# Patient Record
Sex: Female | Born: 1970
Health system: Southern US, Community
[De-identification: ages and names within clinical notes are randomized; demographics above are authoritative.]

## PROBLEM LIST (undated history)

## (undated) DIAGNOSIS — I10 Essential (primary) hypertension: Secondary | ICD-10-CM

## (undated) DIAGNOSIS — D649 Anemia, unspecified: Secondary | ICD-10-CM

## (undated) DIAGNOSIS — K5190 Ulcerative colitis, unspecified, without complications: Secondary | ICD-10-CM

## (undated) DIAGNOSIS — I214 Non-ST elevation (NSTEMI) myocardial infarction: Secondary | ICD-10-CM

## (undated) DIAGNOSIS — E119 Type 2 diabetes mellitus without complications: Secondary | ICD-10-CM

## (undated) HISTORY — DX: Ulcerative colitis, unspecified, without complications: K51.90

## (undated) HISTORY — DX: Anemia, unspecified: D64.9

## (undated) HISTORY — DX: Type 2 diabetes mellitus without complications: E11.9

## (undated) HISTORY — DX: Essential (primary) hypertension: I10

## (undated) HISTORY — DX: Non-ST elevation (NSTEMI) myocardial infarction: I21.4

---

## 2015-08-11 ENCOUNTER — Institutional Professional Consult (permissible substitution): Payer: BLUE CROSS/BLUE SHIELD | Admitting: Family Medicine

## 2015-08-12 ENCOUNTER — Encounter: Payer: Self-pay | Admitting: Family Medicine

## 2015-08-12 ENCOUNTER — Other Ambulatory Visit: Payer: Self-pay | Admitting: Family Medicine

## 2015-08-12 ENCOUNTER — Ambulatory Visit (INDEPENDENT_AMBULATORY_CARE_PROVIDER_SITE_OTHER): Payer: BLUE CROSS/BLUE SHIELD | Admitting: Family Medicine

## 2015-08-12 VITALS — BP 124/84 | HR 68 | Ht 62.0 in | Wt 238.4 lb

## 2015-08-12 DIAGNOSIS — R350 Frequency of micturition: Secondary | ICD-10-CM | POA: Diagnosis not present

## 2015-08-12 DIAGNOSIS — E119 Type 2 diabetes mellitus without complications: Secondary | ICD-10-CM | POA: Diagnosis not present

## 2015-08-12 DIAGNOSIS — Z7689 Persons encountering health services in other specified circumstances: Secondary | ICD-10-CM

## 2015-08-12 DIAGNOSIS — R5383 Other fatigue: Secondary | ICD-10-CM

## 2015-08-12 DIAGNOSIS — Z7189 Other specified counseling: Secondary | ICD-10-CM | POA: Diagnosis not present

## 2015-08-12 DIAGNOSIS — I10 Essential (primary) hypertension: Secondary | ICD-10-CM | POA: Diagnosis not present

## 2015-08-12 DIAGNOSIS — Z862 Personal history of diseases of the blood and blood-forming organs and certain disorders involving the immune mechanism: Secondary | ICD-10-CM | POA: Diagnosis not present

## 2015-08-12 DIAGNOSIS — R7303 Prediabetes: Secondary | ICD-10-CM | POA: Insufficient documentation

## 2015-08-12 LAB — CBC WITH DIFFERENTIAL/PLATELET
BASOS ABS: 0.1 10*3/uL (ref 0.0–0.1)
Basophils Relative: 1 % (ref 0–1)
EOS PCT: 3 % (ref 0–5)
Eosinophils Absolute: 0.3 10*3/uL (ref 0.0–0.7)
HEMATOCRIT: 37.8 % (ref 36.0–46.0)
Hemoglobin: 12 g/dL (ref 12.0–15.0)
LYMPHS ABS: 2.7 10*3/uL (ref 0.7–4.0)
LYMPHS PCT: 32 % (ref 12–46)
MCH: 27 pg (ref 26.0–34.0)
MCHC: 31.7 g/dL (ref 30.0–36.0)
MCV: 85.1 fL (ref 78.0–100.0)
MPV: 11.6 fL (ref 8.6–12.4)
Monocytes Absolute: 0.8 10*3/uL (ref 0.1–1.0)
Monocytes Relative: 9 % (ref 3–12)
NEUTROS ABS: 4.6 10*3/uL (ref 1.7–7.7)
NEUTROS PCT: 55 % (ref 43–77)
Platelets: 427 10*3/uL — ABNORMAL HIGH (ref 150–400)
RBC: 4.44 MIL/uL (ref 3.87–5.11)
RDW: 15.8 % — AB (ref 11.5–15.5)
WBC: 8.4 10*3/uL (ref 4.0–10.5)

## 2015-08-12 LAB — POCT URINALYSIS DIPSTICK
Bilirubin, UA: NEGATIVE
Glucose, UA: NEGATIVE
KETONES UA: NEGATIVE
Leukocytes, UA: NEGATIVE
Nitrite, UA: NEGATIVE
PH UA: 6
PROTEIN UA: NEGATIVE
RBC UA: NEGATIVE
UROBILINOGEN UA: NEGATIVE

## 2015-08-12 LAB — COMPREHENSIVE METABOLIC PANEL
ALK PHOS: 57 U/L (ref 33–115)
ALT: 22 U/L (ref 6–29)
AST: 20 U/L (ref 10–30)
Albumin: 3.9 g/dL (ref 3.6–5.1)
BILIRUBIN TOTAL: 0.3 mg/dL (ref 0.2–1.2)
BUN: 17 mg/dL (ref 7–25)
CALCIUM: 9.4 mg/dL (ref 8.6–10.2)
CO2: 24 mmol/L (ref 20–31)
CREATININE: 0.76 mg/dL (ref 0.50–1.10)
Chloride: 104 mmol/L (ref 98–110)
Glucose, Bld: 163 mg/dL — ABNORMAL HIGH (ref 65–99)
Potassium: 3.9 mmol/L (ref 3.5–5.3)
Sodium: 137 mmol/L (ref 135–146)
TOTAL PROTEIN: 7.4 g/dL (ref 6.1–8.1)

## 2015-08-12 LAB — POCT UA - MICROALBUMIN
Creatinine, POC: 87.5 mg/dL
MICROALBUMIN (UR) POC: 7 mg/L

## 2015-08-12 LAB — TSH: TSH: 1.9 m[IU]/L

## 2015-08-12 LAB — POCT GLYCOSYLATED HEMOGLOBIN (HGB A1C): Hemoglobin A1C: 7

## 2015-08-12 MED ORDER — METFORMIN HCL 500 MG PO TABS: 500.0000 mg | ORAL_TABLET | Freq: Two times a day (BID) | ORAL | Status: DC

## 2015-08-12 NOTE — Patient Instructions (Signed)
Basic Carbohydrate Counting for Diabetes Mellitus Carbohydrate counting is a method for keeping track of the amount of carbohydrates you eat. Eating carbohydrates naturally increases the level of sugar (glucose) in your blood, so it is important for you to know the amount that is okay for you to have in every meal. Carbohydrate counting helps keep the level of glucose in your blood within normal limits. The amount of carbohydrates allowed is different for every person. A dietitian can help you calculate the amount that is right for you. Once you know the amount of carbohydrates you can have, you can count the carbohydrates in the foods you want to eat. Carbohydrates are found in the following foods:  Grains, such as breads and cereals.  Dried beans and soy products.  Starchy vegetables, such as potatoes, peas, and corn.  Fruit and fruit juices.  Milk and yogurt.  Sweets and snack foods, such as cake, cookies, candy, chips, soft drinks, and fruit drinks. CARBOHYDRATE COUNTING There are two ways to count the carbohydrates in your food. You can use either of the methods or a combination of both. Reading the "Nutrition Facts" on Packaged Food The "Nutrition Facts" is an area that is included on the labels of almost all packaged food and beverages in the [place]. It includes the serving size of that food or beverage and information about the nutrients in each serving of the food, including the grams (g) of carbohydrate per serving.  Decide the number of servings of this food or beverage that you will be able to eat or drink. Multiply that number of servings by the number of grams of carbohydrate that is listed on the label for that serving. The total will be the amount of carbohydrates you will be having when you eat or drink this food or beverage. Learning Standard Serving Sizes of Food When you eat food that is not packaged or does not include "Nutrition Facts" on the label, you need to  measure the servings in order to count the amount of carbohydrates.A serving of most carbohydrate-rich foods contains about 15 g of carbohydrates. The following list includes serving sizes of carbohydrate-rich foods that provide 15 g ofcarbohydrate per serving:   1 slice of bread (1 oz) or 1 six-inch tortilla.    of a hamburger bun or English muffin.  4-6 crackers.   cup unsweetened dry cereal.    cup hot cereal.   cup rice or pasta.    cup mashed potatoes or  of a large baked potato.  1 cup fresh fruit or one small piece of fruit.    cup canned or frozen fruit or fruit juice.  1 cup milk.   cup plain fat-free yogurt or yogurt sweetened with artificial sweeteners.   cup cooked dried beans or starchy vegetable, such as peas, corn, or potatoes.  Decide the number of standard-size servings that you will eat. Multiply that number of servings by 15 (the grams of carbohydrates in that serving). For example, if you eat 2 cups of strawberries, you will have eaten 2 servings and 30 g of carbohydrates (2 servings x 15 g = 30 g). For foods such as soups and casseroles, in which more than one food is mixed in, you will need to count the carbohydrates in each food that is included. EXAMPLE OF CARBOHYDRATE COUNTING Sample Dinner  3 oz chicken breast.   cup of brown rice.   cup of corn.  1 cup milk.   1 cup strawberries with   sugar-free whipped topping.  Carbohydrate Calculation Step 1: Identify the foods that contain carbohydrates:   Rice.   Corn.   Milk.   Strawberries. Step 2:Calculate the number of servings eaten of each:   2 servings of rice.   1 serving of corn.   1 serving of milk.   1 serving of strawberries. Step 3: Multiply each of those number of servings by 15 g:   2 servings of rice x 15 g = 30 g.   1 serving of corn x 15 g = 15 g.   1 serving of milk x 15 g = 15 g.   1 serving of strawberries x 15 g = 15 g. Step 4: Add  together all of the amounts to find the total grams of carbohydrates eaten: 30 g + 15 g + 15 g + 15 g = 75 g.   This information is not intended to replace advice given to you by your health care provider. Make sure you discuss any questions you have with your health care provider.   Document Released: 05/29/2005 Document Revised: 06/19/2014 Document Reviewed: 04/25/2013 Elsevier Interactive Patient Education 2016 Elsevier Inc. Diabetes and Exercise Exercising regularly is important. It is not just about losing weight. It has many health benefits, such as:  Improving your overall fitness, flexibility, and endurance.  Increasing your bone density.  Helping with weight control.  Decreasing your body fat.  Increasing your muscle strength.  Reducing stress and tension.  Improving your overall health. People with diabetes who exercise gain additional benefits because exercise:  Reduces appetite.  Improves the body's use of blood sugar (glucose).  Helps lower or control blood glucose.  Decreases blood pressure.  Helps control blood lipids (such as cholesterol and triglycerides).  Improves the body's use of the hormone insulin by:  Increasing the body's insulin sensitivity.  Reducing the body's insulin needs.  Decreases the risk for heart disease because exercising:  Lowers cholesterol and triglycerides levels.  Increases the levels of good cholesterol (such as high-density lipoproteins [HDL]) in the body.  Lowers blood glucose levels. YOUR ACTIVITY PLAN  Choose an activity that you enjoy, and set realistic goals. To exercise safely, you should begin practicing any new physical activity slowly, and gradually increase the intensity of the exercise over time. Your health care provider or diabetes educator can help create an activity plan that works for you. General recommendations include:  Encouraging children to engage in at least 60 minutes of physical activity each  day.  Stretching and performing strength training exercises, such as yoga or weight lifting, at least 2 times per week.  Performing a total of at least 150 minutes of moderate-intensity exercise each week, such as brisk walking or water aerobics.  Exercising at least 3 days per week, making sure you allow no more than 2 consecutive days to pass without exercising.  Avoiding long periods of inactivity (90 minutes or more). When you have to spend an extended period of time sitting down, take frequent breaks to walk or stretch. RECOMMENDATIONS FOR EXERCISING WITH TYPE 1 OR TYPE 2 DIABETES   Check your blood glucose before exercising. If blood glucose levels are greater than 240 mg/dL, check for urine ketones. Do not exercise if ketones are present.  Avoid injecting insulin into areas of the body that are going to be exercised. For example, avoid injecting insulin into:  The arms when playing tennis.  The legs when jogging.  Keep a record of:  Food intake before   and after you exercise.  Expected peak times of insulin action.  Blood glucose levels before and after you exercise.  The type and amount of exercise you have done.  Review your records with your health care provider. Your health care provider will help you to develop guidelines for adjusting food intake and insulin amounts before and after exercising.  If you take insulin or oral hypoglycemic agents, watch for signs and symptoms of hypoglycemia. They include:  Dizziness.  Shaking.  Sweating.  Chills.  Confusion.  Drink plenty of water while you exercise to prevent dehydration or heat stroke. Body water is lost during exercise and must be replaced.  Talk to your health care provider before starting an exercise program to make sure it is safe for you. Remember, almost any type of activity is better than none.   This information is not intended to replace advice given to you by your health care provider. Make sure you  discuss any questions you have with your health care provider.   Document Released: 08/19/2003 Document Revised: 10/13/2014 Document Reviewed: 11/05/2012 Elsevier Interactive Patient Education 2016 Elsevier Inc.  

## 2015-08-12 NOTE — Progress Notes (Signed)
   Subjective:    Patient ID: Courtney Stewart, female    DOB: 01-19-1971, 45 y.o.   MRN: [id number]  HPI Chief Complaint  Patient presents with  . new pt    new pt. was told she had pre-diabetes. urinary frquency, jitterly if don't eat.    She is new to the practice and here to establish care. Last saw her PCP in November 2016 in [place]. She moved here from [facility].  Pre- diabetes for past 24 years. Was told in November that she had an elevated A1C.  Complains of urinary frequency and increased thirst and feeling tired for past 2 months.  Diet: eats a lot of sweets and carbohydrates. No exercise.   Diagnosed with HTN in 2014 and is taking Lisinopril daily.   She states she is a vegetarian. Has history of anemia. She eats out often 2-3 times per week.  Ulcerative colitis- diagnosed in 2015 by GI in [place].   Surgeries: C-secton.   Last pap smear: 2009 overdue.  Reports regular periods- comes on every 28 days.  Bleeds for 2 days then spots for 3-4 days.   Mammogram: never.  Colonoscopy: 2015   Family history: mother deceased and had HTN, DM, hyperlipidemia; father deceased from colon cancer at age 35. 3 brothers all healthy.    Works as a Conservation officer, nature at [employer].  Lives with daughter. She is 45 years old.   Reviewed allergies, medications, past medical, surgical, family, and social history.    Review of Systems Pertinent positives and negatives in the history of present illness.     Objective:   Physical Exam BP 124/84 mmHg  Pulse 68  Ht  (1.575 m)  Wt 238 lb 6.4 oz (108.138 kg)  BMI 43.59 kg/m2  LMP 07/28/2015  Alert and in no distress.  Cardiac exam shows a regular sinus rhythm without murmurs or gallops. Lungs are clear to auscultation.  A1c 7.0     Assessment & Plan:  Diabetes mellitus without complication (HCC) - Plan: CBC with Differential/Platelet, Comprehensive metabolic panel, Amb ref to Medical Nutrition Therapy-MNT  Morbid obesity,  unspecified obesity type (HCC) - Plan: Comprehensive metabolic panel, TSH, Amb ref to Medical Nutrition Therapy-MNT  Encounter to establish care  Essential hypertension - Plan: CBC with Differential/Platelet, Comprehensive metabolic panel, Amb ref to Medical Nutrition Therapy-MNT  History of anemia - Plan: CBC with Differential/Platelet  Other fatigue - Plan: TSH  Urinary frequency - Plan: POCT urinalysis dipstick  Discussed diabetes spectrum and that she now needs to make lifestyle changes in order to prevent the progression of the disease and to prevent heart disease, blindness, kidney disease, stroke, etc. She appear to be motivated to make changes and agrees to go for nutritional counseling. Recommend physical activity such as walking and to start slow and gradually increase in pace and duration. Nutrition referral will be beneficial for obesity, HTN and diabetes.  Discussed multiple differentials for fatigue and will rule out underlying etiologies such as anemia or thyroid dysfunction.  Discussed that her urine is negative for infection and her urinary frequency may be related to elevated glucose. Prescription sent for Metformin bid and discussed possible side effects such as diarrhea or nausea that should subside in a couple of weeks.  She will follow up in 2 weeks or sooner if needed pending labs.

## 2015-08-18 ENCOUNTER — Encounter: Payer: Self-pay | Admitting: Internal Medicine

## 2015-08-24 ENCOUNTER — Encounter: Payer: Self-pay | Admitting: Family Medicine

## 2015-08-24 ENCOUNTER — Ambulatory Visit (INDEPENDENT_AMBULATORY_CARE_PROVIDER_SITE_OTHER): Payer: BLUE CROSS/BLUE SHIELD | Admitting: Family Medicine

## 2015-08-24 ENCOUNTER — Other Ambulatory Visit: Payer: Self-pay | Admitting: Family Medicine

## 2015-08-24 ENCOUNTER — Encounter: Payer: Self-pay | Admitting: Internal Medicine

## 2015-08-24 VITALS — BP 138/90 | HR 64 | Wt 237.4 lb

## 2015-08-24 DIAGNOSIS — I10 Essential (primary) hypertension: Secondary | ICD-10-CM | POA: Diagnosis not present

## 2015-08-24 DIAGNOSIS — E119 Type 2 diabetes mellitus without complications: Secondary | ICD-10-CM | POA: Diagnosis not present

## 2015-08-24 MED ORDER — ONETOUCH DELICA LANCETS FINE MISC: Status: DC

## 2015-08-24 MED ORDER — GLUCOSE BLOOD VI STRP: ORAL_STRIP | Status: AC

## 2015-08-24 NOTE — Progress Notes (Signed)
   Subjective:    Patient ID: Courtney Stewart, female    DOB: [birth year], 45 y.o.   MRN: [id number]  HPI Chief Complaint  Patient presents with  . follow-up    2 follow-up. still tired all the time   She is here for a 2 week follow up to see how she is doing on Metformin and to review labs. She has not scheduled nutritionist appointment yet. States she does not have a telephone. She has signed up for MyChart and would like correspondence through this. Otherwise, in case of emergency her daughter's phone number is listed.  Complains of feeling tired all the time, this is an ongoing complaint for several months and not new since starting the medication. Denies side effects to Metformin such as diarrhea or nausea.   Reports sleeping 7 hours but wakes up to use bathroom 2-3 times per night.  Diet - she states she was eating too much junk food. She has cut back on this. She is a vegetarian. Reports history of ulcerative colitis. Exercise- trying to "dance around the house about 20 minutes at a time". Is not walking or do any other exercise.  Daily schedule- Conservation officer, naturecashier at [employer].   Denies headache, fever, chills, bodyaches, chest pain, palpitations, DOE, GI or GU problems.   Review of Systems Pertinent positives and negatives in the history of present illness.     Objective:   Physical Exam BP 138/90 mmHg  Pulse 64  Wt 237 lb 6.4 oz (107.684 kg)  LMP 07/28/2015  Alert and in no distress. Neck is supple without adenopathy or thyromegaly. Cardiac exam shows a regular sinus rhythm without murmurs or gallops. Lungs are clear to auscultation. Extremities without edema, pulses intact.       Assessment & Plan:  Diabetes mellitus without complication (HCC)  Essential hypertension  Morbid obesity, unspecified obesity type (HCC)  Meter provided with instructions from [name], CMA,  on how to check her blood sugar. She will start checking her fasting blood sugar 2-3 days per week and 2 hours  after a meal couple of times per week and let me know what her readings are.  Discussed that she should schedule her nutritionist appointment to help her with diabetes education. Discussed lifestyle modification as the foundation for diabetes, obesity, hypertension management. Recommend that she schedule an eye exam with an ophthalmologist. Recommend walking and getting a minimum of 150 minutes of physical activity per week. Congratulated her on reducing her sugar intake. Discussed that if her blood pressure is stain elevated greater than 140/90 that she should let me know. No changes made to her medication today. She is overdue for physical and Pap smear. Recommend that she schedule a complete physical exam with Pap in the next couple months. Will need to follow up on diabetes in 3-4 months. She will call me in 2 weeks with her blood sugar readings. Spent a minimum of 25 minutes with patient and 50% of that was in counseling and coordination of care.

## 2015-08-24 NOTE — Addendum Note (Signed)
Addended by: Herminio CommonsJOHNSON, Rayvin Abid A on: 08/24/2015 11:22 AM   Modules accepted: Orders, Medications

## 2015-08-24 NOTE — Patient Instructions (Addendum)
Check your fasting blood sugar, before breakfast, 2-3 times per week and then check your blood sugar 2 hours after lunch or supper 2-3 times per week. Write these numbers down and call and let me know what your readings are in a couple of weeks. If your blood pressure is staying greater than 140/90, let me know  Schedule an eye exam with an ophthalmologist for a complete eye exam.  Start taking a multivitamin once daily and 1000 IU of vitamin D-- [employer] is an approved brand.   Schedule your appointment with the nutritionist. Make sure you are getting 150 minutes of physical activity per week and keep watching her carbs and sugar intake.

## 2015-09-01 ENCOUNTER — Encounter (HOSPITAL_COMMUNITY): Payer: Self-pay | Admitting: *Deleted

## 2015-09-01 ENCOUNTER — Emergency Department (HOSPITAL_COMMUNITY)
Admission: EM | Admit: 2015-09-01 | Discharge: 2015-09-01 | Disposition: A | Discharge status: Home / Self Care | Payer: BLUE CROSS/BLUE SHIELD | Attending: Emergency Medicine | Admitting: Emergency Medicine

## 2015-09-01 DIAGNOSIS — Z7984 Long term (current) use of oral hypoglycemic drugs: Secondary | ICD-10-CM | POA: Diagnosis not present

## 2015-09-01 DIAGNOSIS — Z79899 Other long term (current) drug therapy: Secondary | ICD-10-CM | POA: Insufficient documentation

## 2015-09-01 DIAGNOSIS — I10 Essential (primary) hypertension: Secondary | ICD-10-CM | POA: Diagnosis present

## 2015-09-01 DIAGNOSIS — R251 Tremor, unspecified: Secondary | ICD-10-CM | POA: Insufficient documentation

## 2015-09-01 DIAGNOSIS — E119 Type 2 diabetes mellitus without complications: Secondary | ICD-10-CM

## 2015-09-01 DIAGNOSIS — Z862 Personal history of diseases of the blood and blood-forming organs and certain disorders involving the immune mechanism: Secondary | ICD-10-CM | POA: Insufficient documentation

## 2015-09-01 DIAGNOSIS — R110 Nausea: Secondary | ICD-10-CM | POA: Diagnosis not present

## 2015-09-01 DIAGNOSIS — L988 Other specified disorders of the skin and subcutaneous tissue: Secondary | ICD-10-CM | POA: Insufficient documentation

## 2015-09-01 DIAGNOSIS — Z8719 Personal history of other diseases of the digestive system: Secondary | ICD-10-CM | POA: Diagnosis not present

## 2015-09-01 LAB — BASIC METABOLIC PANEL
Anion gap: 11 (ref 5–15)
BUN: 9 mg/dL (ref 6–20)
CHLORIDE: 101 mmol/L (ref 101–111)
CO2: 28 mmol/L (ref 22–32)
CREATININE: 0.84 mg/dL (ref 0.44–1.00)
Calcium: 10.5 mg/dL — ABNORMAL HIGH (ref 8.9–10.3)
GFR calc non Af Amer: >60 mL/min (ref >60)
Glucose, Bld: 84 mg/dL (ref 65–99)
POTASSIUM: 4.2 mmol/L (ref 3.5–5.1)
SODIUM: 140 mmol/L (ref 135–145)

## 2015-09-01 LAB — CBC WITH DIFFERENTIAL/PLATELET
Basophils Absolute: 0 10*3/uL (ref 0.0–0.1)
Basophils Relative: 0 %
EOS ABS: 0.2 10*3/uL (ref 0.0–0.7)
Eosinophils Relative: 2 %
HEMATOCRIT: 39.3 % (ref 36.0–46.0)
HEMOGLOBIN: 12.6 g/dL (ref 12.0–15.0)
LYMPHS ABS: 3.1 10*3/uL (ref 0.7–4.0)
Lymphocytes Relative: 37 %
MCH: 27.6 pg (ref 26.0–34.0)
MCHC: 32.1 g/dL (ref 30.0–36.0)
MCV: 86.2 fL (ref 78.0–100.0)
MONOS PCT: 8 %
Monocytes Absolute: 0.7 10*3/uL (ref 0.1–1.0)
NEUTROS ABS: 4.5 10*3/uL (ref 1.7–7.7)
Neutrophils Relative %: 53 %
Platelets: 359 10*3/uL (ref 150–400)
RBC: 4.56 MIL/uL (ref 3.87–5.11)
RDW: 14.5 % (ref 11.5–15.5)
WBC: 8.5 10*3/uL (ref 4.0–10.5)

## 2015-09-01 LAB — URINALYSIS, ROUTINE W REFLEX MICROSCOPIC
Bilirubin Urine: NEGATIVE
GLUCOSE, UA: NEGATIVE mg/dL
HGB URINE DIPSTICK: NEGATIVE
Ketones, ur: NEGATIVE mg/dL
LEUKOCYTES UA: NEGATIVE
Nitrite: NEGATIVE
PH: 6.5 (ref 5.0–8.0)
PROTEIN: NEGATIVE mg/dL
SPECIFIC GRAVITY, URINE: 1.01 (ref 1.005–1.030)

## 2015-09-01 NOTE — ED Notes (Signed)
Pt still not in room. Josh Pa notified. Will take pt out of computer.

## 2015-09-01 NOTE — ED Provider Notes (Signed)
CSN: [id number]     Arrival date & time 09/01/15  1557 History   First MD Initiated Contact with Patient 09/01/15 1558     Chief Complaint  Patient presents with  . Hypertension     (Consider location/radiation/quality/duration/timing/severity/associated sxs/prior Treatment) HPI Comments: Patient with history of diabetes on metformin, hypertension, ulcerative colitis -- presents with complaint of multiple episodes of feeling jittery today with increased appetite. Patient felt these throughout the day but was very jittery while she was at work this afternoon which caused her to come to the emergency department. Blood pressure was found to be elevated. Patient denies any headache, vision change, chest pain, shortness of breath. She stated her heart felt tight for a few seconds when her BP was high. No radiation of pain, exertional pain, vomiting or diaphoresis. No increased or decreased urination. She denies any recent fevers, chills, cold symptoms, vomiting, or diarrhea. No urinary symptoms or skin rashes. Patient did have some muscle cramps in her bilateral legs yesterday without swelling of her legs. Onset of symptoms acute. Course is constant. Patient was started on metformin several weeks ago. Thyroid studies ordered by PCP were normal. A1c was 7.0.   The history is provided by the patient.    Past Medical History  Diagnosis Date  . Diabetes mellitus without complication (HCC)   . Anemia   . Hypertension   . UC (ulcerative colitis) [facility])    Past Surgical History  Procedure Laterality Date  . Cesarean section     Family History  Problem Relation Age of Onset  . Diabetes Mother   . Hyperlipidemia Mother   . Hypertension Mother   . Cancer Father    Social History  Substance Use Topics  . Smoking status: Never Smoker   . Smokeless tobacco: None  . Alcohol Use: No   OB History    No data available     Review of Systems  Constitutional: Negative for fever.  HENT: Negative  for congestion, dental problem, rhinorrhea, sinus pressure and sore throat.   Eyes: Negative for photophobia, discharge, redness and visual disturbance.  Respiratory: Negative for cough and shortness of breath.   Cardiovascular: Positive for chest pain (tightness in chest when BP was high, resolved).  Gastrointestinal: Positive for nausea. Negative for vomiting, abdominal pain and diarrhea.  Genitourinary: Negative for dysuria.  Musculoskeletal: Negative for myalgias, gait problem, neck pain and neck stiffness.  Skin: Negative for rash.  Neurological: Positive for tremors. Negative for syncope, speech difficulty, weakness, light-headedness, numbness and headaches.  Psychiatric/Behavioral: Negative for confusion.    Allergies  Review of patient's allergies indicates no known allergies.  Home Medications   Prior to Admission medications   Medication Sig Start Date End Date Taking? Authorizing Provider  glucose blood test strip Test 4-5 times a week before eating and 3-4 times a week 2 hours after eating. Pt uses- one touch verio IQ meter 08/24/15   Avanell Shackleton, NP  lisinopril (PRINIVIL,ZESTRIL) 20 MG tablet Take 20 mg by mouth daily.    Historical Provider, MD  metFORMIN (GLUCOPHAGE) 500 MG tablet TAKE 1 TABLET BY MOUTH TWICE DAILY WITH A MEAL 08/12/15   Vickie L Henson, NP  ONETOUCH DELICA LANCETS FINE MISC TEST 4 TO 5 TMES A WEEK BEFORE EATING AND 3 TO 4 TIMES A WEEK, 2 HOURS AFTER EATING 08/24/15   Avanell Shackleton, NP   BP 156/97 mmHg  Temp(Src) 98.4 F (36.9 C) (Oral)  Resp 18  Ht  (1.575  m)  Wt 108.863 kg  BMI 43.89 kg/m2  SpO2 100%  LMP 07/28/2015   Physical Exam  Constitutional: She appears well-developed and well-nourished.  HENT:  Head: Normocephalic and atraumatic.  Mouth/Throat: Mucous membranes are normal. Mucous membranes are not dry.  Eyes: Conjunctivae are normal. Right eye exhibits no discharge. Left eye exhibits no discharge.  Neck: Trachea normal and  normal range of motion. Neck supple. Normal carotid pulses and no JVD present. No muscular tenderness present. Carotid bruit is not present. No tracheal deviation present.  Cardiovascular: Normal rate, regular rhythm, S1 normal, S2 normal, normal heart sounds and intact distal pulses.  Exam reveals no decreased pulses.   No murmur heard. Pulmonary/Chest: Effort normal and breath sounds normal. No respiratory distress. She has no wheezes. She exhibits no tenderness.  Abdominal: Soft. Normal aorta and bowel sounds are normal. There is no tenderness. There is no rebound and no guarding.  Musculoskeletal: Normal range of motion.  Neurological: She is alert.  Skin: Skin is warm and dry. She is not diaphoretic. No cyanosis. No pallor.  Dry scaly skin on bilateral ankles and feet.   Psychiatric: She has a normal mood and affect.  Nursing note and vitals reviewed.   ED Course  Procedures (including critical care time) Labs Review Labs Reviewed  BASIC METABOLIC PANEL - Abnormal; Notable for the following:    Calcium 10.5 (*)    All other components within normal limits  CBC WITH DIFFERENTIAL/PLATELET  URINALYSIS, ROUTINE W REFLEX MICROSCOPIC (NOT [facility])    Imaging Review No results found. I have personally reviewed and evaluated these images and lab results as part of my medical decision-making.   EKG Interpretation   Date/Time:  Wednesday September 01 2015 17:41:45 EDT Ventricular Rate:  66 PR Interval:  165 QRS Duration: 99 QT Interval:  403 QTC Calculation: 422 R Axis:   60 Text Interpretation:  Sinus rhythm No old tracing to compare Confirmed by  KNAPP  MD-J, JON ([id number]) on 09/01/2015 5:48:41 PM       5:09 PM Patient seen and examined. Work-up initiated. Medications ordered.   Vital signs reviewed and are as follows: BP 156/97 mmHg  Temp(Src) 98.4 F (36.9 C) (Oral)  Resp 18  Ht 5\' 2"  (1.575 m)  Wt 108.863 kg  BMI 43.89 kg/m2  SpO2 100%  LMP 07/28/2015  8:30 PM  Reviewed lab results with patient and daughter at bedside.   Encouraged PCP follow-up with continued use of her anti-hypertensives and antihyperglycemic medications.  Patient urged to return with worsening symptoms or other concerns. Patient verbalized understanding and agrees with plan.   Informed by nurse that patient has left before getting discharge paperwork.    MDM   Final diagnoses:  Essential hypertension  Type 2 diabetes mellitus without complication, without long-term current use of insulin (HCC)   Patient with vague episodes of jitteriness, appetite change, HTN. Work-up here unremarkable. EKG   Normal. Metformin is a new medication for patient, may be related to swelling and blood sugar. Certainly no neuro deficits noted on exam today. Patient appears well, nontoxic. She can follow-up with her PCP for further management. Encouraged to return with worsening or changing symptoms or other concerns.   Renne CriglerJoshua Drinda Belgard, PA-C 09/01/15 2033  Linwood DibblesJon Knapp, MD 09/02/15 [phone]

## 2015-09-01 NOTE — ED Notes (Signed)
Pts monitoring equipment on bed, pt and family member not in room.

## 2015-09-01 NOTE — ED Notes (Signed)
Pt given crackers.  

## 2015-09-01 NOTE — ED Notes (Signed)
Per [facility] Pt woke up at 7:00 am feeling jittery and shaky. She also experienced tightness in the back of both legs last night.  Pt went to work and continued to feel jittery and shaky.  Pt has HTN and takes lisinopril, and may have recently missed a dose.  VS are as follows: in L arm BP: 204/104 R arm BP: 180/110 CBG: 125 SPO2: 99% on RA HR: 76 Resp: 18

## 2015-09-10 ENCOUNTER — Other Ambulatory Visit: Payer: Self-pay | Admitting: Family Medicine

## 2015-09-14 ENCOUNTER — Encounter: Payer: BLUE CROSS/BLUE SHIELD | Attending: Family Medicine

## 2015-09-14 VITALS — Ht 64.0 in | Wt 236.8 lb

## 2015-09-14 DIAGNOSIS — E119 Type 2 diabetes mellitus without complications: Secondary | ICD-10-CM | POA: Insufficient documentation

## 2015-09-14 DIAGNOSIS — I10 Essential (primary) hypertension: Secondary | ICD-10-CM | POA: Diagnosis present

## 2015-09-21 ENCOUNTER — Ambulatory Visit: Payer: Self-pay

## 2015-09-26 NOTE — Progress Notes (Signed)

## 2015-09-27 ENCOUNTER — Telehealth: Payer: Self-pay | Admitting: Family Medicine

## 2015-09-27 NOTE — Telephone Encounter (Signed)
[facility] received [facility]. Sending back for review. Please note what needs to be abstracted or scanned.

## 2015-09-28 ENCOUNTER — Ambulatory Visit: Payer: Self-pay

## 2015-10-05 ENCOUNTER — Other Ambulatory Visit: Payer: Self-pay | Admitting: Family Medicine

## 2015-10-05 MED ORDER — METFORMIN HCL 500 MG PO TABS: ORAL_TABLET | ORAL | Status: DC

## 2015-10-05 NOTE — Addendum Note (Signed)
Addended by: Herminio CommonsJOHNSON, SABRINA A on: 10/05/2015 02:22 PM   Modules accepted: Orders

## 2015-12-17 ENCOUNTER — Telehealth: Payer: Self-pay

## 2015-12-17 ENCOUNTER — Encounter: Payer: Self-pay | Admitting: Family Medicine

## 2015-12-17 ENCOUNTER — Ambulatory Visit (INDEPENDENT_AMBULATORY_CARE_PROVIDER_SITE_OTHER): Payer: BLUE CROSS/BLUE SHIELD | Admitting: Family Medicine

## 2015-12-17 VITALS — BP 170/100 | HR 77 | Resp 18 | Ht 62.0 in | Wt 222.0 lb

## 2015-12-17 DIAGNOSIS — K5900 Constipation, unspecified: Secondary | ICD-10-CM

## 2015-12-17 DIAGNOSIS — E119 Type 2 diabetes mellitus without complications: Secondary | ICD-10-CM

## 2015-12-17 DIAGNOSIS — Z86010 Personal history of colonic polyps: Secondary | ICD-10-CM

## 2015-12-17 DIAGNOSIS — R1084 Generalized abdominal pain: Secondary | ICD-10-CM | POA: Diagnosis not present

## 2015-12-17 DIAGNOSIS — Z9114 Patient's other noncompliance with medication regimen: Secondary | ICD-10-CM

## 2015-12-17 DIAGNOSIS — I10 Essential (primary) hypertension: Secondary | ICD-10-CM

## 2015-12-17 DIAGNOSIS — K921 Melena: Secondary | ICD-10-CM

## 2015-12-17 LAB — CBC WITH DIFFERENTIAL/PLATELET
BASOS ABS: 71 {cells}/uL (ref 0–200)
Basophils Relative: 1 %
EOS ABS: 142 {cells}/uL (ref 15–500)
Eosinophils Relative: 2 %
HEMATOCRIT: 37.3 % (ref 35.0–45.0)
Hemoglobin: 11.9 g/dL (ref 11.7–15.5)
LYMPHS PCT: 32 %
Lymphs Abs: 2272 cells/uL (ref 850–3900)
MCH: 26.7 pg — AB (ref 27.0–33.0)
MCHC: 31.9 g/dL — ABNORMAL LOW (ref 32.0–36.0)
MCV: 83.8 fL (ref 80.0–100.0)
MONO ABS: 639 {cells}/uL (ref 200–950)
MPV: 11.4 fL (ref 7.5–12.5)
Monocytes Relative: 9 %
Neutro Abs: 3976 cells/uL (ref 1500–7800)
Neutrophils Relative %: 56 %
PLATELETS: 471 10*3/uL — AB (ref 140–400)
RBC: 4.45 MIL/uL (ref 3.80–5.10)
RDW: 15.3 % — AB (ref 11.0–15.0)
WBC: 7.1 10*3/uL (ref 4.0–10.5)

## 2015-12-17 LAB — TSH: TSH: 2.69 m[IU]/L

## 2015-12-17 LAB — POCT GLYCOSYLATED HEMOGLOBIN (HGB A1C): Hemoglobin A1C: 6.3

## 2015-12-17 NOTE — Progress Notes (Signed)
Subjective:    Patient ID: Courtney Stewart, female    DOB: 27-May-1971, 45 y.o.   MRN: [id number]  HPI Chief Complaint  Patient presents with  . Hypertension    160-184 systolic readings at home.   . Abdominal Pain    Bloating, tenderness- hx of colitis. reports constipation.   . Diabetes    blood sugar 103 this am   She is here with complaints of having elevated blood pressures at home for past week. Reports readings at home between 150/70s to 160/100.  States she has been taking Lisinopril 40 mg for the past week. States she took it upon herself to increase her Lisinopril dose from 20 mg once daily to twice daily.  States she was diagnosed with HTN in 2010 and has tried "all the medications" for HTN and has not tolerated many medications.     Denies fever, chills, headache, dizziness, chest pain, palpitations, DOE, orthopnea, LE edema.   Diabetes: has not been back for Diabetes visit as recommended. Reports she is only taking Metformin 500 mg once daily instead of twice as prescribed. Reports checking blood sugar twice daily but does not recall her readings.  Not exercising. Diet is nondiscretionary.   Eye exam overdue   Complains of diffuse stomach pain for past week- states today she passed some stool and it looked like little pebbles. Reports using enema 3 days ago with some success. Has taken mineral oil, Dulcolax and eating prunes. Denies nausea, vomiting.  States she had "a streak "of bright red blood in her stool today.  Reports history of chronic blood in stool, UC and polyps. Is refusing to have rectal exam today. States she [facility] in [facility] and does not have a GI here.    Reviewed allergies, medications, past medical, surgical and social history.   Review of Systems Pertinent positives and negatives in the history of present illness.     Objective:   Physical Exam  Constitutional: She is oriented to person, place, and time. She appears well-developed and  well-nourished. No distress.  Eyes: EOM are normal. Pupils are equal, round, and reactive to light. No scleral icterus.  Neck: Normal range of motion. Neck supple.  Cardiovascular: Normal rate, regular rhythm and normal heart sounds.  Exam reveals no gallop and no friction rub.   No murmur heard. Pulmonary/Chest: Effort normal and breath sounds normal.  Abdominal: Soft. Normal appearance. Bowel sounds are decreased. There is no hepatosplenomegaly. There is generalized tenderness. There is no rigidity, no rebound, no guarding, no CVA tenderness, no tenderness at McBurney's point and negative Murphy's sign.  Diffusely tender with light palpation  Lymphadenopathy:    She has no cervical adenopathy.  Neurological: She is alert and oriented to person, place, and time. She has normal strength and normal reflexes. Gait normal.  Skin: Skin is warm and dry. No pallor.  Psychiatric: She has a normal mood and affect. Her speech is normal and behavior is normal.   BP 170/100 mmHg  Pulse 77  Resp 18  Ht 5\' 2"  (1.575 m)  Wt 222 lb (100.699 kg)  BMI 40.59 kg/m2  SpO2 99%  LMP 12/06/2015  A1c 6.3    Assessment & Plan:  Essential hypertension - Plan: CBC with Differential/Platelet, Comprehensive metabolic panel, TSH  Diabetes mellitus without complication (HCC) - Plan: HgB A1c, Comprehensive metabolic panel, TSH  Morbid obesity, unspecified obesity type (HCC) - Plan: TSH  Generalized abdominal pain - Plan: CBC with Differential/Platelet, Comprehensive metabolic  panel, Ambulatory referral to Gastroenterology, Lipase  Constipation, unspecified constipation type - Plan: Ambulatory referral to Gastroenterology  Blood in stool - Plan: CBC with Differential/Platelet, Ambulatory referral to Gastroenterology  History of colonic polyps - Plan: Ambulatory referral to Gastroenterology  Non compliance w medication regimen  Discussed that she is taking it upon herself to decide how to take her medication  and this is not necessarily a safe way to go about it. Encouraged communication with this provider and other providers to ensure safe and effective management of her conditions.   Review of pharmacy records show that she has not picked up Lisinopril since October 09 2015 and it was for #30. She has 3 refills remaining. Will need to address this with patient at next appointment.   Of[facility] and see if this better manages her blood pressures. She refuses to switch to a different medication. Discussed strict adherence to lifestyle modifications diet and exercise. Follow up in 1 month for HTN.  Diabetes: A1c today is 6.3 and at goal. Continue taking Metformin. Follow up in 4 months for DM. Discussed that morbid obesity places her at greater risk of poorly managed HTN, DM, and overall health and places her at an increased risk of heart disease, stroke, and even death.   Blood in stool: review of medical records from 2015 show that patient had rectal bleeding at that time. She refuses rectal exam today.  Will check CBC and refer to GI.   generalized abdominal pain, constipation: plan to refer to GI.  Advised to try full liquid diet and try Miralax (titrate until she gets results and then back off) and magnesium citrate. Advised that if she cannot pass stool, gas or has vomiting or worsening abdominal pain then she should seek emergency medical care. Educated on symptoms of bowel obstruction.  Follow up pending labs.

## 2015-12-17 NOTE — Patient Instructions (Signed)
Continue checking daily blood pressures at home and report back readings in 1 week. Continue taking Lisinopril 20 mg. I recommend that we add a second blood pressure medication if you are not getting readings <140/90 consistently.  Eat low salt diet. Exercise as tolerated.   For constipation: try taking Miralax and drinking plenty of fluids as we discussed. You can titrate this. You can also try magnesium citrate. I recommend eating a bland diet until you are comfortably passing stool. If you are unable to pass stool or gas or have worsening abdominal pain or vomiting then you should seek immediate medical care. I am referring you to gastroenterology and they will call you to schedule an appointment.  Your A1C today is 6.3.   DASH Eating Plan DASH stands for "Dietary Approaches to Stop Hypertension." The DASH eating plan is a healthy eating plan that has been shown to reduce high blood pressure (hypertension). Additional health benefits may include reducing the risk of type 2 diabetes mellitus, heart disease, and stroke. The DASH eating plan may also help with weight loss. WHAT DO I NEED TO KNOW ABOUT THE DASH EATING PLAN? For the DASH eating plan, you will follow these general guidelines:  Choose foods with a percent daily value for sodium of less than 5% (as listed on the food label).  Use salt-free seasonings or herbs instead of table salt or sea salt.  Check with your health care provider or pharmacist before using salt substitutes.  Eat lower-sodium products, often labeled as "lower sodium" or "no salt added."  Eat fresh foods.  Eat more vegetables, fruits, and low-fat dairy products.  Choose whole grains. Look for the word "whole" as the first word in the ingredient list.  Choose fish and skinless chicken or [place] more often than red meat. Limit fish, poultry, and meat to 6 oz (170 g) each day.  Limit sweets, desserts, sugars, and sugary drinks.  Choose heart-healthy  fats.  Limit cheese to 1 oz (28 g) per day.  Eat more home-cooked food and less restaurant, buffet, and fast food.  Limit fried foods.  Cook foods using methods other than frying.  Limit canned vegetables. If you do use them, rinse them well to decrease the sodium.  When eating at a restaurant, ask that your food be prepared with less salt, or no salt if possible. WHAT FOODS CAN I EAT? Seek help from a dietitian for individual calorie needs. Grains Whole grain or whole wheat bread. Brown rice. Whole grain or whole wheat pasta. Quinoa, bulgur, and whole grain cereals. Low-sodium cereals. Corn or whole wheat flour tortillas. Whole grain cornbread. Whole grain crackers. Low-sodium crackers. Vegetables Fresh or frozen vegetables (raw, steamed, roasted, or grilled). Low-sodium or reduced-sodium tomato and vegetable juices. Low-sodium or reduced-sodium tomato sauce and paste. Low-sodium or reduced-sodium canned vegetables.  Fruits All fresh, canned (in natural juice), or frozen fruits. Meat and Other Protein Products Ground beef (85% or leaner), grass-fed beef, or beef trimmed of fat. Skinless chicken or [place]. Ground chicken or [place]key. Pork trimmed of fat. All fish and seafood. Eggs. Dried beans, peas, or lentils. Unsalted nuts and seeds. Unsalted canned beans. Dairy Low-fat dairy products, such as skim or 1% milk, 2% or reduced-fat cheeses, low-fat ricotta or cottage cheese, or plain low-fat yogurt. Low-sodium or reduced-sodium cheeses. Fats and Oils Tub margarines without trans fats. Light or reduced-fat mayonnaise and salad dressings (reduced sodium). Avocado. Safflower, olive, or canola oils. Natural peanut or almond butter. Other Unsalted popcorn and  pretzels. The items listed above may not be a complete list of recommended foods or beverages. Contact your dietitian for more options. WHAT FOODS ARE NOT RECOMMENDED? Grains White bread. White pasta. White rice. Refined cornbread.  Bagels and croissants. Crackers that contain trans fat. Vegetables Creamed or fried vegetables. Vegetables in a cheese sauce. Regular canned vegetables. Regular canned tomato sauce and paste. Regular tomato and vegetable juices. Fruits Dried fruits. Canned fruit in light or heavy syrup. Fruit juice. Meat and Other Protein Products Fatty cuts of meat. Ribs, chicken wings, bacon, sausage, bologna, salami, chitterlings, fatback, hot dogs, bratwurst, and packaged luncheon meats. Salted nuts and seeds. Canned beans with salt. Dairy Whole or 2% milk, cream, half-and-half, and cream cheese. Whole-fat or sweetened yogurt. Full-fat cheeses or blue cheese. Nondairy creamers and whipped toppings. Processed cheese, cheese spreads, or cheese curds. Condiments Onion and garlic salt, seasoned salt, table salt, and sea salt. Canned and packaged gravies. Worcestershire sauce. Tartar sauce. Barbecue sauce. Teriyaki sauce. Soy sauce, including reduced sodium. Steak sauce. Fish sauce. Oyster sauce. Cocktail sauce. Horseradish. Ketchup and mustard. Meat flavorings and tenderizers. Bouillon cubes. Hot sauce. Tabasco sauce. Marinades. Taco seasonings. Relishes. Fats and Oils Butter, stick margarine, lard, shortening, ghee, and bacon fat. Coconut, palm kernel, or palm oils. Regular salad dressings. Other Pickles and olives. Salted popcorn and pretzels. The items listed above may not be a complete list of foods and beverages to avoid. Contact your dietitian for more information. WHERE CAN I FIND MORE INFORMATION? National Heart, Lung, and Blood Institute: [employer]   This information is not intended to replace advice given to you by your health care provider. Make sure you discuss any questions you have with your health care provider.   Document Released: 05/18/2011 Document Revised: 06/19/2014 Document Reviewed: 04/02/2013 Elsevier Interactive Patient Education 2016 [employer].   Constipation, Adult Constipation is when a person has fewer than three bowel movements a week, has difficulty having a bowel movement, or has stools that are dry, hard, or larger than normal. As people grow older, constipation is more common. A low-fiber diet, not taking in enough fluids, and taking certain medicines may make constipation worse.  CAUSES   Certain medicines, such as antidepressants, pain medicine, iron supplements, antacids, and water pills.   Certain diseases, such as diabetes, irritable bowel syndrome (IBS), thyroid disease, or depression.   Not drinking enough water.   Not eating enough fiber-rich foods.   Stress or travel.   Lack of physical activity or exercise.   Ignoring the urge to have a bowel movement.   Using laxatives too much.  SIGNS AND SYMPTOMS   Having fewer than three bowel movements a week.   Straining to have a bowel movement.   Having stools that are hard, dry, or larger than normal.   Feeling full or bloated.   Pain in the lower abdomen.   Not feeling relief after having a bowel movement.  DIAGNOSIS  Your health care provider will take a medical history and perform a physical exam. Further testing may be done for severe constipation. Some tests may include:  A barium enema X-ray to examine your rectum, colon, and, sometimes, your small intestine.   A sigmoidoscopy to examine your lower colon.   A colonoscopy to examine your entire colon. TREATMENT  Treatment will depend on the severity of your constipation and what is causing it. Some dietary treatments include drinking more fluids and eating more fiber-rich foods. Lifestyle treatments may include regular exercise. If  these diet and lifestyle recommendations do not help, your health care provider may recommend taking over-the-counter laxative medicines to help you have bowel movements. Prescription medicines may be prescribed if over-the-counter medicines do not  work.  HOME CARE INSTRUCTIONS   Eat foods that have a lot of fiber, such as fruits, vegetables, whole grains, and beans.  Limit foods high in fat and processed sugars, such as french fries, hamburgers, cookies, candies, and soda.   A fiber supplement may be added to your diet if you cannot get enough fiber from foods.   Drink enough fluids to keep your urine clear or pale yellow.   Exercise regularly or as directed by your health care provider.   Go to the restroom when you have the urge to go. Do not hold it.   Only take over-the-counter or prescription medicines as directed by your health care provider. Do not take other medicines for constipation without talking to your health care provider first.  SEEK IMMEDIATE MEDICAL CARE IF:   You have bright red blood in your stool.   Your constipation lasts for more than 4 days or gets worse.   You have abdominal or rectal pain.   You have thin, pencil-like stools.   You have unexplained weight loss. MAKE SURE YOU:   Understand these instructions.  Will watch your condition.  Will get help right away if you are not doing well or get worse.   This information is not intended to replace advice given to you by your health care provider. Make sure you discuss any questions you have with your health care provider.   Document Released: 02/25/2004 Document Revised: 06/19/2014 Document Reviewed: 03/10/2013 Elsevier Interactive Patient Education [employer]2016 Elsevier Inc.

## 2015-12-17 NOTE — Progress Notes (Deleted)
  Subjective:    Patient ID: Courtney Stewart, female    DOB: 1971-01-10, 45 y.o.   MRN: [id number]  Courtney Stewart is a 45 y.o. female who presents for follow-up of Type 2 diabetes mellitus.  Patient is checking home blood sugars.   Home blood sugar records: BGs range between 103 and 112 How often is blood sugars being checked: every morning and after work Current symptoms include: none. Patient denies diabetic sx. .  Patient is checking their feet daily. Any Foot concerns (callous, ulcer, wound, thickened nails, toenail fungus, skin fungus, hammer toe): nothing new- just callous ( Right foot)  Last dilated eye exam years ago.- waiting until Oct when Ins changes   Current treatments: {dm interventions:14074}. Medication compliance: {good/fair/poor:33178}  Current diet: {diet habits:16563} Current exercise: {exercise types:16438} Known diabetic complications: {diabetes complications:1215}  The following portions of the patient's history were reviewed and updated as appropriate: allergies, current medications, past medical history, past social history and problem list.  ROS as in subjective above.     Objective:    Physical Exam Alert and in no distress otherwise not examined.  There were no vitals taken for this visit.  Lab Review Diabetic Labs Latest Ref Rng 09/01/2015 08/12/2015  HbA1c - - 7.0  Creatinine 0.44 - 1.00 mg/dL 7.820.84 9.560.76   BP/Weight 2/1/30864/09/2015 09/01/2015 08/24/2015 08/12/2015  Systolic BP - 165 138 124  Diastolic BP - 80 90 84  Wt. (Lbs) 236.8 240 237.4 238.4  BMI 40.63 43.89 43.41 43.59   No flowsheet data found.  Courtney Stewart  reports that she has never smoked. She does not have any smokeless tobacco history on file. She reports that she does not drink alcohol or use illicit drugs.     Assessment & Plan:    No diagnosis found.  1. Rx changes: {none:33079} 2. Education: Reviewed 'ABCs' of diabetes management (respective goals in parentheses):  A1C (<7), blood pressure  (<130/80), and cholesterol (LDL <100). 3. Compliance at present is estimated to be {good/fair/poor:33178}. Efforts to improve compliance (if necessary) will be directed at {compliance:16716}. 4. Follow up: {NUMBERS; 0-10:33138} {time:11}

## 2015-12-17 NOTE — Telephone Encounter (Signed)
Due to multiple reports of how pt is taking her Lisinopril and elevated BP in office today- contacted [facility] to get drug profile faxed to office to verfiy what medications pt is taking. [facility] a [facility] in [facility] from June 2016- March 2017 where she had no medications filled there. Trixie Rude/RLB

## 2015-12-18 LAB — COMPREHENSIVE METABOLIC PANEL
ALT: 17 U/L (ref 6–29)
AST: 14 U/L (ref 10–30)
Albumin: 4.2 g/dL (ref 3.6–5.1)
Alkaline Phosphatase: 58 U/L (ref 33–115)
BUN: 14 mg/dL (ref 7–25)
CALCIUM: 9.7 mg/dL (ref 8.6–10.2)
CHLORIDE: 104 mmol/L (ref 98–110)
CO2: 22 mmol/L (ref 20–31)
CREATININE: 1 mg/dL (ref 0.50–1.10)
Glucose, Bld: 91 mg/dL (ref 65–99)
Potassium: 4.3 mmol/L (ref 3.5–5.3)
Sodium: 138 mmol/L (ref 135–146)
TOTAL PROTEIN: 7.6 g/dL (ref 6.1–8.1)
Total Bilirubin: 0.3 mg/dL (ref 0.2–1.2)

## 2015-12-18 LAB — LIPASE: LIPASE: 66 U/L — AB (ref 7–60)

## 2015-12-21 ENCOUNTER — Emergency Department (HOSPITAL_COMMUNITY): Payer: BLUE CROSS/BLUE SHIELD

## 2015-12-21 ENCOUNTER — Inpatient Hospital Stay (HOSPITAL_COMMUNITY)
Admission: EM | Admit: 2015-12-21 | Discharge: 2015-12-22 | DRG: 287 | Disposition: A | Discharge status: Home / Self Care | Payer: BLUE CROSS/BLUE SHIELD | Attending: Internal Medicine | Admitting: Internal Medicine

## 2015-12-21 ENCOUNTER — Encounter (HOSPITAL_COMMUNITY): Payer: Self-pay | Admitting: Emergency Medicine

## 2015-12-21 DIAGNOSIS — Z91018 Allergy to other foods: Secondary | ICD-10-CM

## 2015-12-21 DIAGNOSIS — E669 Obesity, unspecified: Secondary | ICD-10-CM | POA: Diagnosis present

## 2015-12-21 DIAGNOSIS — Z8249 Family history of ischemic heart disease and other diseases of the circulatory system: Secondary | ICD-10-CM

## 2015-12-21 DIAGNOSIS — Z79899 Other long term (current) drug therapy: Secondary | ICD-10-CM | POA: Diagnosis not present

## 2015-12-21 DIAGNOSIS — Z833 Family history of diabetes mellitus: Secondary | ICD-10-CM | POA: Diagnosis not present

## 2015-12-21 DIAGNOSIS — Z91010 Allergy to peanuts: Secondary | ICD-10-CM | POA: Diagnosis not present

## 2015-12-21 DIAGNOSIS — R079 Chest pain, unspecified: Secondary | ICD-10-CM

## 2015-12-21 DIAGNOSIS — Z6839 Body mass index (BMI) 39.0-39.9, adult: Secondary | ICD-10-CM | POA: Diagnosis not present

## 2015-12-21 DIAGNOSIS — I200 Unstable angina: Secondary | ICD-10-CM | POA: Diagnosis present

## 2015-12-21 DIAGNOSIS — Z87891 Personal history of nicotine dependence: Secondary | ICD-10-CM | POA: Diagnosis not present

## 2015-12-21 DIAGNOSIS — K5190 Ulcerative colitis, unspecified, without complications: Secondary | ICD-10-CM | POA: Diagnosis present

## 2015-12-21 DIAGNOSIS — Z7984 Long term (current) use of oral hypoglycemic drugs: Secondary | ICD-10-CM

## 2015-12-21 DIAGNOSIS — R8271 Bacteriuria: Secondary | ICD-10-CM | POA: Diagnosis present

## 2015-12-21 DIAGNOSIS — E119 Type 2 diabetes mellitus without complications: Secondary | ICD-10-CM

## 2015-12-21 DIAGNOSIS — I214 Non-ST elevation (NSTEMI) myocardial infarction: Secondary | ICD-10-CM | POA: Diagnosis present

## 2015-12-21 DIAGNOSIS — D649 Anemia, unspecified: Secondary | ICD-10-CM | POA: Diagnosis present

## 2015-12-21 DIAGNOSIS — Z91013 Allergy to seafood: Secondary | ICD-10-CM | POA: Diagnosis not present

## 2015-12-21 DIAGNOSIS — Z91040 Latex allergy status: Secondary | ICD-10-CM

## 2015-12-21 DIAGNOSIS — I10 Essential (primary) hypertension: Secondary | ICD-10-CM | POA: Diagnosis present

## 2015-12-21 DIAGNOSIS — N390 Urinary tract infection, site not specified: Secondary | ICD-10-CM | POA: Diagnosis not present

## 2015-12-21 LAB — I-STAT TROPONIN, ED: Troponin i, poc: 0 ng/mL (ref 0.00–0.08)

## 2015-12-21 LAB — BASIC METABOLIC PANEL
ANION GAP: 7 (ref 5–15)
BUN: 11 mg/dL (ref 6–20)
CHLORIDE: 106 mmol/L (ref 101–111)
CO2: 25 mmol/L (ref 22–32)
Calcium: 9.5 mg/dL (ref 8.9–10.3)
Creatinine, Ser: 0.88 mg/dL (ref 0.44–1.00)
Glucose, Bld: 89 mg/dL (ref 65–99)
POTASSIUM: 4.1 mmol/L (ref 3.5–5.1)
SODIUM: 138 mmol/L (ref 135–145)

## 2015-12-21 LAB — URINALYSIS, ROUTINE W REFLEX MICROSCOPIC
Bilirubin Urine: NEGATIVE
Glucose, UA: NEGATIVE mg/dL
Hgb urine dipstick: NEGATIVE
Ketones, ur: NEGATIVE mg/dL
NITRITE: NEGATIVE
PROTEIN: NEGATIVE mg/dL
SPECIFIC GRAVITY, URINE: 1.005 (ref 1.005–1.030)
pH: 5.5 (ref 5.0–8.0)

## 2015-12-21 LAB — CBC
HEMATOCRIT: 40.2 % (ref 36.0–46.0)
HEMOGLOBIN: 12.9 g/dL (ref 12.0–15.0)
MCH: 27.4 pg (ref 26.0–34.0)
MCHC: 32.1 g/dL (ref 30.0–36.0)
MCV: 85.4 fL (ref 78.0–100.0)
Platelets: 418 10*3/uL — ABNORMAL HIGH (ref 150–400)
RBC: 4.71 MIL/uL (ref 3.87–5.11)
RDW: 14.5 % (ref 11.5–15.5)
WBC: 7 10*3/uL (ref 4.0–10.5)

## 2015-12-21 LAB — URINE MICROSCOPIC-ADD ON

## 2015-12-21 LAB — PREGNANCY, URINE: PREG TEST UR: NEGATIVE

## 2015-12-21 LAB — CBG MONITORING, ED: GLUCOSE-CAPILLARY: 94 mg/dL (ref 65–99)

## 2015-12-21 LAB — TROPONIN I: TROPONIN I: 0.03 ng/mL — AB (ref <0.03)

## 2015-12-21 LAB — D-DIMER, QUANTITATIVE (NOT AT ARMC): D DIMER QUANT: 0.39 ug{FEU}/mL (ref 0.00–0.50)

## 2015-12-21 MED ORDER — NITROGLYCERIN 0.4 MG SL SUBL: 0.4000 mg | SUBLINGUAL_TABLET | SUBLINGUAL | Status: DC | PRN

## 2015-12-21 MED ORDER — ASPIRIN EC 81 MG PO TBEC
81.0000 mg | DELAYED_RELEASE_TABLET | Freq: Every day | ORAL | Status: DC
  Administered 2015-12-22: 81 mg via ORAL
  Filled 2015-12-21: qty 1

## 2015-12-21 MED ORDER — ATORVASTATIN CALCIUM 10 MG PO TABS: 10.0000 mg | ORAL_TABLET | Freq: Every day | ORAL | Status: DC

## 2015-12-21 MED ORDER — HEPARIN SODIUM (PORCINE) 5000 UNIT/ML IJ SOLN
4000.0000 [IU] | Freq: Once | INTRAMUSCULAR | Status: AC
  Administered 2015-12-21: 4000 [IU] via INTRAVENOUS
  Filled 2015-12-21: qty 0.8

## 2015-12-21 MED ORDER — ONDANSETRON HCL 4 MG/2ML IJ SOLN: 4.0000 mg | Freq: Four times a day (QID) | INTRAMUSCULAR | Status: DC | PRN

## 2015-12-21 MED ORDER — ASPIRIN 81 MG PO CHEW
324.0000 mg | CHEWABLE_TABLET | Freq: Once | ORAL | Status: AC
Start: 2015-12-21 — End: 2015-12-21
  Administered 2015-12-21: 324 mg via ORAL
  Filled 2015-12-21: qty 4

## 2015-12-21 MED ORDER — LISINOPRIL 40 MG PO TABS
40.0000 mg | ORAL_TABLET | Freq: Every day | ORAL | Status: DC
  Administered 2015-12-22: 40 mg via ORAL
  Filled 2015-12-21: qty 2
  Filled 2015-12-21: qty 1

## 2015-12-21 MED ORDER — DEXTROSE 5 % IV SOLN
1.0000 g | INTRAVENOUS | Status: DC
  Administered 2015-12-22: 1 g via INTRAVENOUS
  Filled 2015-12-21: qty 10

## 2015-12-21 MED ORDER — ACETAMINOPHEN 325 MG PO TABS: 650.0000 mg | ORAL_TABLET | ORAL | Status: DC | PRN

## 2015-12-21 MED ORDER — INSULIN ASPART 100 UNIT/ML ~~LOC~~ SOLN: 0.0000 [IU] | SUBCUTANEOUS | Status: DC

## 2015-12-21 MED ORDER — HEPARIN (PORCINE) IN NACL 100-0.45 UNIT/ML-% IJ SOLN
1050.0000 [IU]/h | INTRAMUSCULAR | Status: DC
  Administered 2015-12-21: 900 [IU]/h via INTRAVENOUS
  Filled 2015-12-21: qty 250

## 2015-12-21 NOTE — ED Provider Notes (Signed)
Emergency Department Provider Note  Time seen: Approximately 7:41 PM  I have reviewed the triage vital signs and the nursing notes.   HISTORY  Chief Complaint Hypoglycemia and Chest Pain   HPI Courtney Stewart is a 45 y.o. female with PMH of HTN, DM, and obesity who presents to the emergency department for evaluation of 2 episodes of sudden, sharp chest pain while at work. The patient states at approximately 3 PM she had sudden onset sharp piercing chest pain that radiated from her left anterior chest or back. She denies associated difficulty breathing or pleuritic pain. No prior history of heart attack or stroke. No history of blood clots and is not anticoagulated. She denies associated fever or shaking chills. She took a rest and pain subsided and approximately 1 minute. No diaphoresis or vomiting. She had one additional brief sharp pain episode while in the emergency department. No associated palpitations.  Past Medical History  Diagnosis Date  . Diabetes mellitus without complication (HCC)   . Anemia   . Hypertension   . UC (ulcerative colitis) [facility](HCC)     Patient Active Problem List   Diagnosis Date Noted  . NSTEMI (non-ST elevated myocardial infarction) (HCC) 12/21/2015  . UTI (lower urinary tract infection) 12/21/2015  . Morbid obesity (HCC) 08/12/2015  . Diabetes mellitus without complication (HCC) 08/12/2015  . Essential hypertension 08/12/2015    Past Surgical History  Procedure Laterality Date  . Cesarean section      Current Outpatient Rx  Name  Route  Sig  Dispense  Refill  . Charcoal Activated (CHARCOAL PO)   Oral   Take 1 tablet by mouth 2 (two) times daily as needed (stomach pain.).         Marland Kitchen. lisinopril (PRINIVIL,ZESTRIL) 20 MG tablet   Oral   Take 40 mg by mouth daily.          . metFORMIN (GLUCOPHAGE) 500 MG tablet      TAKE 1 TABLET BY MOUTH TWICE DAILY WITH A MEAL Patient taking differently: TAKE 1 TABLET BY MOUTH ONCE DAILY.   180 tablet  1   . glucose blood test strip      Test 4-5 times a week before eating and 3-4 times a week 2 hours after eating. Pt uses- one touch verio IQ meter   100 each   2   . ONETOUCH DELICA LANCETS FINE MISC      TEST 4 TO 5 TMES A WEEK BEFORE EATING AND 3 TO 4 TIMES A WEEK, 2 HOURS AFTER EATING   600 each   1     **Patient requests 90 days supply**     Allergies Latex; Peanut oil; Tomato; Amlodipine; Hydrochlorothiazide w-triamterene; Metoprolol; and Shellfish-derived products  Family History  Problem Relation Age of Onset  . Diabetes Mother   . Hyperlipidemia Mother   . Hypertension Mother   . Cancer Father     Social History Social History  Substance Use Topics  . Smoking status: Never Smoker   . Smokeless tobacco: None  . Alcohol Use: No    Review of Systems  Constitutional: No fever/chills Eyes: No visual changes. ENT: No sore throat. Cardiovascular: Positive chest pain. Respiratory: Positive shortness of breath. Gastrointestinal: No abdominal pain.  No nausea, no vomiting.  No diarrhea.  No constipation. Genitourinary: Negative for dysuria. Musculoskeletal: Negative for back pain. Skin: Negative for rash. Neurological: Negative for headaches, focal weakness or numbness.  10-point ROS otherwise negative.  ____________________________________________   PHYSICAL EXAM:  VITAL SIGNS: Temp: Pulse: 69 Resp: 17 BP: 161/82 SpO2: 97%  Constitutional: Alert and oriented. Well appearing and in no acute distress. Eyes: Conjunctivae are normal. PERRL.  Head: Atraumatic. Nose: No congestion/rhinnorhea. Mouth/Throat: Mucous membranes are moist.  Oropharynx non-erythematous. Neck: No stridor.   Cardiovascular: Normal rate, regular rhythm. Good peripheral circulation. Grossly normal heart sounds.   Respiratory: Normal respiratory effort.  No retractions. Lungs CTAB. Gastrointestinal: Soft and nontender. No distention.  Musculoskeletal: No lower extremity  tenderness nor edema. No gross deformities of extremities. Neurologic:  Normal speech and language. No gross focal neurologic deficits are appreciated.  Skin:  Skin is warm, dry and intact. No rash noted. Psychiatric: Mood and affect are normal. Speech and behavior are normal.  ____________________________________________   LABS (all labs ordered are listed, but only abnormal results are displayed)  Labs Reviewed  CBC - Abnormal; Notable for the following:    Platelets 418 (*)    All other components within normal limits  URINALYSIS, ROUTINE W REFLEX MICROSCOPIC (NOT AT [facility]) - Abnormal; Notable for the following:    APPearance CLOUDY (*)    Leukocytes, UA MODERATE (*)    All other components within normal limits  URINE MICROSCOPIC-ADD ON - Abnormal; Notable for the following:    Squamous Epithelial / LPF 6-30 (*)    Bacteria, UA FEW (*)    All other components within normal limits  TROPONIN I - Abnormal; Notable for the following:    Troponin I 0.03 (*)    All other components within normal limits  BASIC METABOLIC PANEL  PREGNANCY, URINE  D-DIMER, QUANTITATIVE (NOT AT [facility])  CBC  HEPARIN LEVEL (UNFRACTIONATED)  TROPONIN I  TROPONIN I  LIPID PANEL  BASIC METABOLIC PANEL  I-STAT TROPOININ, ED  CBG MONITORING, ED   ____________________________________________  EKG  Reviewed. Dynamic T wave inversions.  ____________________________________________  RADIOLOGY  Dg Chest 2 View  12/21/2015  CLINICAL DATA:  Left side chest pain starting 3 hours ago EXAM: CHEST  2 VIEW COMPARISON:  None. FINDINGS: Borderline cardiomegaly. No acute infiltrate or pleural effusion. No pulmonary edema. Mild degenerative changes mid and lower thoracic spine. IMPRESSION: No active cardiopulmonary disease. Electronically Signed   By: Natasha Mead M.D.   On: 12/21/2015 18:11   ____________________________________________   PROCEDURES  Procedure(s) performed:    Procedures  None ____________________________________________   INITIAL IMPRESSION / ASSESSMENT AND PLAN / ED COURSE  Pertinent labs & imaging results that were available during my care of the patient were reviewed by me and considered in my medical decision making (see chart for details).  Patient resents emergency department for evaluation of atypical chest pain. Patient with HEART score 3. Low risk Wells but pain is atypical. Plan for d-dimer with low prob PE and reassess. Low suspicion for aortic disease.   08:17 PM Patient with active chest pain. EKG done with new T wave inversions. Plan to discuss with cardiology.   08:33 PM I spoke with cardiology on call. He reviewed the EKG and recommended heparin overnight and possible cath in the AM. Placed heparin infusion.   Patient admitted to hospitalist regarding admission. They will place orders.  ____________________________________________  FINAL CLINICAL IMPRESSION(S) / ED DIAGNOSES  Final diagnoses:  Chest pain, unspecified chest pain type  Diabetes mellitus without complication (HCC)  Essential hypertension     MEDICATIONS GIVEN DURING THIS VISIT:  Medications  heparin ADULT infusion 100 units/mL (25000 units/2101mL sodium chloride 0.45%) (900 Units/hr Intravenous New Bag/Given 12/21/15 2101)  aspirin EC  tablet 81 mg (not administered)  nitroGLYCERIN (NITROSTAT) SL tablet 0.4 mg (not administered)  acetaminophen (TYLENOL) tablet 650 mg (not administered)  ondansetron (ZOFRAN) injection 4 mg (not administered)  atorvastatin (LIPITOR) tablet 10 mg (not administered)  lisinopril (PRINIVIL,ZESTRIL) tablet 40 mg (not administered)  cefTRIAXone (ROCEPHIN) 1 g in dextrose 5 % 50 mL IVPB (not administered)  insulin aspart (novoLOG) injection 0-9 Units (not administered)  aspirin chewable tablet 324 mg (324 mg Oral Given 12/21/15 2023)  heparin injection 4,000 Units (4,000 Units Intravenous Given 12/21/15 2058)     NEW  OUTPATIENT MEDICATIONS STARTED DURING THIS VISIT:  None   Note:  This document was prepared using Dragon voice recognition software and may include unintentional dictation errors.  Alona Bene, MD Emergency Medicine  Maia Plan, MD 12/22/15 [phone]

## 2015-12-21 NOTE — H&P (Signed)
History and Physical    Courtney HalsSeacy Branca NWG:[id number] DOB: 01-Sep-1970 DOA: 12/21/2015   PCP: Hetty BlendVickie Henson, NP Chief Complaint:  Chief Complaint  Patient presents with  . Hypoglycemia  . Chest Pain    HPI: Courtney Stewart is a 45 y.o. female with medical history significant of HTN, DM2.  Patient presents to the ED with 2 episodes of sudden, sharp, chest pain that occurred earlier today while at work.  Episodes lasted about 1 min before resolving.  No SOB, no pleuritic pain, no PMH of MI or blood clots.  Not associated with activity.  Located in left anterior chest.  Quality was sharp and piercing.  ED Course: Had 3rd episode that lasted about 1 min.  Despite atypical HPI, patient has dynamic EKG changes with T wave inversions and ST depressions on repeat EKG in ED that were not present on the arrival EKG just a couple hours earlier this afternoon.  Review of Systems: As per HPI otherwise 10 point review of systems negative.    Past Medical History  Diagnosis Date  . Diabetes mellitus without complication (HCC)   . Anemia   . Hypertension   . UC (ulcerative colitis) [facility](HCC)     Past Surgical History  Procedure Laterality Date  . Cesarean section       reports that she has never smoked. She does not have any smokeless tobacco history on file. She reports that she does not drink alcohol or use illicit drugs.  Allergies  Allergen Reactions  . Latex Shortness Of Breath and Swelling  . Peanut Oil Anaphylaxis  . Tomato Anaphylaxis  . Amlodipine     Possibly diarrhea  . Hydrochlorothiazide W-Triamterene     Dehydration  . Metoprolol     Side effect decreased energy  . Shellfish-Derived Products     Family History  Problem Relation Age of Onset  . Diabetes Mother   . Hyperlipidemia Mother   . Hypertension Mother   . Cancer Father       Prior to Admission medications   Medication Sig Start Date End Date Taking? Authorizing Provider  Charcoal Activated (CHARCOAL PO) Take  1 tablet by mouth 2 (two) times daily as needed (stomach pain.).   Yes Historical Provider, MD  lisinopril (PRINIVIL,ZESTRIL) 20 MG tablet Take 40 mg by mouth daily.    Yes Historical Provider, MD  metFORMIN (GLUCOPHAGE) 500 MG tablet TAKE 1 TABLET BY MOUTH TWICE DAILY WITH A MEAL Patient taking differently: TAKE 1 TABLET BY MOUTH ONCE DAILY. 09/13/15  Yes Vickie Jeneen RinksL Henson, NP  glucose blood test strip Test 4-5 times a week before eating and 3-4 times a week 2 hours after eating. Pt uses- one touch verio IQ meter 08/24/15   Avanell ShackletonVickie L Henson, NP  ONETOUCH DELICA LANCETS FINE MISC TEST 4 TO 5 TMES A WEEK BEFORE EATING AND 3 TO 4 TIMES A WEEK, 2 HOURS AFTER EATING 08/24/15   Avanell ShackletonVickie L Henson, NP    Physical Exam: Filed Vitals:   12/21/15 2000 12/21/15 2016 12/21/15 2100 12/21/15 2130  BP: 156/87 156/87 146/84 156/80  Pulse: 73 70 65 70  Resp: 21 19 15 15   SpO2: 99% 98% 97% 97%      Constitutional: NAD, calm, comfortable Eyes: PERRL, lids and conjunctivae normal ENMT: Mucous membranes are moist. Posterior pharynx clear of any exudate or lesions.Normal dentition.  Neck: normal, supple, no masses, no thyromegaly Respiratory: clear to auscultation bilaterally, no wheezing, no crackles. Normal respiratory effort. No accessory muscle use.  Cardiovascular: Regular rate and rhythm, no murmurs / rubs / gallops. No extremity edema. 2+ pedal pulses. No carotid bruits.  Abdomen: no tenderness, no masses palpated. No hepatosplenomegaly. Bowel sounds positive.  Musculoskeletal: no clubbing / cyanosis. No joint deformity upper and lower extremities. Good ROM, no contractures. Normal muscle tone.  Skin: no rashes, lesions, ulcers. No induration Neurologic: CN 2-12 grossly intact. Sensation intact, DTR normal. Strength 5/5 in all 4.  Psychiatric: Normal judgment and insight. Alert and oriented x 3. Normal mood.    Labs on Admission: I have personally reviewed following labs and imaging studies  CBC:  Recent  Labs Lab 12/17/15 1035 12/21/15 1827  WBC 7.1 7.0  NEUTROABS 3976  --   HGB 11.9 12.9  HCT 37.3 40.2  MCV 83.8 85.4  PLT 471* 418*   Basic Metabolic Panel:  Recent Labs Lab 12/17/15 1035 12/21/15 1919  NA 138 138  K 4.3 4.1  CL 104 106  CO2 22 25  GLUCOSE 91 89  BUN 14 11  CREATININE 1.00 0.88  CALCIUM 9.7 9.5   GFR: Estimated Creatinine Clearance: 91.5 mL/min (by C-G formula based on Cr of 0.88). Liver Function Tests:  Recent Labs Lab 12/17/15 1035  AST 14  ALT 17  ALKPHOS 58  BILITOT 0.3  PROT 7.6  ALBUMIN 4.2    Recent Labs Lab 12/17/15 1035  LIPASE 66*   No results for input(s): AMMONIA in the last 168 hours. Coagulation Profile: No results for input(s): INR, PROTIME in the last 168 hours. Cardiac Enzymes: No results for input(s): CKTOTAL, CKMB, CKMBINDEX, TROPONINI in the last 168 hours. BNP (last 3 results) No results for input(s): PROBNP in the last 8760 hours. HbA1C: No results for input(s): HGBA1C in the last 72 hours. CBG:  Recent Labs Lab 12/21/15 1928  GLUCAP 94   Lipid Profile: No results for input(s): CHOL, HDL, LDLCALC, TRIG, CHOLHDL, LDLDIRECT in the last 72 hours. Thyroid Function Tests: No results for input(s): TSH, T4TOTAL, FREET4, T3FREE, THYROIDAB in the last 72 hours. Anemia Panel: No results for input(s): VITAMINB12, FOLATE, FERRITIN, TIBC, IRON, RETICCTPCT in the last 72 hours. Urine analysis:    Component Value Date/Time   COLORURINE YELLOW 12/21/2015 1919   APPEARANCEUR CLOUDY* 12/21/2015 1919   LABSPEC 1.005 12/21/2015 1919   PHURINE 5.5 12/21/2015 1919   GLUCOSEU NEGATIVE 12/21/2015 1919   HGBUR NEGATIVE 12/21/2015 1919   BILIRUBINUR NEGATIVE 12/21/2015 1919   BILIRUBINUR n 08/12/2015 1118   KETONESUR NEGATIVE 12/21/2015 1919   PROTEINUR NEGATIVE 12/21/2015 1919   PROTEINUR n 08/12/2015 1118   UROBILINOGEN negative 08/12/2015 1118   NITRITE NEGATIVE 12/21/2015 1919   NITRITE n 08/12/2015 1118    LEUKOCYTESUR MODERATE* 12/21/2015 1919   Sepsis Labs: (procalcitonin:4,lacticidven:4) )No results found for this or any previous visit (from the past 240 hour(s)).   Radiological Exams on Admission: Dg Chest 2 View  12/21/2015  CLINICAL DATA:  Left side chest pain starting 3 hours ago EXAM: CHEST  2 VIEW COMPARISON:  None. FINDINGS: Borderline cardiomegaly. No acute infiltrate or pleural effusion. No pulmonary edema. Mild degenerative changes mid and lower thoracic spine. IMPRESSION: No active cardiopulmonary disease. Electronically Signed   By: Natasha Mead M.D.   On: 12/21/2015 18:11    EKG: Independently reviewed.  Assessment/Plan Principal Problem:   NSTEMI (non-ST elevated myocardial infarction) (HCC) Active Problems:   Diabetes mellitus without complication (HCC)   Essential hypertension   UTI (lower urinary tract infection)    Chest pain with dynamic  EKG changes -  Despite atypical HPI features, her dynamic EKG changes are concerning.  EDP spoke with Dr. Shirlee Latch who said that cards would consult in AM and patient likely needed heart cath.  Will get patient admitted on NSTEMI pathway to [facility]  Serial trops  Tele monitor  Starting low dose statin  Got ASA  On lisinopril already  NPO after midnight  Heparin gtt  Daily EKG ordered   DM2 -  Sensitive scale SSI q4h while NPO  UTI - rocephin ordered   DVT prophylaxis: Heparin gtt Code Status: Full Family Communication: Family at bedside Consults called: EDP spoke with cards who will see patient in AM Admission status: Admit to inpatient at [facility], [place]. DO [facility] Pager [phone] from 7PM-7AM  If 7AM-7PM, please contact the day physician for the patient [email or web address] Password [email or web address]  12/21/2015, 10:40 PM

## 2015-12-21 NOTE — ED Notes (Signed)
Patient transported to X-ray 

## 2015-12-21 NOTE — ED Notes (Signed)
Bed: WA04 Expected date:  Expected time:  Means of arrival:  Comments: EMS- hypoglycemic

## 2015-12-21 NOTE — ED Notes (Addendum)
Per EMS, pt from work c/o chest tightness with blood sugar 63. After 12.5g oral glucose, sugar was 123. Hx diabetes. Chest tightness increases with movement.

## 2015-12-21 NOTE — ED Notes (Signed)
RN will start a 2nd IV line and draw blood work 

## 2015-12-21 NOTE — Progress Notes (Signed)
ANTICOAGULATION CONSULT NOTE - Initial Consult  Pharmacy Consult for heparin Indication: chest pain/ACS  Allergies  Allergen Reactions  . Latex Shortness Of Breath and Swelling  . Peanut Oil Anaphylaxis  . Tomato Anaphylaxis  . Amlodipine     Possibly diarrhea  . Hydrochlorothiazide W-Triamterene     Dehydration  . Metoprolol     Side effect decreased energy  . Shellfish-Derived Products     Patient Measurements:   Heparin Dosing Weight: 74kg  Vital Signs: BP: 156/87 mmHg (07/11 2016) Pulse Rate: 70 (07/11 2016)  Labs:  Recent Labs  12/21/15 1827 12/21/15 1919  HGB 12.9  --   HCT 40.2  --   PLT 418*  --   CREATININE  --  0.88    Estimated Creatinine Clearance: 91.5 mL/min (by C-G formula based on Cr of 0.88).   Medical History: Past Medical History  Diagnosis Date  . Diabetes mellitus without complication (HCC)   . Anemia   . Hypertension   . UC (ulcerative colitis) (HCC)     Assessment: Courtney Stewart presents with hypoglycemia and chest tightness. Pharmacy asked to dose heparin gtt for ACS.  Possible cath 7/12am. Heparin bolus 4000 units x 1 already ordered with ASA 324mg   Baseline CBC with Hgb WNL and elevated pltc   Goal of Therapy:  Heparin level 0.3-0.7 units/ml Monitor platelets by anticoagulation protocol: Yes   Plan:   Heparin 4000 units x 1 as ordered then heparin 900 units/hr  Check 6h heparin level   Daily heparin level and CBC  Await plans for possible cath  Monitor for bleeding  Juliette Alcideustin Ibrohim Simmers, PharmD, BCPS.   Pager: [phone] 12/21/2015 8:39 PM

## 2015-12-21 NOTE — ED Notes (Signed)
Repeated EKG per EDP Long

## 2015-12-22 ENCOUNTER — Encounter (HOSPITAL_COMMUNITY): Payer: Self-pay | Admitting: Cardiology

## 2015-12-22 ENCOUNTER — Encounter (HOSPITAL_COMMUNITY)
Admission: EM | Disposition: A | Discharge status: Home / Self Care | Payer: Self-pay | Source: Home / Self Care | Attending: Internal Medicine

## 2015-12-22 DIAGNOSIS — E119 Type 2 diabetes mellitus without complications: Secondary | ICD-10-CM

## 2015-12-22 DIAGNOSIS — I214 Non-ST elevation (NSTEMI) myocardial infarction: Secondary | ICD-10-CM

## 2015-12-22 DIAGNOSIS — I10 Essential (primary) hypertension: Secondary | ICD-10-CM

## 2015-12-22 DIAGNOSIS — N390 Urinary tract infection, site not specified: Secondary | ICD-10-CM

## 2015-12-22 HISTORY — PX: CARDIAC CATHETERIZATION: SHX172

## 2015-12-22 LAB — LIPID PANEL
CHOL/HDL RATIO: 6.4 ratio
CHOLESTEROL: 218 mg/dL — AB (ref 0–200)
HDL: 34 mg/dL — ABNORMAL LOW (ref >40)
LDL CALC: 149 mg/dL — AB (ref 0–99)
Triglycerides: 173 mg/dL — ABNORMAL HIGH (ref <150)
VLDL: 35 mg/dL (ref 0–40)

## 2015-12-22 LAB — BASIC METABOLIC PANEL
Anion gap: 10 (ref 5–15)
BUN: 9 mg/dL (ref 6–20)
CALCIUM: 9.6 mg/dL (ref 8.9–10.3)
CO2: 23 mmol/L (ref 22–32)
CREATININE: 0.92 mg/dL (ref 0.44–1.00)
Chloride: 105 mmol/L (ref 101–111)
GFR calc Af Amer: >60 mL/min (ref >60)
GLUCOSE: 96 mg/dL (ref 65–99)
Potassium: 4 mmol/L (ref 3.5–5.1)
Sodium: 138 mmol/L (ref 135–145)

## 2015-12-22 LAB — HEMOGLOBIN A1C
HEMOGLOBIN A1C: 6.3 % — AB (ref 4.8–5.6)
MEAN PLASMA GLUCOSE: 134 mg/dL

## 2015-12-22 LAB — GLUCOSE, CAPILLARY
GLUCOSE-CAPILLARY: 105 mg/dL — AB (ref 65–99)
Glucose-Capillary: 92 mg/dL (ref 65–99)
Glucose-Capillary: 101 mg/dL — ABNORMAL HIGH (ref 65–99)
Glucose-Capillary: 135 mg/dL — ABNORMAL HIGH (ref 65–99)

## 2015-12-22 LAB — CBG MONITORING, ED
GLUCOSE-CAPILLARY: 97 mg/dL (ref 65–99)
GLUCOSE-CAPILLARY: 99 mg/dL (ref 65–99)

## 2015-12-22 LAB — HCG, QUANTITATIVE, PREGNANCY

## 2015-12-22 LAB — TSH: TSH: 3.21 u[IU]/mL (ref 0.350–4.500)

## 2015-12-22 LAB — MRSA PCR SCREENING: MRSA by PCR: NEGATIVE

## 2015-12-22 LAB — HEPARIN LEVEL (UNFRACTIONATED)
HEPARIN UNFRACTIONATED: 0.26 [IU]/mL — AB (ref 0.30–0.70)
HEPARIN UNFRACTIONATED: 0.33 [IU]/mL (ref 0.30–0.70)

## 2015-12-22 LAB — PROTIME-INR
INR: 1.07 (ref 0.00–1.49)
Prothrombin Time: 14.1 seconds (ref 11.6–15.2)

## 2015-12-22 LAB — TROPONIN I: TROPONIN I: 0.03 ng/mL — AB (ref <0.03)

## 2015-12-22 SURGERY — LEFT HEART CATH AND CORONARY ANGIOGRAPHY

## 2015-12-22 MED ORDER — IOPAMIDOL (ISOVUE-370) INJECTION 76%
INTRAVENOUS | Status: AC
  Filled 2015-12-22: qty 100

## 2015-12-22 MED ORDER — ONDANSETRON HCL 4 MG PO TABS
4.0000 mg | ORAL_TABLET | Freq: Once | ORAL | Status: AC
  Administered 2015-12-22: 4 mg via ORAL
  Filled 2015-12-22: qty 1

## 2015-12-22 MED ORDER — LIDOCAINE HCL (PF) 1 % IJ SOLN
INTRAMUSCULAR | Status: DC | PRN
  Administered 2015-12-22: 3 mL

## 2015-12-22 MED ORDER — HEPARIN SODIUM (PORCINE) 1000 UNIT/ML IJ SOLN
INTRAMUSCULAR | Status: DC | PRN
  Administered 2015-12-22: 7000 [IU] via INTRAVENOUS

## 2015-12-22 MED ORDER — LIDOCAINE HCL (PF) 1 % IJ SOLN
INTRAMUSCULAR | Status: AC
  Filled 2015-12-22: qty 30

## 2015-12-22 MED ORDER — HEPARIN SODIUM (PORCINE) 1000 UNIT/ML IJ SOLN
INTRAMUSCULAR | Status: AC
  Filled 2015-12-22: qty 1

## 2015-12-22 MED ORDER — HYDROMORPHONE HCL 1 MG/ML IJ SOLN
INTRAMUSCULAR | Status: DC | PRN
  Administered 2015-12-22: 0.5 mg via INTRAVENOUS

## 2015-12-22 MED ORDER — VERAPAMIL HCL 2.5 MG/ML IV SOLN
INTRA_ARTERIAL | Status: DC | PRN
  Administered 2015-12-22: 13:00:00 via INTRA_ARTERIAL

## 2015-12-22 MED ORDER — SODIUM CHLORIDE 0.9 % WEIGHT BASED INFUSION: 3.0000 mL/kg/h | INTRAVENOUS | Status: AC

## 2015-12-22 MED ORDER — HEPARIN (PORCINE) IN NACL 2-0.9 UNIT/ML-% IJ SOLN
INTRAMUSCULAR | Status: DC | PRN
  Administered 2015-12-22: 1500 mL

## 2015-12-22 MED ORDER — ATORVASTATIN CALCIUM 80 MG PO TABS
80.0000 mg | ORAL_TABLET | Freq: Once | ORAL | Status: AC
  Administered 2015-12-22: 80 mg via ORAL
  Filled 2015-12-22: qty 2

## 2015-12-22 MED ORDER — SODIUM CHLORIDE 0.9 % IV SOLN: 250.0000 mL | INTRAVENOUS | Status: DC | PRN

## 2015-12-22 MED ORDER — HEPARIN (PORCINE) IN NACL 2-0.9 UNIT/ML-% IJ SOLN
INTRAMUSCULAR | Status: AC
  Filled 2015-12-22: qty 1500

## 2015-12-22 MED ORDER — ASPIRIN EC 81 MG PO TBEC: 81.0000 mg | DELAYED_RELEASE_TABLET | Freq: Every day | ORAL | Status: DC

## 2015-12-22 MED ORDER — SODIUM CHLORIDE 0.9% FLUSH: 3.0000 mL | Freq: Two times a day (BID) | INTRAVENOUS | Status: DC

## 2015-12-22 MED ORDER — MIDAZOLAM HCL 2 MG/2ML IJ SOLN
INTRAMUSCULAR | Status: AC
  Filled 2015-12-22: qty 2

## 2015-12-22 MED ORDER — SODIUM CHLORIDE 0.9% FLUSH
3.0000 mL | Freq: Two times a day (BID) | INTRAVENOUS | Status: DC
  Administered 2015-12-22: 3 mL via INTRAVENOUS

## 2015-12-22 MED ORDER — ONDANSETRON HCL 4 MG/2ML IJ SOLN: 4.0000 mg | Freq: Four times a day (QID) | INTRAMUSCULAR | Status: DC | PRN

## 2015-12-22 MED ORDER — HYDROMORPHONE HCL 1 MG/ML IJ SOLN
INTRAMUSCULAR | Status: AC
  Filled 2015-12-22: qty 1

## 2015-12-22 MED ORDER — METFORMIN HCL 500 MG PO TABS: ORAL_TABLET | ORAL | Status: DC

## 2015-12-22 MED ORDER — MIDAZOLAM HCL 2 MG/2ML IJ SOLN
INTRAMUSCULAR | Status: DC | PRN
  Administered 2015-12-22: 2 mg via INTRAVENOUS
  Administered 2015-12-22: 1 mg via INTRAVENOUS

## 2015-12-22 MED ORDER — IOPAMIDOL (ISOVUE-370) INJECTION 76%
INTRAVENOUS | Status: DC | PRN
  Administered 2015-12-22: 80 mL via INTRAVENOUS

## 2015-12-22 MED ORDER — VERAPAMIL HCL 2.5 MG/ML IV SOLN
INTRAVENOUS | Status: AC
  Filled 2015-12-22: qty 2

## 2015-12-22 MED ORDER — SODIUM CHLORIDE 0.9% FLUSH: 3.0000 mL | INTRAVENOUS | Status: DC | PRN

## 2015-12-22 MED ORDER — NITROGLYCERIN 1 MG/10 ML FOR IR/CATH LAB
INTRA_ARTERIAL | Status: AC
  Filled 2015-12-22: qty 10

## 2015-12-22 MED ORDER — SODIUM CHLORIDE 0.9 % IV SOLN: INTRAVENOUS | Status: DC

## 2015-12-22 MED ORDER — ACETAMINOPHEN 325 MG PO TABS
650.0000 mg | ORAL_TABLET | ORAL | Status: DC | PRN
  Administered 2015-12-22: 650 mg via ORAL
  Filled 2015-12-22: qty 2

## 2015-12-22 MED ORDER — ATORVASTATIN CALCIUM 40 MG PO TABS: 40.0000 mg | ORAL_TABLET | Freq: Every day | ORAL | Status: DC

## 2015-12-22 SURGICAL SUPPLY — 10 items
CATH INFINITI 5FR ANG PIGTAIL (CATHETERS) ×2 IMPLANT
CATH OPTITORQUE TIG 4.0 5F (CATHETERS) ×2 IMPLANT
DEVICE RAD COMP TR BAND LRG (VASCULAR PRODUCTS) ×2 IMPLANT
GLIDESHEATH SLEND A-KIT 6F 20G (SHEATH) ×2 IMPLANT
KIT HEART LEFT (KITS) ×2 IMPLANT
PACK CARDIAC CATHETERIZATION (CUSTOM PROCEDURE TRAY) ×2 IMPLANT
SYR MEDRAD MARK V 150ML (SYRINGE) ×2 IMPLANT
TRANSDUCER W/STOPCOCK (MISCELLANEOUS) ×2 IMPLANT
TUBING CIL FLEX 10 FLL-RA (TUBING) ×2 IMPLANT
WIRE SAFE-T 1.5MM-J .035X260CM (WIRE) ×2 IMPLANT

## 2015-12-22 NOTE — Interval H&P Note (Signed)
History and Physical Interval Note:  12/22/2015 12:20 PM  Courtney Stewart  has presented today for surgery, with the diagnosis of unstable angina  The various methods of treatment have been discussed with the patient and family. After consideration of risks, benefits and other options for treatment, the patient has consented to  Procedure(s): Left Heart Cath and Coronary Angiography (N/A) and possible angioplasty as a surgical intervention .  The patient's history has been reviewed, patient examined, no change in status, stable for surgery.  I have reviewed the patient's chart and labs.  Questions were answered to the patient's satisfaction.    Cath Lab Visit (complete for each Cath Lab visit)  Clinical Evaluation Leading to the Procedure:   ACS: Yes.    Non-ACS:    Anginal Classification: CCS IV  Anti-ischemic medical therapy: No Therapy  Non-Invasive Test Results: No non-invasive testing performed  Prior CABG: No previous CABG       Yates DecampGANJI, Dennise Raabe

## 2015-12-22 NOTE — Progress Notes (Signed)
ANTICOAGULATION CONSULT NOTE - Follow-up Consult  Pharmacy Consult for heparin Indication: chest pain/ACS  Allergies  Allergen Reactions  . Latex Shortness Of Breath and Swelling  . Peanut Oil Anaphylaxis  . Tomato Anaphylaxis  . Amlodipine     Possibly diarrhea  . Hydrochlorothiazide W-Triamterene     Dehydration  . Metoprolol     Side effect decreased energy  . Shellfish-Derived Products     Patient Measurements: Height: 5\' 2"  (157.5 cm) Weight: 218 lb (98.884 kg) IBW/kg (Calculated) : 50.1 Heparin Dosing Weight: 74kg  Vital Signs: Temp: 97.6 F (36.4 C) (07/12 0436) Temp Source: Oral (07/12 0436) BP: 154/86 mmHg (07/12 0436) Pulse Rate: 68 (07/12 0200)  Labs:  Recent Labs  12/21/15 1827 12/21/15 1919 12/21/15 2310 12/22/15 0337  HGB 12.9  --   --   --   HCT 40.2  --   --   --   PLT 418*  --   --   --   HEPARINUNFRC  --   --   --  0.26*  CREATININE  --  0.88  --   --   TROPONINI  --   --  0.03*  --     Estimated Creatinine Clearance: 90.6 mL/min (by C-G formula based on Cr of 0.88).   Assessment: 44 YOF on heparin gtt for r/o ACS. Possible cath 7/12. Heparin level subtherapeutic on 900 units/hr. No issues with line or bleeding reported per RN.  Goal of Therapy:  Heparin level 0.3-0.7 units/ml Monitor platelets by anticoagulation protocol: Yes   Plan:   Increase heparin to 1050 units/hr  Check 6h heparin level   Christoper Fabianaron Keesha Pellum, PharmD, BCPS Clinical pharmacist, pager [phone] 12/22/2015 4:46 AM

## 2015-12-22 NOTE — Discharge Summary (Signed)
PATIENT DETAILS Name: Courtney Stewart Age: 45 y.o. Sex: female Date of Birth: 11/25/1970 MRN: [id number]. Admitting Physician: Hillary Bow, DO WUJ:[id number] Suezanne Jacquet, NP  Admit Date: 12/21/2015 Discharge date: 12/22/2015  Recommendations for Outpatient Follow-up:  1. New medication-aspirin, Lipitor 2. Will require continued outpatient immunization of diabetes and antihypertensives control of her risk factors  3. Please repeat CBC/BMET at next visit   PRIMARY DISCHARGE DIAGNOSIS:  Principal Problem:   NSTEMI (non-ST elevated myocardial infarction) (HCC) Active Problems:   Diabetes mellitus without complication (HCC)   Essential hypertension   UTI (lower urinary tract infection)      PAST MEDICAL HISTORY: Past Medical History  Diagnosis Date  . Diabetes mellitus without complication (HCC)   . Anemia   . Hypertension   . UC (ulcerative colitis) (HCC)     DISCHARGE MEDICATIONS: Current Discharge Medication List    START taking these medications   Details  aspirin EC 81 MG tablet Take 1 tablet (81 mg total) by mouth daily.    atorvastatin (LIPITOR) 40 MG tablet Take 1 tablet (40 mg total) by mouth daily. Qty: 30 tablet, Refills: 0      CONTINUE these medications which have CHANGED   Details  metFORMIN (GLUCOPHAGE) 500 MG tablet TAKE 1 TABLET BY MOUTH TWICE DAILY WITH A MEAL      CONTINUE these medications which have NOT CHANGED   Details  Charcoal Activated (CHARCOAL PO) Take 1 tablet by mouth 2 (two) times daily as needed (stomach pain.).    lisinopril (PRINIVIL,ZESTRIL) 20 MG tablet Take 40 mg by mouth daily.     glucose blood test strip Test 4-5 times a week before eating and 3-4 times a week 2 hours after eating. Pt uses- one touch verio IQ meter Qty: 100 each, Refills: 2    ONETOUCH DELICA LANCETS FINE MISC TEST 4 TO 5 TMES A WEEK BEFORE EATING AND 3 TO 4 TIMES A WEEK, 2 HOURS AFTER EATING Qty: 600 each, Refills: 1        ALLERGIES:   Allergies    Allergen Reactions  . Latex Shortness Of Breath and Swelling  . Peanut Oil Anaphylaxis  . Tomato Anaphylaxis  . Amlodipine     Possibly diarrhea  . Hydrochlorothiazide W-Triamterene     Dehydration  . Metoprolol     Side effect decreased energy  . Shellfish-Derived Products     BRIEF HPI:  See H&P, Labs, Consult and Test reports for all details in brief, patient was admitted for Evaluation of chest pain and new EKG changes.  CONSULTATIONS:   cardiology  PERTINENT RADIOLOGIC STUDIES: Dg Chest 2 View  12/21/2015  CLINICAL DATA:  Left side chest pain starting 3 hours ago EXAM: CHEST  2 VIEW COMPARISON:  None. FINDINGS: Borderline cardiomegaly. No acute infiltrate or pleural effusion. No pulmonary edema. Mild degenerative changes mid and lower thoracic spine. IMPRESSION: No active cardiopulmonary disease. Electronically Signed   By: Natasha Mead M.D.   On: 12/21/2015 18:11     PERTINENT LAB RESULTS: CBC:  Recent Labs  12/21/15 1827  WBC 7.0  HGB 12.9  HCT 40.2  PLT 418*   CMET CMP     Component Value Date/Time   NA 138 12/22/2015 0744   K 4.0 12/22/2015 0744   CL 105 12/22/2015 0744   CO2 23 12/22/2015 0744   GLUCOSE 96 12/22/2015 0744   BUN 9 12/22/2015 0744   CREATININE 0.92 12/22/2015 0744   CREATININE 1.00 12/17/2015 1035   CALCIUM  9.6 12/22/2015 0744   PROT 7.6 12/17/2015 1035   ALBUMIN 4.2 12/17/2015 1035   AST 14 12/17/2015 1035   ALT 17 12/17/2015 1035   ALKPHOS 58 12/17/2015 1035   BILITOT 0.3 12/17/2015 1035   GFRNONAA >60 12/22/2015 0744   GFRAA >60 12/22/2015 0744    GFR Estimated Creatinine Clearance: 86.6 mL/min (by C-G formula based on Cr of 0.92). No results for input(s): LIPASE, AMYLASE in the last 72 hours.  Recent Labs  12/21/15 2310 12/22/15 0744  TROPONINI 0.03* 0.03*   Invalid input(s): POCBNP  Recent Labs  12/21/15 1919  DDIMER 0.39   No results for input(s): HGBA1C in the last 72 hours.  Recent Labs  12/22/15 0913   CHOL 218*  HDL 34*  LDLCALC 149*  TRIG 173*  CHOLHDL 6.4    Recent Labs  12/22/15 0922  TSH 3.210   No results for input(s): VITAMINB12, FOLATE, FERRITIN, TIBC, IRON, RETICCTPCT in the last 72 hours. Coags:  Recent Labs  12/22/15 0806  INR 1.07   Microbiology: Recent Results (from the past 240 hour(s))  MRSA PCR Screening     Status: None   Collection Time: 12/22/15  2:24 AM  Result Value Ref Range Status   MRSA by PCR NEGATIVE NEGATIVE Final    Comment:        The GeneXpert MRSA Assay (FDA approved for NASAL specimens only), is one component of a comprehensive MRSA colonization surveillance program. It is not intended to diagnose MRSA infection nor to guide or monitor treatment for MRSA infections.      BRIEF HOSPITAL COURSE:   Principal Problem: Chest pain: Presented with mostly atypical chest pain, EKG with new T-wave inversions in most the lateral leads. Seen by cardiology, underwent cardiac catheterization that did not show any CAD. Her d-dimer was also negative. No further recommendations from cardiology at this point, we will continue aspirin and statin on discharge. Follow-up with PCP for further continued care. She is chest on discharge.  Active Problems: Diabetes mellitus without complication: Resume metformin on 7/14   Essential hypertension: Resume lisinopril  Asymptomatic bacteriuria: Presented with chest pain-no symptoms of UTI-does not need any antibiotics on discharge  TODAY-DAY OF DISCHARGE:  Subjective:   Almas Eskelson today has no headache,no chest abdominal pain,no new weakness tingling or numbness, feels much better wants to go home today.   Objective:   Blood pressure 130/68, pulse 65, temperature 97.1 F (36.2 C), temperature source Oral, resp. rate 11, height  (1.575 m), weight 98.884 kg (218 lb), last menstrual period 12/06/2015, SpO2 100 %.  Intake/Output Summary (Last 24 hours) at 12/22/15 1639 Last data filed at  12/22/15 1421  Gross per 24 hour  Intake 134.93 ml  Output    300 ml  Net -165.07 ml   Filed Weights   12/22/15 0200  Weight: 98.884 kg (218 lb)    Exam Awake Alert, Oriented *3, No new F.N deficits, Normal affect Sharon.AT,PERRAL Supple Neck,No JVD, No cervical lymphadenopathy appriciated.  Symmetrical Chest wall movement, Good air movement bilaterally, CTAB RRR,No Gallops,Rubs or new Murmurs, No Parasternal Heave +ve B.Sounds, Abd Soft, Non tender, No organomegaly appriciated, No rebound -guarding or rigidity. No Cyanosis, Clubbing or edema, No new Rash or bruise  DISCHARGE CONDITION: Stable  DISPOSITION: Home  DISCHARGE INSTRUCTIONS:    Activity:  As tolerated  Get Medicines reviewed and adjusted: Please take all your medications with you for your next visit with your Primary MD  Please request your Primary  MD to go over all hospital tests and procedure/radiological results at the follow up, please ask your Primary MD to get all Hospital records sent to his/her office.  If you experience worsening of your admission symptoms, develop shortness of breath, life threatening emergency, suicidal or homicidal thoughts you must seek medical attention immediately by calling 911 or calling your MD immediately  if symptoms less severe.  You must read complete instructions/literature along with all the possible adverse reactions/side effects for all the Medicines you take and that have been prescribed to you. Take any new Medicines after you have completely understood and accpet all the possible adverse reactions/side effects.   Do not drive when taking Pain medications.   Do not take more than prescribed Pain, Sleep and Anxiety Medications  Special Instructions: If you have smoked or chewed Tobacco  in the last 2 yrs please stop smoking, stop any regular Alcohol  and or any Recreational drug use.  Wear Seat belts while driving.  Please note  You were cared for by a hospitalist  during your hospital stay. Once you are discharged, your primary care physician will handle any further medical issues. Please note that NO REFILLS for any discharge medications will be authorized once you are discharged, as it is imperative that you return to your primary care physician (or establish a relationship with a primary care physician if you do not have one) for your aftercare needs so that they can reassess your need for medications and monitor your lab values.   Diet recommendation: Diabetic Diet Heart Healthy diet  Discharge Instructions    Call MD for:  difficulty breathing, headache or visual disturbances    Complete by:  As directed      Diet - low sodium heart healthy    Complete by:  As directed      Increase activity slowly    Complete by:  As directed            Follow-up Information    Follow up with Hetty BlendVickie Henson, NP. Schedule an appointment as soon as possible for a visit in 1 week.   Specialty:  Family Medicine   Why:  Hospital follow up   Contact information:   [address] [place] [address] [phone]       Total Time spent on discharge equals 25 minutes.  SignedJeoffrey Massed: Elizabet Schweppe 12/22/2015 4:39 PM

## 2015-12-22 NOTE — Progress Notes (Signed)
Patient resting comfortably, procedure at approximately 1200 today. She has been NPO since last night. Per physician request the IV in the right hand is being discontinued and IV team is here to place IV on left with ultrasound. Patient has requested it be ultrasound placed due to being a very difficult stick. Consent is signed and prep has been completed. All requested labs are complete as well as EKG. Patient is ready when Called for. Will stop heparin infusion per instructions from Dr Voncille LoGonji.

## 2015-12-22 NOTE — H&P (View-Only) (Signed)
CARDIOLOGY CONSULT NOTE  Patient ID: Courtney Stewart MRN: [id number] DOB/AGE: 07/03/1970 45 y.o.  Admit date: 12/21/2015 Referring Physician: Internal Medicine Primary Physician:  Vickie Henson, NP Reason for Consultation: [place]  HPI: Courtney Stewart  is a 45 y.o. female with a history of hypertension and diabetes mellitus. 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 Symptoms continued to worsen as she continued to work so she went to [facility] ED. Describes pain as a squeezing and tightening sensation in his mid to left side of her chest radiated to her back. She denies associated SOB, nausea, dizziness, or syncope. She reports a recent history of abd pain and difficulty sleeping over the past 1-2 weeks. Denies PND, orthopnea, edema, or symptoms suggestive of TIA or claudication. No reported history of hyperlipidemia. Remote history of light tobacco use. No significant family history of CAD.   Past Medical History  Diagnosis Date  . Diabetes mellitus without complication (HCC)   . Anemia   . Hypertension   . UC (ulcerative colitis) (HCC)      Past Surgical History  Procedure Laterality Date  . Cesarean section       Family History  Problem Relation Age of Onset  . Diabetes Mother   . Hyperlipidemia Mother   . Hypertension Mother   . Cancer Father      Social History: Social History   Social History  . Marital Status: Single    Spouse Name: N/A  . Number of Children: N/A  . Years of Education: N/A   Occupational History  . Not on file.   Social History Main Topics  . Smoking status: Never Smoker   . Smokeless tobacco: Not on file  . Alcohol Use: No  . Drug Use: No  . Sexual Activity: Not on file   Other Topics Concern  . Not on file   Social History Narrative     Prescriptions prior to admission  Medication Sig Dispense Refill Last Dose  . Charcoal Activated (CHARCOAL PO) Take 1 tablet by mouth 2 (two) times daily as needed (stomach pain.).    12/20/2015 at Unknown time  . lisinopril (PRINIVIL,ZESTRIL) 20 MG tablet Take 40 mg by mouth daily.    12/21/2015 at Unknown time  . metFORMIN (GLUCOPHAGE) 500 MG tablet TAKE 1 TABLET BY MOUTH TWICE DAILY WITH A MEAL (Patient taking differently: TAKE 1 TABLET BY MOUTH ONCE DAILY.) 180 tablet 1 12/21/2015 at Unknown time  . glucose blood test strip Test 4-5 times a week before eating and 3-4 times a week 2 hours after eating. Pt uses- one touch verio IQ meter 100 each 2 Taking  . ONETOUCH DELICA LANCETS FINE MISC TEST 4 TO 5 TMES A WEEK BEFORE EATING AND 3 TO 4 TIMES A WEEK, 2 HOURS AFTER EATING 600 each 1 Taking     ROS: General: no fevers/chills/night sweats Eyes: no blurry vision, diplopia, or amaurosis ENT: no sore throat or hearing loss Resp: no SOB, cough, wheezing, or hemoptysis CV: Reports chest pain; no edema or palpitations GI: Reports recent episodes of abdominal pain; no nausea, vomiting, diarrhea, or constipation GU: no dysuria, frequency, or hematuria Skin: no rash Neuro: no headache, numbness, tingling, or weakness of extremities Musculoskeletal: no joint pain or swelling Heme: no bleeding, DVT, or easy bruising Endo: no polydipsia or polyuria    Physical Exam: Blood pressure 151/86, pulse 74, temperature 97.6 F (36.4 C), temperature source Oral, resp. rate 20, height 5' 2" (1.575 m), weight   98.884 kg (218 lb), last menstrual period 12/06/2015, SpO2 100 %.   General appearance: alert, cooperative, appears stated age, no distress and moderately obese Neck: no adenopathy, no carotid bruit, no JVD, supple, symmetrical, trachea midline and thyroid not enlarged, symmetric, no tenderness/mass/nodules Lungs: clear to auscultation bilaterally Heart: regular rate and rhythm, S1, S2 normal, no murmur, click, rub or gallop Extremities: extremities normal, atraumatic, no cyanosis or edema Pulses: 2+ and symmetric Skin: Skin color, texture, turgor normal. No rashes or  lesions Neurologic: Grossly normal  Labs:   Lab Results  Component Value Date   WBC 7.0 12/21/2015   HGB 12.9 12/21/2015   HCT 40.2 12/21/2015   MCV 85.4 12/21/2015   PLT 418* 12/21/2015    Recent Labs Lab 12/17/15 1035 12/21/15 1919  NA 138 138  K 4.3 4.1  CL 104 106  CO2 22 25  BUN 14 11  CREATININE 1.00 0.88  CALCIUM 9.7 9.5  PROT 7.6  --   BILITOT 0.3  --   ALKPHOS 58  --   ALT 17  --   AST 14  --   GLUCOSE 91 89    Lipid Panel  No results found for: CHOL, TRIG, HDL, CHOLHDL, VLDL, LDLCALC  BNP (last 3 results) No results for input(s): BNP in the last 8760 hours.  HEMOGLOBIN A1C Lab Results  Component Value Date   HGBA1C 6.3 12/17/2015    Cardiac Panel (last 3 results)  Recent Labs  12/21/15 2310  TROPONINI 0.03*    Lab Results  Component Value Date   TROPONINI 0.03* 12/21/2015     TSH  Recent Labs  08/12/15 0001 12/17/15 1035  TSH 1.90 2.69    EKG 7/11/2017at 2012: sinus rhythm at a rate of 74 bpm, normal axis, anterolateral T wave inversion, cannot exclude ischemia. Compared to EKG on 12/21/2015 at 1758, T wave inversion new.   Radiology: Dg Chest 2 View  12/21/2015  CLINICAL DATA:  Left side chest pain starting 3 hours ago EXAM: CHEST  2 VIEW COMPARISON:  None. FINDINGS: Borderline cardiomegaly. No acute infiltrate or pleural effusion. No pulmonary edema. Mild degenerative changes mid and lower thoracic spine. IMPRESSION: No active cardiopulmonary disease. Electronically Signed   By: Natasha MeadLiviu  Pop M.D.   On: 12/21/2015 18:11    Scheduled Meds: . aspirin EC  81 mg Oral Daily  . atorvastatin  10 mg Oral q1800  . cefTRIAXone (ROCEPHIN)  IV  1 g Intravenous Q24H  . insulin aspart  0-9 Units Subcutaneous Q4H  . lisinopril  40 mg Oral Daily  . sodium chloride flush  3 mL Intravenous Q12H   Continuous Infusions: . [START ON 12/23/2015] sodium chloride    . heparin 1,050 Units/hr (12/22/15 0457)   PRN Meds:.sodium chloride, acetaminophen,  nitroGLYCERIN, ondansetron (ZOFRAN) IV, sodium chloride flush  ASSESSMENT AND PLAN:  1. Unstable Angina 2. Abnormal EKG 3. Essential hypertension 4. DM II  Recommendation: She presents with chest pain suggestive of unstable angina with anterolateral T wave inversion that developed after arrival to ED consistent with ischemia. Recommend coronary angiogram for definitive evaluation of coronary anatomy. Dr. Jacinto HalimGanji presently at bedside to discuss.    Erling Contellison,Kairos Panetta Nicole, NP-C 12/22/2015, 8:37 AM [facility] Pager: [phone] Office: [phone]

## 2015-12-22 NOTE — Consult Note (Signed)
CARDIOLOGY CONSULT NOTE  Patient ID: Courtney Stewart MRN: [id number] DOB/AGE: 44-Oct-1972 44 y.o.  Admit date: 12/21/2015 Referring Physician: Internal Medicine Primary Physician:  Hetty Blend, NP Reason for Consultation: [place]  HPI: Courtney Stewart  is a 45 y.o. female with a history of hypertension and diabetes mellitus. 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 Symptoms continued to worsen as she continued to work so she went to [facility] ED. Describes pain as a squeezing and tightening sensation in his mid to left side of her chest radiated to her back. She denies associated SOB, nausea, dizziness, or syncope. She reports a recent history of abd pain and difficulty sleeping over the past 1-2 weeks. Denies PND, orthopnea, edema, or symptoms suggestive of TIA or claudication. No reported history of hyperlipidemia. Remote history of light tobacco use. No significant family history of CAD.   Past Medical History  Diagnosis Date  . Diabetes mellitus without complication (HCC)   . Anemia   . Hypertension   . UC (ulcerative colitis) [facility])      Past Surgical History  Procedure Laterality Date  . Cesarean section       Family History  Problem Relation Age of Onset  . Diabetes Mother   . Hyperlipidemia Mother   . Hypertension Mother   . Cancer Father      Social History: Social History   Social History  . Marital Status: Single    Spouse Name: N/A  . Number of Children: N/A  . Years of Education: N/A   Occupational History  . Not on file.   Social History Main Topics  . Smoking status: Never Smoker   . Smokeless tobacco: Not on file  . Alcohol Use: No  . Drug Use: No  . Sexual Activity: Not on file   Other Topics Concern  . Not on file   Social History Narrative     Prescriptions prior to admission  Medication Sig Dispense Refill Last Dose  . Charcoal Activated (CHARCOAL PO) Take 1 tablet by mouth 2 (two) times daily as needed (stomach pain.).    12/20/2015 at Unknown time  . lisinopril (PRINIVIL,ZESTRIL) 20 MG tablet Take 40 mg by mouth daily.    12/21/2015 at Unknown time  . metFORMIN (GLUCOPHAGE) 500 MG tablet TAKE 1 TABLET BY MOUTH TWICE DAILY WITH A MEAL (Patient taking differently: TAKE 1 TABLET BY MOUTH ONCE DAILY.) 180 tablet 1 12/21/2015 at Unknown time  . glucose blood test strip Test 4-5 times a week before eating and 3-4 times a week 2 hours after eating. Pt uses- one touch verio IQ meter 100 each 2 Taking  . ONETOUCH DELICA LANCETS FINE MISC TEST 4 TO 5 TMES A WEEK BEFORE EATING AND 3 TO 4 TIMES A WEEK, 2 HOURS AFTER EATING 600 each 1 Taking     ROS: General: no fevers/chills/night sweats Eyes: no blurry vision, diplopia, or amaurosis ENT: no sore throat or hearing loss Resp: no SOB, cough, wheezing, or hemoptysis CV: Reports chest pain; no edema or palpitations GI: Reports recent episodes of abdominal pain; no nausea, vomiting, diarrhea, or constipation GU: no dysuria, frequency, or hematuria Skin: no rash Neuro: no headache, numbness, tingling, or weakness of extremities Musculoskeletal: no joint pain or swelling Heme: no bleeding, DVT, or easy bruising Endo: no polydipsia or polyuria    Physical Exam: Blood pressure 151/86, pulse 74, temperature 97.6 F (36.4 C), temperature source Oral, resp. rate 20, height  (1.575 m), weight  98.884 kg (218 lb), last menstrual period 12/06/2015, SpO2 100 %.   General appearance: alert, cooperative, appears stated age, no distress and moderately obese Neck: no adenopathy, no carotid bruit, no JVD, supple, symmetrical, trachea midline and thyroid not enlarged, symmetric, no tenderness/mass/nodules Lungs: clear to auscultation bilaterally Heart: regular rate and rhythm, S1, S2 normal, no murmur, click, rub or gallop Extremities: extremities normal, atraumatic, no cyanosis or edema Pulses: 2+ and symmetric Skin: Skin color, texture, turgor normal. No rashes or  lesions Neurologic: Grossly normal  Labs:   Lab Results  Component Value Date   WBC 7.0 12/21/2015   HGB 12.9 12/21/2015   HCT 40.2 12/21/2015   MCV 85.4 12/21/2015   PLT 418* 12/21/2015    Recent Labs Lab 12/17/15 1035 12/21/15 1919  NA 138 138  K 4.3 4.1  CL 104 106  CO2 22 25  BUN 14 11  CREATININE 1.00 0.88  CALCIUM 9.7 9.5  PROT 7.6  --   BILITOT 0.3  --   ALKPHOS 58  --   ALT 17  --   AST 14  --   GLUCOSE 91 89    Lipid Panel  No results found for: CHOL, TRIG, HDL, CHOLHDL, VLDL, LDLCALC  BNP (last 3 results) No results for input(s): BNP in the last 8760 hours.  HEMOGLOBIN A1C Lab Results  Component Value Date   HGBA1C 6.3 12/17/2015    Cardiac Panel (last 3 results)  Recent Labs  12/21/15 2310  TROPONINI 0.03*    Lab Results  Component Value Date   TROPONINI 0.03* 12/21/2015     TSH  Recent Labs  08/12/15 0001 12/17/15 1035  TSH 1.90 2.69    EKG 7/11/2017at 2012: sinus rhythm at a rate of 74 bpm, normal axis, anterolateral T wave inversion, cannot exclude ischemia. Compared to EKG on 12/21/2015 at 1758, T wave inversion new.   Radiology: Dg Chest 2 View  12/21/2015  CLINICAL DATA:  Left side chest pain starting 3 hours ago EXAM: CHEST  2 VIEW COMPARISON:  None. FINDINGS: Borderline cardiomegaly. No acute infiltrate or pleural effusion. No pulmonary edema. Mild degenerative changes mid and lower thoracic spine. IMPRESSION: No active cardiopulmonary disease. Electronically Signed   By: Natasha MeadLiviu  Pop M.D.   On: 12/21/2015 18:11    Scheduled Meds: . aspirin EC  81 mg Oral Daily  . atorvastatin  10 mg Oral q1800  . cefTRIAXone (ROCEPHIN)  IV  1 g Intravenous Q24H  . insulin aspart  0-9 Units Subcutaneous Q4H  . lisinopril  40 mg Oral Daily  . sodium chloride flush  3 mL Intravenous Q12H   Continuous Infusions: . [START ON 12/23/2015] sodium chloride    . heparin 1,050 Units/hr (12/22/15 0457)   PRN Meds:.sodium chloride, acetaminophen,  nitroGLYCERIN, ondansetron (ZOFRAN) IV, sodium chloride flush  ASSESSMENT AND PLAN:  1. Unstable Angina 2. Abnormal EKG 3. Essential hypertension 4. DM II  Recommendation: She presents with chest pain suggestive of unstable angina with anterolateral T wave inversion that developed after arrival to ED consistent with ischemia. Recommend coronary angiogram for definitive evaluation of coronary anatomy. Dr. Jacinto HalimGanji presently at bedside to discuss.    Erling Contellison,Annalaura Sauseda Nicole, NP-C 12/22/2015, 8:37 AM [facility] Pager: [phone] Office: [phone]

## 2015-12-22 NOTE — Progress Notes (Signed)
Patient doing well post procedure. Deflation of cuff in progress with no active bleeding. She has been up to the bedside commode and is currently sleeping in the recliner. Asked patient if she would like to go back to bed and she stated she would rather stay in the chair. Tolerating diet with no nausea or vomitting. Will continue to monitor.

## 2015-12-22 NOTE — Progress Notes (Signed)
Patient continues to progress, band has been removed and tegaderm/gauze has been applied. Instructions have been given to not remove for 24 hours as well as elevating the extremity for the next 24 hours. Up to the bathroom without assist. No pain reported. Diet has been advanced to heart healthy as transcribed. No questions or concerns at this time.

## 2015-12-23 ENCOUNTER — Encounter: Payer: Self-pay | Admitting: Family Medicine

## 2015-12-23 ENCOUNTER — Ambulatory Visit (INDEPENDENT_AMBULATORY_CARE_PROVIDER_SITE_OTHER): Payer: BLUE CROSS/BLUE SHIELD | Admitting: Family Medicine

## 2015-12-23 VITALS — BP 144/100 | HR 72 | Wt 222.0 lb

## 2015-12-23 DIAGNOSIS — E119 Type 2 diabetes mellitus without complications: Secondary | ICD-10-CM | POA: Diagnosis not present

## 2015-12-23 DIAGNOSIS — I214 Non-ST elevation (NSTEMI) myocardial infarction: Secondary | ICD-10-CM | POA: Diagnosis not present

## 2015-12-23 DIAGNOSIS — I10 Essential (primary) hypertension: Secondary | ICD-10-CM | POA: Diagnosis not present

## 2015-12-23 LAB — CBC WITH DIFFERENTIAL/PLATELET
BASOS ABS: 64 {cells}/uL (ref 0–200)
Basophils Relative: 1 %
EOS PCT: 4 %
Eosinophils Absolute: 256 cells/uL (ref 15–500)
HCT: 37.2 % (ref 35.0–45.0)
Hemoglobin: 11.9 g/dL (ref 11.7–15.5)
LYMPHS PCT: 30 %
Lymphs Abs: 1920 cells/uL (ref 850–3900)
MCH: 26.9 pg — AB (ref 27.0–33.0)
MCHC: 32 g/dL (ref 32.0–36.0)
MCV: 84 fL (ref 80.0–100.0)
MONOS PCT: 12 %
MPV: 11.1 fL (ref 7.5–12.5)
Monocytes Absolute: 768 cells/uL (ref 200–950)
NEUTROS ABS: 3392 {cells}/uL (ref 1500–7800)
NEUTROS PCT: 53 %
PLATELETS: 463 10*3/uL — AB (ref 140–400)
RBC: 4.43 MIL/uL (ref 3.80–5.10)
RDW: 15.3 % — AB (ref 11.0–15.0)
WBC: 6.4 10*3/uL (ref 4.0–10.5)

## 2015-12-23 MED ORDER — AZILSARTAN-CHLORTHALIDONE 40-25 MG PO TABS: 1.0000 | ORAL_TABLET | Freq: Every day | ORAL | Status: DC

## 2015-12-23 MED ORDER — AZILSARTAN-CHLORTHALIDONE 40-12.5 MG PO TABS: 1.0000 | ORAL_TABLET | Freq: Every day | ORAL | Status: DC

## 2015-12-23 NOTE — Patient Instructions (Addendum)
Bring your blood pressure cuff in for a nurse visit to compare it against ours. Stop the Lisinopril. Start taking Edarbyclor 40/12.5 for the first 2 weeks. Call and let us know what your blood pressure readings are and if you are still seeing numbers >140/80 then you can bump up to the samples of Edarbyclor 40/25mg .   Let me know if you have any issues with this.  Start taking cholesterol medication. Continue on daily Aspirin.  Miralax for constipation.  No more enemas.  Keep a journal of your blood sugars and blood pressures and bring this to your next appointment. I would like to see you in 2 weeks and we will do labs at that time.   I am referring you back to the medical nutritionist and you need to call them to schedule the appointment [phone].  Start getting at least 150 minutes of physical activity per week.     Low-Sodium Eating Plan Sodium raises blood pressure and causes water to be held in the body. Getting less sodium from food will help lower your blood pressure, reduce any swelling, and protect your heart, liver, and kidneys. We get sodium by adding salt (sodium chloride) to food. Most of our sodium comes from canned, boxed, and frozen foods. Restaurant foods, fast foods, and pizza are also very high in sodium. Even if you take medicine to lower your blood pressure or to reduce fluid in your body, getting less sodium from your food is important. WHAT IS MY PLAN? Most people should limit their sodium intake to 2,300 mg a day. Your health care provider recommends that you limit your sodium intake to __2,000________ a day.  WHAT DO I NEED TO KNOW ABOUT THIS EATING PLAN? For the low-sodium eating plan, you will follow these general guidelines:  Choose foods with a % Daily Value for sodium of less than 5% (as listed on the food label).   Use salt-free seasonings or herbs instead of table salt or sea salt.   Check with your health care provider or pharmacist before using salt  substitutes.   Eat fresh foods.  Eat more vegetables and fruits.  Limit canned vegetables. If you do use them, rinse them well to decrease the sodium.   Limit cheese to 1 oz (28 g) per day.   Eat lower-sodium products, often labeled as "lower sodium" or "no salt added."  Avoid foods that contain monosodium glutamate (MSG). MSG is sometimes added to [place] food and some canned foods.  Check food labels (Nutrition Facts labels) on foods to learn how much sodium is in one serving.  Eat more home-cooked food and less restaurant, buffet, and fast food.  When eating at a restaurant, ask that your food be prepared with less salt, or no salt if possible.  HOW DO I READ FOOD LABELS FOR SODIUM INFORMATION? The Nutrition Facts label lists the amount of sodium in one serving of the food. If you eat more than one serving, you must multiply the listed amount of sodium by the number of servings. Food labels may also identify foods as:  Sodium free--Less than 5 mg in a serving.  Very low sodium--35 mg or less in a serving.  Low sodium--140 mg or less in a serving.  Light in sodium--50% less sodium in a serving. For example, if a food that usually has 300 mg of sodium is changed to become light in sodium, it will have 150 mg of sodium.  Reduced sodium--25% less sodium in a serving.  For example, if a food that usually has 400 mg of sodium is changed to reduced sodium, it will have 300 mg of sodium. WHAT FOODS CAN I EAT? Grains Low-sodium cereals, including oats, puffed wheat and rice, and shredded wheat cereals. Low-sodium crackers. Unsalted rice and pasta. Lower-sodium bread.  Vegetables Frozen or fresh vegetables. Low-sodium or reduced-sodium canned vegetables. Low-sodium or reduced-sodium tomato sauce and paste. Low-sodium or reduced-sodium tomato and vegetable juices.  Fruits Fresh, frozen, and canned fruit. Fruit juice.  Meat and Other Protein Products Low-sodium canned tuna  and salmon. Fresh or frozen meat, poultry, seafood, and fish. Lamb. Unsalted nuts. Dried beans, peas, and lentils without added salt. Unsalted canned beans. Homemade soups without salt. Eggs.  Dairy Milk. Soy milk. Ricotta cheese. Low-sodium or reduced-sodium cheeses. Yogurt.  Condiments Fresh and dried herbs and spices. Salt-free seasonings. Onion and garlic powders. Low-sodium varieties of mustard and ketchup. Fresh or refrigerated horseradish. Lemon juice.  Fats and Oils Reduced-sodium salad dressings. Unsalted butter.  Other Unsalted popcorn and pretzels.  The items listed above may not be a complete list of recommended foods or beverages. Contact your dietitian for more options. WHAT FOODS ARE NOT RECOMMENDED? Grains Instant hot cereals. Bread stuffing, pancake, and biscuit mixes. Croutons. Seasoned rice or pasta mixes. Noodle soup cups. Boxed or frozen macaroni and cheese. Self-rising flour. Regular salted crackers. Vegetables Regular canned vegetables. Regular canned tomato sauce and paste. Regular tomato and vegetable juices. Frozen vegetables in sauces. Salted [place] fries. Olives. Rosita Fire. Relishes. Sauerkraut. Salsa. Meat and Other Protein Products Salted, canned, smoked, spiced, or pickled meats, seafood, or fish. Bacon, ham, sausage, hot dogs, corned beef, chipped beef, and packaged luncheon meats. Salt pork. Jerky. Pickled herring. Anchovies, regular canned tuna, and sardines. Salted nuts. Dairy Processed cheese and cheese spreads. Cheese curds. Blue cheese and cottage cheese. Buttermilk.  Condiments Onion and garlic salt, seasoned salt, table salt, and sea salt. Canned and packaged gravies. Worcestershire sauce. Tartar sauce. Barbecue sauce. Teriyaki sauce. Soy sauce, including reduced sodium. Steak sauce. Fish sauce. Oyster sauce. Cocktail sauce. Horseradish that you find on the shelf. Regular ketchup and mustard. Meat flavorings and tenderizers. Bouillon cubes. Hot  sauce. Tabasco sauce. Marinades. Taco seasonings. Relishes. Fats and Oils Regular salad dressings. Salted butter. Margarine. Ghee. Bacon fat.  Other Potato and tortilla chips. Corn chips and puffs. Salted popcorn and pretzels. Canned or dried soups. Pizza. Frozen entrees and pot pies.  The items listed above may not be a complete list of foods and beverages to avoid. Contact your dietitian for more information.   This information is not intended to replace advice given to you by your health care provider. Make sure you discuss any questions you have with your health care provider.   Document Released: 11/18/2001 Document Revised: 06/19/2014 Document Reviewed: 04/02/2013 Elsevier Interactive Patient Education [employer]

## 2015-12-23 NOTE — Progress Notes (Signed)
Subjective:    Patient ID: Courtney Stewart, female    DOB: [birth year], 45 y.o.   MRN: [id number]  HPI Chief Complaint  Patient presents with  . hospital follow-up    hospital follow-up, med for HTN, having trouble having a BM   She is here for follow up after being discharged from the hospital yesterday for NSTEMI. She had a cardiac cath. And was discharged to follow up with primary care for management of HTN, Diabetes, hyperlipidemia and risk factors to reduce future cardiac events.  She states she has not been properly managing her health. States she thinks what happened to her may be a result of her doing a "coffee enema". States she is motivated to start taking better care of herself. Her daughter is with her today.  She denies fever, chills, dizziness, chest pain, shortness of breath, nausea, vomiting, diarrhea. Complains of constipation, has an ongoing issue with this. Has not tried Miralax yet. This was recommended to her at our last visit approximately 1 week ago. She has been trying enemas even though I recommended against this.  Her diet appears to consist of a significant amount of sodium including soy sauce, potato chips, and sea salt which she puts on her food. She states she eats a lot of fried food. States she is a vegetarian and does not eat any meat. She eats eggs daily. She did go to the nutritionist for one appointment but did not follow-up. Does not exercise.    Past Medical History  Diagnosis Date  . Diabetes mellitus without complication (HCC)   . Anemia   . Hypertension   . UC (ulcerative colitis) (HCC)         The left ventricular systolic function is normal. LVEF 55%. LVH. Normal LVEDP during coronary angiogram.  Normal coronary arteries.  Rec: Medical therapy with aggressive risk factor reduction. Suspect hypertensive heart disease.    Reviewed allergies, medications, past medical, and social history. She denies smoking, alcohol or drug  use.    Review of Systems Pertinent positives and negatives in the history of present illness.     Objective:   Physical Exam  Constitutional: She is oriented to person, place, and time. She appears well-developed and well-nourished. No distress.  Cardiovascular: Normal rate, regular rhythm, normal heart sounds and intact distal pulses.  Exam reveals no gallop and no friction rub.   No murmur heard. Pulmonary/Chest: Effort normal and breath sounds normal.  Abdominal: Soft. Bowel sounds are normal. She exhibits no distension. There is no tenderness. There is no rebound.  Neurological: She is alert and oriented to person, place, and time.  Skin: Skin is warm and dry.  Psychiatric: She has a normal mood and affect. Her behavior is normal. Judgment and thought content normal.   BP 144/100 mmHg  Pulse 72  Wt 222 lb (100.699 kg)  LMP 12/06/2015      Assessment & Plan:  NSTEMI (non-ST elevated myocardial infarction) (HCC)  Morbid obesity, unspecified obesity type (HCC) - Plan: CANCELED: Amb ref to Medical Nutrition Therapy-MNT  Essential hypertension - Plan: CBC with Differential/Platelet, Basic metabolic panel, CANCELED: Amb ref to Medical Nutrition Therapy-MNT  Diabetes mellitus without complication (HCC) - Plan: CBC with Differential/Platelet, Basic metabolic panel, CANCELED: Amb ref to Medical Nutrition Therapy-MNT  Discussed that the plan is to reduce cardiac risk factors in order to prevent further cardiac events. Discussed that her blood pressure is not well controlled on her current medication. She will stop lisinopril and start  taking Edarbyclor daily. Discussed that the new medication does have chlorthalidone in it and in the past she did have subjective dehydration when taking HCTZ. She is aware that she will need to stay well-hydrated and will report back any issues with the new medication. Educated her on using caution when first starting the medication. Advised her to bring  in her blood pressure cuff in for a nurse visit to compare it against ours.  Start taking Edarbyclor 40/12.5 for the first 2 weeks. Call and let us know what your blood pressure readings are and if you are still seeing numbers >140/80 then you can bump up to the Edarbyclor 40/25mg . Encourage strict monitoring of blood pressure and blood sugars.  She verbalized that she does not want to take medication for her cholesterol and I reiterated the importance of risk reduction by managing her chronic health conditions.  Discussed lifestyle modifications such as low-sodium low carb diet.  She is established with the medical nutritionist, had one visit, recommend that she call and schedule a follow-up appointment with them due to having a lot of questions regarding diet. Recommend getting at least 150 minutes of physical activity per week. Discussed not self-medicating and calling and letting us know if medications are not helping or if she has side effects She will keep a diary of her blood sugars and blood pressures and bring this to her next appointment in 2 weeks. Recommend that she come for a nurse visit in 2-3 days to have her blood pressure cuff compared to the one in the office.

## 2015-12-24 LAB — BASIC METABOLIC PANEL
BUN: 9 mg/dL (ref 7–25)
CALCIUM: 9.7 mg/dL (ref 8.6–10.2)
CO2: 22 mmol/L (ref 20–31)
CREATININE: 1.12 mg/dL — AB (ref 0.50–1.10)
Chloride: 104 mmol/L (ref 98–110)
Glucose, Bld: 110 mg/dL — ABNORMAL HIGH (ref 65–99)
Potassium: 4.2 mmol/L (ref 3.5–5.3)
SODIUM: 138 mmol/L (ref 135–146)

## 2015-12-27 ENCOUNTER — Other Ambulatory Visit: Payer: BLUE CROSS/BLUE SHIELD

## 2016-01-11 ENCOUNTER — Encounter: Payer: BLUE CROSS/BLUE SHIELD | Admitting: Family Medicine

## 2016-01-13 ENCOUNTER — Ambulatory Visit (INDEPENDENT_AMBULATORY_CARE_PROVIDER_SITE_OTHER): Payer: BLUE CROSS/BLUE SHIELD | Admitting: Family Medicine

## 2016-01-13 VITALS — BP 118/78 | HR 64 | Wt 217.6 lb

## 2016-01-13 DIAGNOSIS — R748 Abnormal levels of other serum enzymes: Secondary | ICD-10-CM

## 2016-01-13 DIAGNOSIS — I10 Essential (primary) hypertension: Secondary | ICD-10-CM

## 2016-01-13 DIAGNOSIS — E119 Type 2 diabetes mellitus without complications: Secondary | ICD-10-CM

## 2016-01-13 DIAGNOSIS — R7989 Other specified abnormal findings of blood chemistry: Secondary | ICD-10-CM

## 2016-01-13 LAB — BASIC METABOLIC PANEL
BUN: 19 mg/dL (ref 7–25)
CO2: 26 mmol/L (ref 20–31)
Calcium: 9.8 mg/dL (ref 8.6–10.2)
Chloride: 101 mmol/L (ref 98–110)
Creat: 1.14 mg/dL — ABNORMAL HIGH (ref 0.50–1.10)
GLUCOSE: 145 mg/dL — AB (ref 65–99)
POTASSIUM: 3.7 mmol/L (ref 3.5–5.3)
SODIUM: 139 mmol/L (ref 135–146)

## 2016-01-13 MED ORDER — AZILSARTAN-CHLORTHALIDONE 40-25 MG PO TABS: 1.0000 | ORAL_TABLET | Freq: Every day | ORAL | 2 refills | Status: DC

## 2016-01-13 MED ORDER — AZILSARTAN-CHLORTHALIDONE 40-25 MG PO TABS: 1.0000 | ORAL_TABLET | Freq: Every day | ORAL | 0 refills | Status: DC

## 2016-01-13 NOTE — Patient Instructions (Addendum)
Call and schedule your GI appointment for chronic constipation. [facility] phone number is [phone]. Please call them to schedule an appointment Address:  [address] [place], [place]  [address]  Bring your blood pressure cuff in and compare with the one in our office. Report back any blood pressure readings that are low or greater than 140/85 consistently. Continue checking daily blood sugars and report back any elevated readings.  Continue with healthy diet, low-sodium, low carbs.  Try getting at least 10-15 minutes of physical activity one or 2 times per day.

## 2016-01-13 NOTE — Progress Notes (Signed)
Subjective:    Patient ID: Courtney Stewart, female    DOB: Jan 09, 1971, 45 y.o.   MRN: [id number]  HPI Chief Complaint  Patient presents with  . Follow-up    follow-up on bp. running fine outside of office   She is here for follow-up on hypertension and diabetes.  Recently switched her blood pressure medication to [employer]. Reports on the low-dose her blood pressure at home was 140/87 and then on the higher dose medication her blood pressures have been 118-124/78-80.  She has a new blood pressure cuff. She did not bring it in to compare it to ours.  She states "this is the best I have felt in years". States she has been eating healthier. Has not started exercises yet.   States blood sugars at home have been around 101-125 fasting. She is only checking them once fasting and then 5 hours after a meal. No issues with Metformin and reports good medication compliance.   Denies fever, chills, headache, dizziness, vision disturbances, chest pain, DOE, orthopnea, abdominal pain, nausea, vomiting.  She does still complain of constipation and states she did not schedule with GI yet. She plans to call them to follow up as discussed. She has history of using enemas and laxatives and chronic constipation.   Reviewed allergies, medications, past medical, and social history.    Review of Systems Pertinent positives and negatives in the history of present illness.     Objective:   Physical Exam BP 118/78   Pulse 64   Wt 217 lb 9.6 oz (98.7 kg)   LMP 12/06/2015   BMI 39.80 kg/m   Alert and in no distress.  Cardiac exam shows a regular rhythm without murmurs or gallops. Lungs are clear to auscultation.      Assessment & Plan:  Essential hypertension - Plan: Basic metabolic panel  Diabetes mellitus without complication (HCC) - Plan: Basic metabolic panel  Morbid obesity, unspecified obesity type (HCC)  Elevated serum creatinine - Plan: Basic metabolic panel  Discussed that I would  like for her to bring her blood pressure cuff in and compare it with ours to make sure it is accurate. Her readings at home appear to be close based on her report.  BP appears to better managed on current medication and dose. Will continue on this for now. She will let me know if she notices any abnormal readings.  Discussed that this medication is not safe for pregnancy and she states she is not currently sexually active and does not plan on becoming pregnant. She is aware that this is contraindicated. Discussed that blood pressure medication may be [facility] however she would like to check with [facility] first. Samples provided today for Edarbyclor #14.   Diabetes- She will continue on current Metformin and keep an eye on her blood sugars. Diet and exercise will also help control blood sugars.  She has lost 5 lbs since changing her diet and hopes to continue with weight loss.   Will repeat BMP today due to elevated SCr in past.   Encourage strict monitoring of blood pressure and blood sugars.  She verbalized that she does not want to take medication for her cholesterol and continues to refuse treatment for this.  I reiterated the importance of risk reduction by managing her chronic health conditions.  We'll need to repeat fasting lipids in October. Discussed that I recommend starting a statin due to her diabetes and hypertension and that this significantly increases her risk  of heart disease and stroke. Follow up in October for Diabetes and HTN.  Spent a minimum of 25 minutes with patient face to face and at least 50% was in counseling and coordination of care.

## 2016-01-16 ENCOUNTER — Ambulatory Visit (HOSPITAL_COMMUNITY)
Admission: EM | Admit: 2016-01-16 | Discharge: 2016-01-16 | Disposition: A | Discharge status: Home / Self Care | Payer: BLUE CROSS/BLUE SHIELD | Attending: Family Medicine | Admitting: Family Medicine

## 2016-01-16 ENCOUNTER — Encounter (HOSPITAL_COMMUNITY): Payer: Self-pay | Admitting: *Deleted

## 2016-01-16 DIAGNOSIS — N62 Hypertrophy of breast: Secondary | ICD-10-CM | POA: Diagnosis not present

## 2016-01-16 DIAGNOSIS — M546 Pain in thoracic spine: Secondary | ICD-10-CM

## 2016-01-16 LAB — POCT URINALYSIS DIP (DEVICE)
BILIRUBIN URINE: NEGATIVE
Glucose, UA: NEGATIVE mg/dL
KETONES UR: NEGATIVE mg/dL
Leukocytes, UA: NEGATIVE
Nitrite: NEGATIVE
PH: 6 (ref 5.0–8.0)
PROTEIN: NEGATIVE mg/dL
Urobilinogen, UA: 0.2 mg/dL (ref 0.0–1.0)

## 2016-01-16 LAB — POCT PREGNANCY, URINE: Preg Test, Ur: NEGATIVE

## 2016-01-16 MED ORDER — IBUPROFEN 600 MG PO TABS: 600.0000 mg | ORAL_TABLET | Freq: Four times a day (QID) | ORAL | 0 refills | Status: DC | PRN

## 2016-01-16 MED ORDER — CYCLOBENZAPRINE HCL 7.5 MG PO TABS: 7.5000 mg | ORAL_TABLET | Freq: Three times a day (TID) | ORAL | 0 refills | Status: DC | PRN

## 2016-01-16 NOTE — ED Triage Notes (Signed)
C/O dysuria, urgency, and polyuria x approx 1 wk, worsening since yesterday.  Now having left flank pain also.  Felt feverish at home today.

## 2016-01-16 NOTE — ED Provider Notes (Signed)
[facility]    CSN: [id number] Arrival date & time: 01/16/16  1534  First Provider Contact:  First MD Initiated Contact with Patient 01/16/16 1623     History   Chief Complaint Chief Complaint  Patient presents with  . Dysuria  . Flank Pain    HPI Courtney Stewart is a 45 y.o. female presenting with mid back pain for the past 3 days.  She reports gradually noticing pain on both sides of her mid-to-upper back worse towards the end of the day. It radiates at times up her neck and is mild-moderate, and aching.She thought this could be due to a kidney infection, so she started drinking 64oz of water per day with no change in her symptoms. She denies fevers, dysuria, urinary urgency, hematuria, vaginal discharge, abd pain, N/V/D.  No medications tried or history of back problems. recently losing a lot of weight due to a healthier lifestyle as a treatment for HTN and DM.  HPI  Past Medical History:  Diagnosis Date  . Anemia   . Diabetes mellitus without complication (HCC)   . Hypertension   . UC (ulcerative colitis) (HCC)   . UTI (urinary tract infection)     Patient Active Problem List   Diagnosis Date Noted  . NSTEMI (non-ST elevated myocardial infarction) (HCC) 12/21/2015  . UTI (lower urinary tract infection) 12/21/2015  . Morbid obesity (HCC) 08/12/2015  . Diabetes mellitus without complication (HCC) 08/12/2015  . Essential hypertension 08/12/2015    Past Surgical History:  Procedure Laterality Date  . CARDIAC CATHETERIZATION N/A 12/22/2015   Procedure: Left Heart Cath and Coronary Angiography;  Surgeon: Yates Decamp, MD;  Location: [facility] INVASIVE CV LAB;  Service: Cardiovascular;  Laterality: N/A;  . CESAREAN SECTION      OB History    No data available       Home Medications    Prior to Admission medications   Medication Sig Start Date End Date Taking? Authorizing Provider  aspirin EC 81 MG tablet Take 1 tablet (81 mg total) by mouth daily. 12/22/15  Yes  Shanker Levora Dredge, MD  Azilsartan-Chlorthalidone (EDARBYCLOR) 40-25 MG TABS Take 1 tablet by mouth daily. 01/13/16  Yes Avanell Shackleton, NP  glucose blood test strip Test 4-5 times a week before eating and 3-4 times a week 2 hours after eating. Pt uses- one touch verio IQ meter 08/24/15  Yes Vickie L Suezanne Jacquet, NP  metFORMIN (GLUCOPHAGE) 500 MG tablet TAKE 1 TABLET BY MOUTH TWICE DAILY WITH A MEAL Patient taking differently: TAKE 1 TABLET BY MOUTH once DAILY WITH A MEAL 12/24/15  Yes Shanker Levora Dredge, MD  ONETOUCH DELICA LANCETS FINE MISC TEST 4 TO 5 TMES A WEEK BEFORE EATING AND 3 TO 4 TIMES A WEEK, 2 HOURS AFTER EATING 08/24/15  Yes Avanell Shackleton, NP  cyclobenzaprine (FEXMID) 7.5 MG tablet Take 1 tablet (7.5 mg total) by mouth 3 (three) times daily as needed for muscle spasms. 01/16/16   Tyrone Nine, MD  ibuprofen (ADVIL,MOTRIN) 600 MG tablet Take 1 tablet (600 mg total) by mouth every 6 (six) hours as needed. 01/16/16   Tyrone Nine, MD    Family History Family History  Problem Relation Age of Onset  . Diabetes Mother   . Hyperlipidemia Mother   . Hypertension Mother   . Cancer Father     Social History Social History  Substance Use Topics  . Smoking status: Never Smoker  . Smokeless tobacco: Not on file  .  Alcohol use No     Allergies   Latex; Peanut oil; Tomato; Amlodipine; Hydrochlorothiazide w-triamterene; Metoprolol; and Shellfish-derived products   Review of Systems Review of Systems As above  Physical Exam Triage Vital Signs ED Triage Vitals  Enc Vitals Group     BP 01/16/16 1620 112/72     Pulse Rate 01/16/16 1620 86     Resp 01/16/16 1620 16     Temp 01/16/16 1620 98.1 F (36.7 C)     Temp Source 01/16/16 1620 Oral     SpO2 01/16/16 1620 97 %     Weight --      Height --      Head Circumference --      Peak Flow --      Pain Score 01/16/16 1622 10     Pain Loc --      Pain Edu? --      Excl. in GC? --    No data found.   Updated Vital Signs BP 112/72    Pulse 86   Temp 98.1 F (36.7 C) (Oral)   Resp 16   LMP 01/09/2016 (Approximate)   SpO2 97%   Physical Exam  Constitutional: She is oriented to person, place, and time. She appears well-developed and well-nourished. No distress.  obese, well-appearing  Eyes: EOM are normal. Pupils are equal, round, and reactive to light. No scleral icterus.  Neck: Normal range of motion. Neck supple.  Cardiovascular: Normal rate, regular rhythm, normal heart sounds and intact distal pulses.   No murmur heard. Pulmonary/Chest: Effort normal and breath sounds normal. No respiratory distress.  Abdominal: Soft. Bowel sounds are normal. She exhibits no distension. There is no tenderness.  Musculoskeletal:  deep indentations of bra straps on bilateral shoulders, upper thoracic spine nontender in midline with spastic tender paraspinal musculature. No CVA tenderness.   Neurological: She is alert and oriented to person, place, and time. She exhibits normal muscle tone.  Skin: Skin is warm and dry.  Vitals reviewed.  UC Treatments / Results  Labs (all labs ordered are listed, but only abnormal results are displayed) Labs Reviewed  POCT URINALYSIS DIP (DEVICE) - Abnormal; Notable for the following:       Result Value   Hgb urine dipstick SMALL (*)    All other components within normal limits  POCT PREGNANCY, URINE    EKG  EKG Interpretation None       Radiology No results found.  Procedures Procedures (including critical care time)  Medications Ordered in UC Medications - No data to display  Initial Impression / Assessment and Plan / UC Course  I have reviewed the triage vital signs and the nursing notes.  Pertinent labs & imaging results that were available during my care of the patient were reviewed by me and considered in my medical decision making (see chart for details).  Final Clinical Impressions(s) / UC Diagnoses   Final diagnoses:  Bilateral thoracic back pain  Macromastia    45 y.o. female with macromastia causing mid thoracic back pain with paraspinal spasm. No evidence of CVA tenderness, or UTI. Will Rx NSAID and short course anti-spasmodic and recommend follow up with PCP if problem persists.   New Prescriptions New Prescriptions   CYCLOBENZAPRINE (FEXMID) 7.5 MG TABLET    Take 1 tablet (7.5 mg total) by mouth 3 (three) times daily as needed for muscle spasms.   IBUPROFEN (ADVIL,MOTRIN) 600 MG TABLET    Take 1 tablet (600 mg total) by mouth every  6 (six) hours as needed.     Tyrone Nineyan B Kasiah Manka, MD 01/16/16 [phone]

## 2016-01-17 ENCOUNTER — Ambulatory Visit: Payer: BLUE CROSS/BLUE SHIELD | Admitting: Family Medicine

## 2016-01-25 ENCOUNTER — Encounter: Payer: Self-pay | Admitting: Gastroenterology

## 2016-02-15 ENCOUNTER — Other Ambulatory Visit (HOSPITAL_COMMUNITY): Payer: Self-pay | Admitting: Internal Medicine

## 2016-03-27 ENCOUNTER — Encounter: Payer: Self-pay | Admitting: Family Medicine

## 2016-04-04 ENCOUNTER — Ambulatory Visit: Payer: BLUE CROSS/BLUE SHIELD | Admitting: Gastroenterology

## 2016-04-20 ENCOUNTER — Encounter: Payer: BLUE CROSS/BLUE SHIELD | Admitting: Family Medicine

## 2016-04-21 ENCOUNTER — Encounter: Payer: Self-pay | Admitting: Family Medicine

## 2016-05-19 ENCOUNTER — Other Ambulatory Visit: Payer: Self-pay | Admitting: Family Medicine

## 2016-07-10 ENCOUNTER — Other Ambulatory Visit: Payer: Self-pay | Admitting: Family Medicine

## 2016-07-18 ENCOUNTER — Telehealth: Payer: Self-pay | Admitting: Family Medicine

## 2016-07-20 NOTE — Telephone Encounter (Signed)
P.A. Approved til 07/18/17, left message for pt, faxed pharmacy

## 2016-08-01 ENCOUNTER — Telehealth: Payer: Self-pay | Admitting: Medical

## 2016-08-01 NOTE — Telephone Encounter (Signed)
Pt states Edarbyclor still $100 even with insurance.  Called pharmacy & she is accurate.  Went online & activated discount card, called pharmacy back & now cost is $25 per month.

## 2016-10-01 ENCOUNTER — Other Ambulatory Visit: Payer: Self-pay | Admitting: Family Medicine

## 2016-10-02 ENCOUNTER — Other Ambulatory Visit: Payer: Self-pay | Admitting: Family Medicine

## 2016-11-08 ENCOUNTER — Other Ambulatory Visit: Payer: Self-pay | Admitting: Family Medicine

## 2016-11-08 NOTE — Telephone Encounter (Signed)
Pt has an appt next week for med check.

## 2016-11-09 ENCOUNTER — Telehealth: Payer: Self-pay | Admitting: Family Medicine

## 2016-11-09 MED ORDER — AZILSARTAN-CHLORTHALIDONE 40-25 MG PO TABS: 1.0000 | ORAL_TABLET | Freq: Every day | ORAL | 0 refills | Status: DC

## 2016-11-09 NOTE — Telephone Encounter (Signed)
Called into pharmacy

## 2016-11-09 NOTE — Telephone Encounter (Signed)
Ok to switch 

## 2016-11-09 NOTE — Telephone Encounter (Signed)
Pharmacy called & states pt's insurance will not pay for 30 day fills on maintenance meds, will only pay for 90 day supply of Edarbyclor.  Is it ok to switch to 90 day supply?

## 2016-11-16 ENCOUNTER — Encounter: Payer: Self-pay | Admitting: Family Medicine

## 2016-11-16 ENCOUNTER — Ambulatory Visit (INDEPENDENT_AMBULATORY_CARE_PROVIDER_SITE_OTHER): Payer: BLUE CROSS/BLUE SHIELD | Admitting: Family Medicine

## 2016-11-16 VITALS — BP 120/84 | HR 70 | Wt 228.0 lb

## 2016-11-16 DIAGNOSIS — I214 Non-ST elevation (NSTEMI) myocardial infarction: Secondary | ICD-10-CM | POA: Diagnosis not present

## 2016-11-16 DIAGNOSIS — I10 Essential (primary) hypertension: Secondary | ICD-10-CM

## 2016-11-16 DIAGNOSIS — E119 Type 2 diabetes mellitus without complications: Secondary | ICD-10-CM

## 2016-11-16 DIAGNOSIS — Z9119 Patient's noncompliance with other medical treatment and regimen: Secondary | ICD-10-CM

## 2016-11-16 DIAGNOSIS — Z91199 Patient's noncompliance with other medical treatment and regimen due to unspecified reason: Secondary | ICD-10-CM | POA: Insufficient documentation

## 2016-11-16 LAB — CBC WITH DIFFERENTIAL/PLATELET
BASOS ABS: 67 {cells}/uL (ref 0–200)
Basophils Relative: 1 %
EOS ABS: 201 {cells}/uL (ref 15–500)
Eosinophils Relative: 3 %
HCT: 37.6 % (ref 35.0–45.0)
HEMOGLOBIN: 12.4 g/dL (ref 11.7–15.5)
LYMPHS ABS: 2412 {cells}/uL (ref 850–3900)
Lymphocytes Relative: 36 %
MCH: 27.9 pg (ref 27.0–33.0)
MCHC: 33 g/dL (ref 32.0–36.0)
MCV: 84.5 fL (ref 80.0–100.0)
MPV: 10.6 fL (ref 7.5–12.5)
Monocytes Absolute: 603 cells/uL (ref 200–950)
Monocytes Relative: 9 %
NEUTROS ABS: 3417 {cells}/uL (ref 1500–7800)
NEUTROS PCT: 51 %
Platelets: 363 10*3/uL (ref 140–400)
RBC: 4.45 MIL/uL (ref 3.80–5.10)
RDW: 14.9 % (ref 11.0–15.0)
WBC: 6.7 10*3/uL (ref 4.0–10.5)

## 2016-11-16 LAB — COMPREHENSIVE METABOLIC PANEL
ALBUMIN: 4 g/dL (ref 3.6–5.1)
ALT: 22 U/L (ref 6–29)
AST: 17 U/L (ref 10–30)
Alkaline Phosphatase: 56 U/L (ref 33–115)
BUN: 12 mg/dL (ref 7–25)
CALCIUM: 9.9 mg/dL (ref 8.6–10.2)
CHLORIDE: 101 mmol/L (ref 98–110)
CO2: 26 mmol/L (ref 20–31)
Creat: 0.92 mg/dL (ref 0.50–1.10)
Glucose, Bld: 95 mg/dL (ref 65–99)
POTASSIUM: 4 mmol/L (ref 3.5–5.3)
Sodium: 137 mmol/L (ref 135–146)
TOTAL PROTEIN: 7.7 g/dL (ref 6.1–8.1)
Total Bilirubin: 0.3 mg/dL (ref 0.2–1.2)

## 2016-11-16 LAB — POCT GLYCOSYLATED HEMOGLOBIN (HGB A1C)

## 2016-11-16 LAB — LIPID PANEL
CHOL/HDL RATIO: 5.8 ratio — AB (ref <5.0)
Cholesterol: 173 mg/dL (ref <200)
HDL: 30 mg/dL — AB (ref >50)
LDL CALC: 106 mg/dL — AB (ref <100)
Triglycerides: 185 mg/dL — ABNORMAL HIGH (ref <150)
VLDL: 37 mg/dL — ABNORMAL HIGH (ref <30)

## 2016-11-16 MED ORDER — AZILSARTAN-CHLORTHALIDONE 40-25 MG PO TABS: 1.0000 | ORAL_TABLET | Freq: Every day | ORAL | 0 refills | Status: DC

## 2016-11-16 MED ORDER — METFORMIN HCL 1000 MG PO TABS: 1000.0000 mg | ORAL_TABLET | Freq: Two times a day (BID) | ORAL | 3 refills | Status: DC

## 2016-11-16 MED ORDER — ATORVASTATIN CALCIUM 20 MG PO TABS: 20.0000 mg | ORAL_TABLET | Freq: Every day | ORAL | 2 refills | Status: DC

## 2016-11-16 NOTE — Progress Notes (Signed)
Subjective:    Patient ID: Courtney Stewart, female    DOB: 1971/03/05, 46 y.o.   MRN: [id number]  HPI Chief Complaint  Patient presents with  . follow-up    follow-up HTN, checking bp outside of here and its been 120/80   She is here for a follow up on HTN and other chronic health conditions. She did not follow up as recommended.  She is checking her BP at home. States readings have been consistently < 130/80. She did not bring in her readings.   States she cannot afford the [employer]. States it costs $30 per month at her pharmacy which is [facility]. I asked her if she has tried [facility] to check on their prices or any other pharmacies as recommended and she has not. She reports having tried multiple other medications for her HTN in the past and has not found any medications that works for her and does not cause side effects. States she cannot take Lisinopril, amlodipine or HCTZ. She is not sure which other medications she has tried but knows there are others that she cannot take.   Her last A1c was 6.3% in 12/2015. She has not followed up for diabetes.  She is not checking her blood sugars at home. States she does have supplies and knows how to do this.  Her diet appears to be poor in nutrition and high in carbs and sugar.  Oatmeal for breakfast with butter, honey and banana. States she eats a lot of rice, bread and burritos.   Taking metformin two pills a day but not consistently.   She has gained 9 lbs since her visit in August 2017.  Does not want to go to a nutritionist.  States she is not exercising.   Denies fever, chills, dizziness, vision changes, chest pain, palpitations, shortness of breath, orthopnea, cough, abdominal pain, N/V/D, urinary symptoms or LE edema.   Reviewed allergies, medications, past medical, surgical, family, and social history.    Review of Systems Pertinent positives and negatives in the history of present illness.     Objective:   Physical Exam BP 120/84   Pulse 70   Wt 228 lb (103.4 kg)   BMI 41.70 kg/m  Alert and in no distress. Pharyngeal area is normal.  Cardiac exam shows a regular sinus rhythm without murmurs or gallops. Lungs are clear to auscultation.      Assessment & Plan:  Essential hypertension - Plan: CBC with Differential/Platelet, Comprehensive metabolic panel, TSH, Lipid panel  Diabetes mellitus without complication (HCC) - Plan: Microalbumin / creatinine urine ratio, CBC with Differential/Platelet, Comprehensive metabolic panel, POCT glycosylated hemoglobin (Hb A1C), TSH, Lipid panel  Morbid obesity (HCC) - Plan: CBC with Differential/Platelet, Comprehensive metabolic panel, TSH, Lipid panel  NSTEMI (non-ST elevated myocardial infarction) [facility] [address](HCC)  Personal history of noncompliance with medical treatment, presenting hazards to health  Her BP is very close to goal range today and per patient, readings at home have been consistently <130/80. She agrees to contact other pharmacies including [facility]. She did this in the office and states the medication is affordable and she agrees to stay on the [place].  Her hemoglobin A1c today is 7.2% and in 12/2015 it was 6.3%. She now has diabetes. Counseled on increased risk of heart disease due to diabetes and the need for a statin especially since she has had a NSTEMI in the past year. She refused to take a statin in the past. She agrees to start taking one now.  Discussed that her noncompliance with medication and lifestyle management is putting her at an increased risk of worsening health. Discussed that she has gained weight, her diet is high in carbohydrates and sugar, she is not exercising and medication compliance appears to be poor in regards to her diabetes.   Offered to send her to a nutritionist to help her with healthy food choices and she refuses.  Encouraged her to reduce amount of rice, breads, pastas and sugary foods and to start exercising slowly  and gradually.  Discussed that she does not seem pleased with the information I am giving her and she states she does not like to hear that she has "all these problems".  Advised her to start checking her blood sugars at least once daily, we will increase her Metformin to 1,000 mg twice daily.  Sent prescription for atorvastatin to her pharmacy.  Recommend she get a diabetic eye exam.  Plan to bring her back in 3 months for follow up on DM and HTN.  Follow up pending labs.

## 2016-11-16 NOTE — Patient Instructions (Addendum)
Your hemoglobin A1c is 7.2% today.  Start checking your blood sugar at least once daily in the morning fasting and keep a record of your readings.  Goal fasting blood sugars are 90-130.   I am increasing your Metformin to 1,000 mg twice daily. Bring in your readings to your next visit.   Continue checking your blood pressures and if your readings are not <130/80 then let me know.   Cut back on carbohydrates (rice, potatoes, bread, pasta) and sugar. Start walking or doing your favorite exercise at least 20 minutes per day. This can be in 10 minutes intervals. Being more active will help your blood sugars and blood pressures.   Start taking the cholesterol medication to protect your heart.   Call and schedule a diabetic eye exam.    Follow up with me in 3 months or sooner if your blood sugars or blood pressures are not in control.

## 2016-11-17 LAB — MICROALBUMIN / CREATININE URINE RATIO: Creatinine, Urine: 159 mg/dL (ref 20–320)

## 2016-11-17 LAB — TSH: TSH: 3.16 mIU/L

## 2016-12-06 ENCOUNTER — Telehealth: Payer: Self-pay | Admitting: Family Medicine

## 2016-12-06 NOTE — Telephone Encounter (Signed)
Called [facility] about fax I recv'd regarding coverage of Edarbyclor, per pharmacist went thru ins for $250 for 90 days but coupon is only good for 30 days at a time.  They ran it for #30 cash pay and cost is $101.  They will inform pt

## 2017-03-02 ENCOUNTER — Other Ambulatory Visit: Payer: Self-pay | Admitting: Family Medicine

## 2017-03-02 MED ORDER — AZILSARTAN-CHLORTHALIDONE 40-25 MG PO TABS: 1.0000 | ORAL_TABLET | Freq: Every day | ORAL | 0 refills | Status: DC

## 2017-03-25 ENCOUNTER — Encounter (HOSPITAL_COMMUNITY): Payer: Self-pay | Admitting: Emergency Medicine

## 2017-03-25 ENCOUNTER — Ambulatory Visit (HOSPITAL_COMMUNITY)
Admission: EM | Admit: 2017-03-25 | Discharge: 2017-03-25 | Disposition: A | Discharge status: Home / Self Care | Payer: BLUE CROSS/BLUE SHIELD | Attending: Emergency Medicine | Admitting: Emergency Medicine

## 2017-03-25 DIAGNOSIS — E119 Type 2 diabetes mellitus without complications: Secondary | ICD-10-CM | POA: Diagnosis not present

## 2017-03-25 DIAGNOSIS — IMO0001 Reserved for inherently not codable concepts without codable children: Secondary | ICD-10-CM

## 2017-03-25 DIAGNOSIS — T6291XA Toxic effect of unspecified noxious substance eaten as food, accidental (unintentional), initial encounter: Secondary | ICD-10-CM

## 2017-03-25 DIAGNOSIS — R197 Diarrhea, unspecified: Secondary | ICD-10-CM | POA: Diagnosis not present

## 2017-03-25 DIAGNOSIS — R112 Nausea with vomiting, unspecified: Secondary | ICD-10-CM

## 2017-03-25 LAB — GLUCOSE, CAPILLARY: GLUCOSE-CAPILLARY: 74 mg/dL (ref 65–99)

## 2017-03-25 MED ORDER — PROMETHAZINE HCL 25 MG PO TABS: 25.0000 mg | ORAL_TABLET | Freq: Four times a day (QID) | ORAL | 0 refills | Status: DC | PRN

## 2017-03-25 NOTE — ED Notes (Signed)
CBG 74  

## 2017-03-25 NOTE — ED Triage Notes (Signed)
Pt sts vomiting last night after drinking spoiled milk; pt sts vomited x 1 episode; pt sts some soreness in throat today

## 2017-03-25 NOTE — Discharge Instructions (Signed)
Make sure you work to not become dehydrated.  Ice chips and fluids as much as you can tolerate.

## 2017-03-25 NOTE — ED Provider Notes (Signed)
[facility]    CSN: [id number] Arrival date & time: 03/25/17  1336    History   Chief Complaint Chief Complaint  Patient presents with  . Emesis    HPI   HPI Courtney Stewart is a 46 y.o. female.  Patient states that she normally drinks evaporated milk is that it sits better with her stomach. Patient states that she grabbed some evaporated milk yesterday and has had persistent vomiting since then. Patient states that she has a history of hypertension and diabetes and has been able to take her medications or eat or drink anything since yesterday.  Past Medical History:  Diagnosis Date  . Anemia   . Diabetes mellitus without complication (HCC)   . Hypertension   . NSTEMI (non-ST elevated myocardial infarction) (HCC)   . UC (ulcerative colitis) (HCC)   . UTI (urinary tract infection)     Patient Active Problem List   Diagnosis Date Noted  . Personal history of noncompliance with medical treatment, presenting hazards to health 11/16/2016  . NSTEMI (non-ST elevated myocardial infarction) (HCC) 12/21/2015  . UTI (lower urinary tract infection) 12/21/2015  . Morbid obesity (HCC) 08/12/2015  . Diabetes mellitus without complication (HCC) 08/12/2015  . Essential hypertension 08/12/2015    Past Surgical History:  Procedure Laterality Date  . CARDIAC CATHETERIZATION N/A 12/22/2015   Procedure: Left Heart Cath and Coronary Angiography;  Surgeon: Yates Decamp, MD;  Location: [facility] INVASIVE CV LAB;  Service: Cardiovascular;  Laterality: N/A;  . CESAREAN SECTION      OB History    No data available       Home Medications    Prior to Admission medications   Medication Sig Start Date End Date Taking? Authorizing Provider  aspirin EC 81 MG tablet Take 1 tablet (81 mg total) by mouth daily. 12/22/15   Ghimire, Werner Lean, MD  atorvastatin (LIPITOR) 20 MG tablet Take 1 tablet (20 mg total) by mouth daily. 11/16/16   Henson, Vickie L, NP-C  Azilsartan-Chlorthalidone (EDARBYCLOR)  40-25 MG TABS Take 1 tablet by mouth daily. 03/02/17   Henson, Vickie L, NP-C  glucose blood test strip Test 4-5 times a week before eating and 3-4 times a week 2 hours after eating. Pt uses- one touch verio IQ meter 08/24/15   Henson, Vickie L, NP-C  metFORMIN (GLUCOPHAGE) 1000 MG tablet Take 1 tablet (1,000 mg total) by mouth 2 (two) times daily with a meal. 11/16/16   Henson, Vickie L, NP-C  ONETOUCH DELICA LANCETS FINE MISC TEST 4 TO 5 TMES A WEEK BEFORE EATING AND 3 TO 4 TIMES A WEEK, 2 HOURS AFTER EATING 08/24/15   Henson, Vickie L, NP-C  promethazine (PHENERGAN) 25 MG tablet Take 1 tablet (25 mg total) by mouth every 6 (six) hours as needed for nausea or vomiting. 03/25/17   Servando Salina, NP    Family History Family History  Problem Relation Age of Onset  . Diabetes Mother   . Hyperlipidemia Mother   . Hypertension Mother   . Cancer Father     Social History Social History  Substance Use Topics  . Smoking status: Never Smoker  . Smokeless tobacco: Never Used  . Alcohol use No     Allergies   Latex; Peanut oil; Tomato; Amlodipine; Hydrochlorothiazide w-triamterene; Metoprolol; and Shellfish-derived products   Review of Systems Review of Systems  Constitutional: Negative.  Negative for chills, fatigue and fever.  HENT: Negative.  Negative for congestion.   Eyes: Negative.  Negative for  visual disturbance.  Respiratory: Negative.  Negative for cough and shortness of breath.   Cardiovascular: Negative.  Negative for chest pain and leg swelling.  Gastrointestinal: Positive for diarrhea, nausea and vomiting. Negative for abdominal distention, abdominal pain, blood in stool and constipation.  Endocrine: Negative.   Genitourinary: Negative.  Negative for difficulty urinating and frequency.  Musculoskeletal: Negative.  Negative for gait problem and neck stiffness.  Skin: Negative.  Negative for rash and wound.  Allergic/Immunologic: Negative.   Neurological: Negative.   Negative for dizziness and headaches.  Hematological: Negative.   Psychiatric/Behavioral: Negative.      Physical Exam Triage Vital Signs ED Triage Vitals [03/25/17 1355]  Enc Vitals Group     BP (!) 160/91     Pulse Rate 60     Resp 18     Temp 98.1 F (36.7 C)     Temp Source Oral     SpO2 98 %     Weight      Height      Head Circumference      Peak Flow      Pain Score 10     Pain Loc      Pain Edu?      Excl. in GC?    No data found.   Updated Vital Signs BP (!) 160/91 (BP Location: Left Arm)   Pulse 60   Temp 98.1 F (36.7 C) (Oral)   Resp 18   SpO2 98%    Physical Exam  Constitutional: She appears well-developed and well-nourished. No distress.  HENT:  Mouth/Throat: Oropharynx is clear and moist.  Eyes: Pupils are equal, round, and reactive to light. Conjunctivae are normal. Right eye exhibits no discharge. Left eye exhibits no discharge. No scleral icterus.  Neck: Normal range of motion. Neck supple. No thyromegaly present.  Cardiovascular: Normal rate, regular rhythm, normal heart sounds and intact distal pulses.  Exam reveals no gallop and no friction rub.   No murmur heard. Pulmonary/Chest: Effort normal and breath sounds normal. No respiratory distress. She has no wheezes. She has no rales. She exhibits no tenderness.  Abdominal: Soft. She exhibits no distension and no mass. There is no tenderness. There is no rebound and no guarding. No hernia.  Bowel sounds are hyperactive in all 4 quadrants.  Skin: Skin is warm and dry. She is not diaphoretic.  Nursing note and vitals reviewed.    UC Treatments / Results  Labs (all labs ordered are listed, but only abnormal results are displayed) Labs Reviewed  GLUCOSE, CAPILLARY   Results for orders placed or performed during the hospital encounter of 03/25/17  Glucose, capillary  Result Value Ref Range   Glucose-Capillary 74 65 - 99 mg/dL    EKG  EKG Interpretation None       Radiology No  results found.  Procedures Procedures (including critical care time)  Medications Ordered in UC Medications - No data to display   Initial Impression / Assessment and Plan / UC Course  I have reviewed the triage vital signs and the nursing notes.  Pertinent labs & imaging results that were available during my care of the patient were reviewed by me and considered in my medical decision making (see chart for details).     My initial impression is food poisoning. Patient does not appear to be dehydrated at this time.  Will check her blood sugar as she is diabetic and has been unable to take her medications or eat or drink.  Final  Clinical Impressions(s) / UC Diagnoses   Final diagnoses:  Food poisoning, accidental or unintentional, initial encounter    New Prescriptions Discharge Medication List as of 03/25/2017  2:32 PM    START taking these medications   Details  promethazine (PHENERGAN) 25 MG tablet Take 1 tablet (25 mg total) by mouth every 6 (six) hours as needed for nausea or vomiting., Starting Sun 03/25/2017, Normal         Controlled Substance Prescriptions [place] Controlled Substance Registry consulted? Not Applicable   The usual and customary discharge instructions and warnings were given.  The patient verbalizes understanding and agrees to plan of care.      Servando Salina, NP 03/25/17 1505

## 2017-04-09 ENCOUNTER — Telehealth: Payer: Self-pay | Admitting: Internal Medicine

## 2017-04-09 NOTE — Telephone Encounter (Signed)
Left message for pt to call me back.  Trying to find out if she is taking her metformin as prescribed

## 2017-06-19 ENCOUNTER — Telehealth: Payer: Self-pay | Admitting: General Practice

## 2017-06-22 ENCOUNTER — Encounter: Payer: Self-pay | Admitting: *Deleted

## 2017-07-08 ENCOUNTER — Telehealth: Payer: Self-pay | Admitting: Family Medicine

## 2017-07-08 NOTE — Telephone Encounter (Signed)
Pt has been dismissed from [facility]

## 2017-07-10 ENCOUNTER — Encounter: Payer: Self-pay | Admitting: *Deleted

## 2017-07-29 ENCOUNTER — Other Ambulatory Visit: Payer: Self-pay

## 2017-07-29 ENCOUNTER — Ambulatory Visit (HOSPITAL_COMMUNITY)
Admission: EM | Admit: 2017-07-29 | Discharge: 2017-07-29 | Disposition: A | Discharge status: Home / Self Care | Payer: BLUE CROSS/BLUE SHIELD | Attending: Internal Medicine | Admitting: Internal Medicine

## 2017-07-29 ENCOUNTER — Encounter (HOSPITAL_COMMUNITY): Payer: Self-pay | Admitting: Emergency Medicine

## 2017-07-29 DIAGNOSIS — I10 Essential (primary) hypertension: Secondary | ICD-10-CM

## 2017-07-29 DIAGNOSIS — J019 Acute sinusitis, unspecified: Secondary | ICD-10-CM

## 2017-07-29 MED ORDER — AMOXICILLIN-POT CLAVULANATE 875-125 MG PO TABS: 1.0000 | ORAL_TABLET | Freq: Two times a day (BID) | ORAL | 0 refills | Status: DC

## 2017-07-29 NOTE — Discharge Instructions (Addendum)
No danger signs on exam tonight--think elevated blood pressure may be related more to ongoing sinus infection and feeling poorly.  Prescription for amoxicillin/clavulanate (antibiotic) was sent to the pharmacy.  Followup with your primary care provider in a couple weeks to recheck blood pressure and discuss any additional treatment needed.

## 2017-07-29 NOTE — ED Triage Notes (Signed)
Pt was at work and she was feeling sleepy at work and decided to take her blood pressure and it was 190/100. Pt has HTN, takes her blood pressure medicine like shes supposed to. Denies headache

## 2017-08-01 NOTE — ED Provider Notes (Signed)
[facility]    CSN: [id number] Arrival date & time: 07/29/17  1943     History   Chief Complaint Chief Complaint  Patient presents with  . Hypertension    HPI Courtney Stewart is a 47 y.o. female. Feeling pretty tired at work today, checked bp and it was high.  Reports taking her bp med, losartan, but not on a diuretic due to reaction/side effect.  No CP, not short of breath, no headache.   Cold symptoms (head congestion, runny nose, some cough) for a couple weeks, not improving with mucinex DM and BC powders.  No fever.  Does have malaise.  HPI  Past Medical History:  Diagnosis Date  . Anemia   . Diabetes mellitus without complication (HCC)   . Hypertension   . NSTEMI (non-ST elevated myocardial infarction) (HCC)   . UC (ulcerative colitis) (HCC)   . UTI (urinary tract infection)     Patient Active Problem List   Diagnosis Date Noted  . Personal history of noncompliance with medical treatment, presenting hazards to health 11/16/2016  . NSTEMI (non-ST elevated myocardial infarction) (HCC) 12/21/2015  . UTI (lower urinary tract infection) 12/21/2015  . Morbid obesity (HCC) 08/12/2015  . Diabetes mellitus without complication (HCC) 08/12/2015  . Essential hypertension 08/12/2015    Past Surgical History:  Procedure Laterality Date  . CARDIAC CATHETERIZATION N/A 12/22/2015   Procedure: Left Heart Cath and Coronary Angiography;  Surgeon: Yates DecampJay Ganji, MD;  [facility];  Service: Cardiovascular;  Laterality: N/A;  . CESAREAN SECTION       Home Medications    Prior to Admission medications   Medication Sig Start Date End Date Taking? Authorizing Provider  glucose blood test strip Test 4-5 times a week before eating and 3-4 times a week 2 hours after eating. Pt uses- one touch verio IQ meter 08/24/15  Yes Henson, Vickie L, NP-C  losartan (COZAAR) 50 MG tablet Take 50 mg by mouth daily.   Yes [provider]  metFORMIN (GLUCOPHAGE) 1000 MG  tablet Take 1 tablet (1,000 mg total) by mouth 2 (two) times daily with a meal. 11/16/16  Yes Henson, Vickie L, NP-C  ONETOUCH DELICA LANCETS FINE MISC TEST 4 TO 5 TMES A WEEK BEFORE EATING AND 3 TO 4 TIMES A WEEK, 2 HOURS AFTER EATING 08/24/15  Yes Henson, Vickie L, NP-C  amoxicillin-clavulanate (AUGMENTIN) 875-125 MG tablet Take 1 tablet by mouth every 12 (twelve) hours. 07/29/17   Isa RankinMurray, Kiandra Sanguinetti Wilson, MD  aspirin EC 81 MG tablet Take 1 tablet (81 mg total) by mouth daily. 12/22/15   Ghimire, Werner LeanShanker M, MD  promethazine (PHENERGAN) 25 MG tablet Take 1 tablet (25 mg total) by mouth every 6 (six) hours as needed for nausea or vomiting. 03/25/17   Servando Salinaossi, Catherine H, NP    Family History Family History  Problem Relation Age of Onset  . Diabetes Mother   . Hyperlipidemia Mother   . Hypertension Mother   . Cancer Father     Social History Social History   Tobacco Use  . Smoking status: Never Smoker  . Smokeless tobacco: Never Used  Substance Use Topics  . Alcohol use: No    Alcohol/week: 0.0 oz  . Drug use: No     Allergies   Latex; Peanut oil; Tomato; Amlodipine; Hydrochlorothiazide w-triamterene; Metoprolol; and Shellfish-derived products   Review of Systems Review of Systems  All other systems reviewed and are negative.    Physical Exam Triage Vital Signs  ED Triage Vitals [07/29/17 1949]  Enc Vitals Group     BP (!) 191/105     Pulse Rate 79     Resp 18     Temp 98 F (36.7 C)     Temp src      SpO2 100 %     Weight      Height      Head Circumference      Peak Flow      Pain Score      Pain Loc      Pain Edu?      Excl. in GC?    No data found.  Updated Vital Signs BP (!) 179/88   Pulse 79   Temp 98 F (36.7 C)   Resp 18   SpO2 100%   Visual Acuity Right Eye Distance:   Left Eye Distance:   Bilateral Distance:    Right Eye Near:   Left Eye Near:    Bilateral Near:     Physical Exam  Constitutional: She is oriented to person, place, and time.  No distress.  Looks tired.  Mouth breathing, voice sounds very congested  HENT:  Head: Atraumatic.  B TMs marked dullness, not red Marked nasal congestion Throat slightly injected with post nasal drainage evident  Eyes:  Conjugate gaze observed, no eye redness/discharge  Neck: Neck supple.  Cardiovascular: Normal rate and regular rhythm.  Pulmonary/Chest: No respiratory distress. She has no wheezes. She has no rales.  Lungs clear, symmetric breath sounds   Abdominal: She exhibits no distension.  Musculoskeletal: Normal range of motion.  Neurological: She is alert and oriented to person, place, and time.  Skin: Skin is warm and dry.  Nursing note and vitals reviewed.    UC Treatments / Results   Procedures Procedures (including critical care time) None today  Final Clinical Impressions(s) / UC Diagnoses   Final diagnoses:  Acute sinusitis with symptoms > 10 days  Elevated blood pressure reading with diagnosis of hypertension   No danger signs on exam tonight--think elevated blood pressure may be related more to ongoing sinus infection and feeling poorly.  Prescription for amoxicillin/clavulanate (antibiotic) was sent to the pharmacy.  Followup with your primary care provider in a couple weeks to recheck blood pressure and discuss any additional treatment needed.    ED Discharge Orders        Ordered    amoxicillin-clavulanate (AUGMENTIN) 875-125 MG tablet  Every 12 hours     07/29/17 2036         Isa Rankin, MD 08/01/17 1235

## 2017-08-07 ENCOUNTER — Encounter: Payer: BLUE CROSS/BLUE SHIELD | Admitting: Family Medicine

## 2017-11-14 ENCOUNTER — Other Ambulatory Visit: Payer: Self-pay

## 2017-11-14 ENCOUNTER — Emergency Department (HOSPITAL_COMMUNITY)
Admission: EM | Admit: 2017-11-14 | Discharge: 2017-11-14 | Discharge status: Against Medical Advice | Payer: BLUE CROSS/BLUE SHIELD | Source: Home / Self Care

## 2017-11-14 ENCOUNTER — Encounter (HOSPITAL_COMMUNITY): Payer: Self-pay | Admitting: *Deleted

## 2017-11-14 DIAGNOSIS — Z7984 Long term (current) use of oral hypoglycemic drugs: Secondary | ICD-10-CM | POA: Insufficient documentation

## 2017-11-14 DIAGNOSIS — Z79899 Other long term (current) drug therapy: Secondary | ICD-10-CM | POA: Diagnosis not present

## 2017-11-14 DIAGNOSIS — E119 Type 2 diabetes mellitus without complications: Secondary | ICD-10-CM | POA: Diagnosis not present

## 2017-11-14 DIAGNOSIS — I10 Essential (primary) hypertension: Secondary | ICD-10-CM | POA: Diagnosis not present

## 2017-11-14 DIAGNOSIS — K5900 Constipation, unspecified: Secondary | ICD-10-CM | POA: Insufficient documentation

## 2017-11-14 DIAGNOSIS — R109 Unspecified abdominal pain: Secondary | ICD-10-CM | POA: Diagnosis present

## 2017-11-14 LAB — I-STAT BETA HCG BLOOD, ED (MC, WL, AP ONLY): I-stat hCG, quantitative: <5 m[IU]/mL (ref <5)

## 2017-11-14 NOTE — ED Notes (Signed)
No answer x3

## 2017-11-14 NOTE — ED Notes (Signed)
Pt out of BR now stating "had a large BM and was able to pee."

## 2017-11-14 NOTE — ED Triage Notes (Signed)
Pt stated "I've been constipated and now I have the urge to pee and can't."  Last BM x 2 days ago.

## 2017-11-15 ENCOUNTER — Emergency Department (HOSPITAL_COMMUNITY): Payer: BLUE CROSS/BLUE SHIELD

## 2017-11-15 ENCOUNTER — Emergency Department (HOSPITAL_COMMUNITY)
Admission: EM | Admit: 2017-11-15 | Discharge: 2017-11-15 | Disposition: A | Discharge status: Home / Self Care | Payer: BLUE CROSS/BLUE SHIELD | Attending: Emergency Medicine | Admitting: Emergency Medicine

## 2017-11-15 DIAGNOSIS — K5900 Constipation, unspecified: Secondary | ICD-10-CM

## 2017-11-15 DIAGNOSIS — R109 Unspecified abdominal pain: Secondary | ICD-10-CM

## 2017-11-15 LAB — CBC
HCT: 38.2 % (ref 36.0–46.0)
Hemoglobin: 12.2 g/dL (ref 12.0–15.0)
MCH: 27.8 pg (ref 26.0–34.0)
MCHC: 31.9 g/dL (ref 30.0–36.0)
MCV: 87 fL (ref 78.0–100.0)
PLATELETS: 415 10*3/uL — AB (ref 150–400)
RBC: 4.39 MIL/uL (ref 3.87–5.11)
RDW: 14.2 % (ref 11.5–15.5)
WBC: 11.9 10*3/uL — AB (ref 4.0–10.5)

## 2017-11-15 LAB — COMPREHENSIVE METABOLIC PANEL
ALK PHOS: 62 U/L (ref 38–126)
ALT: 16 U/L (ref 14–54)
AST: 20 U/L (ref 15–41)
Albumin: 4.1 g/dL (ref 3.5–5.0)
Anion gap: 9 (ref 5–15)
BILIRUBIN TOTAL: 0.5 mg/dL (ref 0.3–1.2)
BUN: 17 mg/dL (ref 6–20)
CALCIUM: 9.4 mg/dL (ref 8.9–10.3)
CHLORIDE: 107 mmol/L (ref 101–111)
CO2: 24 mmol/L (ref 22–32)
CREATININE: 0.99 mg/dL (ref 0.44–1.00)
GFR calc Af Amer: >60 mL/min (ref >60)
Glucose, Bld: 111 mg/dL — ABNORMAL HIGH (ref 65–99)
Potassium: 4.2 mmol/L (ref 3.5–5.1)
Sodium: 140 mmol/L (ref 135–145)
Total Protein: 8.1 g/dL (ref 6.5–8.1)

## 2017-11-15 LAB — LIPASE, BLOOD: Lipase: 50 U/L (ref 11–51)

## 2017-11-15 MED ORDER — MINERAL OIL RE ENEM
1.0000 | ENEMA | Freq: Once | RECTAL | Status: AC
  Administered 2017-11-15: 1 via RECTAL
  Filled 2017-11-15: qty 1

## 2017-11-15 NOTE — ED Provider Notes (Signed)
[facility]-EMERGENCY DEPT Provider Note  CSN: [id number] Arrival date & time: 11/14/17 2236  Chief Complaint(s) Abdominal Pain  HPI Courtney Stewart is a 47 y.o. female with a history of constipation presents to the emergency department with rectal pain and tenesmus.  Patient reports that this began a few hours prior to arrival.  States that she ate dinner and tried to have a bowel movement however no stool would pass.  States that her last bowel movement was 2 days ago.  She attempted to disimpact herself and loosen some of the stool.  Attempted 2 different types of enemas without success.  She is endorsing abdominal bloating but no pain or cramping.  She endorses some nausea without emesis.  No chest pain or shortness of breath.  She reports that she had difficulty voiding but denies dysuria.  Once here, she was able to void without complication.   HPI  Past Medical History Past Medical History:  Diagnosis Date  . Anemia   . Diabetes mellitus without complication (HCC)   . Hypertension   . NSTEMI (non-ST elevated myocardial infarction) (HCC)   . UC (ulcerative colitis) (HCC)   . UTI (urinary tract infection)    Patient Active Problem List   Diagnosis Date Noted  . Personal history of noncompliance with medical treatment, presenting hazards to health 11/16/2016  . NSTEMI (non-ST elevated myocardial infarction) (HCC) 12/21/2015  . UTI (lower urinary tract infection) 12/21/2015  . Morbid obesity (HCC) 08/12/2015  . Diabetes mellitus without complication (HCC) 08/12/2015  . Essential hypertension 08/12/2015   Home Medication(s) Prior to Admission medications   Medication Sig Start Date End Date Taking? Authorizing Provider  amoxicillin-clavulanate (AUGMENTIN) 875-125 MG tablet Take 1 tablet by mouth every 12 (twelve) hours. 07/29/17   Isa Rankin, MD  aspirin EC 81 MG tablet Take 1 tablet (81 mg total) by mouth daily. 12/22/15   Ghimire, Werner Lean, MD    glucose blood test strip Test 4-5 times a week before eating and 3-4 times a week 2 hours after eating. Pt uses- one touch verio IQ meter 08/24/15   Henson, Vickie L, NP-C  losartan (COZAAR) 50 MG tablet Take 50 mg by mouth daily.    [provider]  metFORMIN (GLUCOPHAGE) 1000 MG tablet Take 1 tablet (1,000 mg total) by mouth 2 (two) times daily with a meal. 11/16/16   Henson, Vickie L, NP-C  ONETOUCH DELICA LANCETS FINE MISC TEST 4 TO 5 TMES A WEEK BEFORE EATING AND 3 TO 4 TIMES A WEEK, 2 HOURS AFTER EATING 08/24/15   Henson, Vickie L, NP-C  promethazine (PHENERGAN) 25 MG tablet Take 1 tablet (25 mg total) by mouth every 6 (six) hours as needed for nausea or vomiting. 03/25/17   Servando Salina, NP  Past Surgical History Past Surgical History:  Procedure Laterality Date  . CARDIAC CATHETERIZATION N/A 12/22/2015   Procedure: Left Heart Cath and Coronary Angiography;  Surgeon: Yates Decamp, MD;  Location: [facility] INVASIVE CV LAB;  Service: Cardiovascular;  Laterality: N/A;  . CESAREAN SECTION     Family History Family History  Problem Relation Age of Onset  . Diabetes Mother   . Hyperlipidemia Mother   . Hypertension Mother   . Cancer Father     Social History Social History   Tobacco Use  . Smoking status: Never Smoker  . Smokeless tobacco: Never Used  Substance Use Topics  . Alcohol use: No    Alcohol/week: 0.0 oz  . Drug use: No   Allergies Latex; Peanut oil; Tomato; Amlodipine; Hydrochlorothiazide w-triamterene; Metoprolol; and Shellfish-derived products  Review of Systems Review of Systems All other systems are reviewed and are negative for acute change except as noted in the HPI  Physical Exam Vital Signs  I have reviewed the triage vital signs BP (!) 183/108 (BP Location: Left Arm)   Pulse 91   Temp 98 F (36.7 C) (Oral)   Resp 18    Ht 5\' 3"  (1.6 m)   Wt 102.1 kg (225 lb)   LMP 11/14/2017 Comment: Neg preg test 11/14/17  SpO2 100%   BMI 39.86 kg/m   Physical Exam  Constitutional: She is oriented to person, place, and time. She appears well-developed and well-nourished. No distress.  HENT:  Head: Normocephalic and atraumatic.  Right Ear: External ear normal.  Left Ear: External ear normal.  Nose: Nose normal.  Eyes: Conjunctivae and EOM are normal. No scleral icterus.  Neck: Normal range of motion and phonation normal.  Cardiovascular: Normal rate and regular rhythm.  Pulmonary/Chest: Effort normal. No stridor. No respiratory distress.  Abdominal: She exhibits no distension. There is no tenderness. There is no rigidity, no rebound, no guarding and no CVA tenderness.  Genitourinary: Rectal exam shows internal hemorrhoid and tenderness. Rectal exam shows no external hemorrhoid, no fissure and anal tone normal.  Genitourinary Comments: Chaperone present during pelvic exam.  Patient with stool in the rectal vault that was easily broken up.  Musculoskeletal: Normal range of motion. She exhibits no edema.  Neurological: She is alert and oriented to person, place, and time.  Skin: She is not diaphoretic.  Psychiatric: She has a normal mood and affect. Her behavior is normal.  Vitals reviewed.   ED Results and Treatments Labs (all labs ordered are listed, but only abnormal results are displayed) Labs Reviewed  COMPREHENSIVE METABOLIC PANEL - Abnormal; Notable for the following components:      Result Value   Glucose, Bld 111 (*)    All other components within normal limits  CBC - Abnormal; Notable for the following components:   WBC 11.9 (*)    Platelets 415 (*)    All other components within normal limits  LIPASE, BLOOD  URINALYSIS, ROUTINE W REFLEX MICROSCOPIC  I-STAT BETA HCG BLOOD, ED ([facility], [facility], [facility] ONLY)  EKG  EKG Interpretation  Date/Time:    Ventricular Rate:    PR Interval:    QRS Duration:   QT Interval:    QTC Calculation:   R Axis:     Text Interpretation:        Radiology Dg Abd Acute W/chest  Result Date: 11/15/2017 CLINICAL DATA:  Abdominal pain. EXAM: DG ABDOMEN ACUTE W/ 1V CHEST COMPARISON:  Chest radiograph 12/21/2015 FINDINGS: The cardiomediastinal contours are normal. Minimal scarring in the left mid lung. The lungs are otherwise clear. There is no free intra-abdominal air. No dilated bowel loops to suggest obstruction. Moderate colonic stool burden. Air-filled bowel in the central abdomen likely transverse colon. There is stool distending the rectum, spanning 7.6 cm. Right pelvic calcifications are likely phleboliths. No radiopaque calculi. No acute osseous abnormalities are seen. IMPRESSION: 1. Moderate colonic stool burden with stool distending the rectum, consistent with constipation. No bowel obstruction or free air. 2. Minimal left lung scarring without acute chest finding. Electronically Signed   By: Rubye Oaks M.D.   On: 11/15/2017 02:15   Pertinent labs & imaging results that were available during my care of the patient were reviewed by me and considered in my medical decision making (see chart for details).  Medications Ordered in ED Medications  mineral oil enema 1 enema (1 enema Rectal Given 11/15/17 0235)                                                                                                                                    Procedures Fecal disimpaction Date/Time: 11/15/2017 2:50 AM Performed by: Nira Conn, MD Authorized by: Nira Conn, MD  Consent: Verbal consent obtained. Consent given by: patient Patient understanding: patient states understanding of the procedure being performed Time out: Immediately prior to procedure a "time out" was called to verify the correct patient, procedure, equipment, support  staff and site/side marked as required. Local anesthesia used: no  Anesthesia: Local anesthesia used: no  Sedation: Patient sedated: no  Patient tolerance: Patient tolerated the procedure well with no immediate complications     (including critical care time)  Medical Decision Making / ED Course I have reviewed the nursing notes for this encounter and the patient's prior records (if available in EHR or on provided paperwork).    Screening labs w/ mild leukocytosis, but otherwise grossly reassuring w/o significant electrolyte derangements or renal insufficiency.  hCG negative.  Acute abdominal series consistent with moderate colonic stool burden and distended rectum.  No evidence of obstruction.  Disimpacted as above.  Provided with mineral oil enema.  Will reassess.  Patient was able to have a large bowel movement in the emergency department.  No bleeding.  Significant improvement of her symptomatology.  The patient appears reasonably screened and/or stabilized for discharge and I doubt any other medical condition or other [facility] requiring further screening, evaluation, or treatment in the ED at this time  prior to discharge.  The patient is safe for discharge with strict return precautions.   Final Clinical Impression(s) / ED Diagnoses Final diagnoses:  Abdominal pain  Constipation, unspecified constipation type   Disposition: Discharge  Condition: Good  I have discussed the results, Dx and Tx plan with the patient who expressed understanding and agree(s) with the plan. Discharge instructions discussed at great length. The patient was given strict return precautions who verbalized understanding of the instructions. No further questions at time of discharge.    ED Discharge Orders    None       Follow Up: [facility] care provider  Schedule an appointment as soon as possible for a visit  As needed      This chart was dictated using voice recognition software.   Despite best efforts to proofread,  errors can occur which can change the documentation meaning.   Nira Connardama, Pedro Eduardo, MD 11/15/17 0300

## 2017-11-15 NOTE — ED Notes (Signed)
Pt. Made aware for the need of urine specimen. 

## 2018-04-30 ENCOUNTER — Ambulatory Visit: Payer: BLUE CROSS/BLUE SHIELD | Admitting: Family Medicine

## 2018-05-03 ENCOUNTER — Encounter (HOSPITAL_COMMUNITY): Payer: Self-pay

## 2018-05-03 ENCOUNTER — Other Ambulatory Visit: Payer: Self-pay

## 2018-05-03 ENCOUNTER — Emergency Department (HOSPITAL_COMMUNITY)
Admission: EM | Admit: 2018-05-03 | Discharge: 2018-05-03 | Disposition: A | Discharge status: Home / Self Care | Payer: Self-pay | Attending: Emergency Medicine | Admitting: Emergency Medicine

## 2018-05-03 DIAGNOSIS — L301 Dyshidrosis [pompholyx]: Secondary | ICD-10-CM | POA: Insufficient documentation

## 2018-05-03 DIAGNOSIS — I252 Old myocardial infarction: Secondary | ICD-10-CM | POA: Insufficient documentation

## 2018-05-03 DIAGNOSIS — I10 Essential (primary) hypertension: Secondary | ICD-10-CM | POA: Insufficient documentation

## 2018-05-03 DIAGNOSIS — Z91010 Allergy to peanuts: Secondary | ICD-10-CM | POA: Insufficient documentation

## 2018-05-03 DIAGNOSIS — Z7984 Long term (current) use of oral hypoglycemic drugs: Secondary | ICD-10-CM | POA: Insufficient documentation

## 2018-05-03 DIAGNOSIS — Z91040 Latex allergy status: Secondary | ICD-10-CM | POA: Insufficient documentation

## 2018-05-03 DIAGNOSIS — Z79899 Other long term (current) drug therapy: Secondary | ICD-10-CM | POA: Insufficient documentation

## 2018-05-03 DIAGNOSIS — E119 Type 2 diabetes mellitus without complications: Secondary | ICD-10-CM | POA: Insufficient documentation

## 2018-05-03 LAB — CBG MONITORING, ED: Glucose-Capillary: 110 mg/dL — ABNORMAL HIGH (ref 70–99)

## 2018-05-03 MED ORDER — METHYLPREDNISOLONE SODIUM SUCC 125 MG IJ SOLR
125.0000 mg | Freq: Once | INTRAMUSCULAR | Status: AC
  Administered 2018-05-03: 125 mg via INTRAVENOUS
  Filled 2018-05-03: qty 2

## 2018-05-03 MED ORDER — PREDNISONE 20 MG PO TABS: 40.0000 mg | ORAL_TABLET | Freq: Every day | ORAL | 0 refills | Status: DC

## 2018-05-03 MED ORDER — DIPHENHYDRAMINE HCL 50 MG/ML IJ SOLN
50.0000 mg | Freq: Once | INTRAMUSCULAR | Status: AC
  Administered 2018-05-03: 50 mg via INTRAMUSCULAR
  Filled 2018-05-03: qty 1

## 2018-05-03 MED ORDER — TRIAMCINOLONE 0.1 % CREAM:EUCERIN CREAM 1:1
TOPICAL_CREAM | Freq: Three times a day (TID) | CUTANEOUS | Status: DC | PRN
Start: 2018-05-03 — End: 2018-05-03
  Filled 2018-05-03 (×2): qty 1

## 2018-05-03 NOTE — ED Notes (Signed)
Patient verbalizes understanding of discharge instructions. Opportunity for questioning and answers were provided. Armband removed by staff, pt discharged from ED.  

## 2018-05-03 NOTE — ED Provider Notes (Signed)
[facility] EMERGENCY DEPARTMENT Provider Note   CSN: [id number] Arrival date & time: 05/03/18  1421     History   Chief Complaint Chief Complaint  Patient presents with  . Rash    HPI Jennamarie Goings is a 47 y.o. female.  Patient is a 47 year old female with a history of an STEMI, hypertension, obesity, diabetes, eczema who is presenting today with 1 month of worsening eczema now diffusely over her body.  She was seen last week at [facility] and was given hydrocortisone cream which she has been putting on her skin but states it is not getting any better.  She said they prescribed her a pill but it was going to cost $160 and she could not get it filled.  She describes her skin is itching and burning constantly.  It is on her ears, face, upper extremities, trunk and lower extremities.  She denies any fever.  She has been taking Benadryl and antihistamines but is not helping.  The history is provided by the patient.  Rash   This is a recurrent problem. There has been no fever. The pain is moderate. The pain has been constant since onset. Associated symptoms include itching, pain and weeping. She has tried antihistamines and anti-itch cream for the symptoms. The treatment provided no relief.    Past Medical History:  Diagnosis Date  . Anemia   . Diabetes mellitus without complication (HCC)   . Hypertension   . NSTEMI (non-ST elevated myocardial infarction) (HCC)   . UC (ulcerative colitis) (HCC)   . UTI (urinary tract infection)     Patient Active Problem List   Diagnosis Date Noted  . Personal history of noncompliance with medical treatment, presenting hazards to health 11/16/2016  . NSTEMI (non-ST elevated myocardial infarction) (HCC) 12/21/2015  . UTI (lower urinary tract infection) 12/21/2015  . Morbid obesity (HCC) 08/12/2015  . Diabetes mellitus without complication (HCC) 08/12/2015  . Essential hypertension 08/12/2015    Past Surgical History:  Procedure  Laterality Date  . CARDIAC CATHETERIZATION N/A 12/22/2015   Procedure: Left Heart Cath and Coronary Angiography;  Surgeon: Yates Decamp, MD;  Location: [facility] INVASIVE CV LAB;  Service: Cardiovascular;  Laterality: N/A;  . CESAREAN SECTION       OB History   None      Home Medications    Prior to Admission medications   Medication Sig Start Date End Date Taking? Authorizing Provider  amoxicillin-clavulanate (AUGMENTIN) 875-125 MG tablet Take 1 tablet by mouth every 12 (twelve) hours. 07/29/17   Isa Rankin, MD  aspirin EC 81 MG tablet Take 1 tablet (81 mg total) by mouth daily. 12/22/15   Ghimire, Werner Lean, MD  glucose blood test strip Test 4-5 times a week before eating and 3-4 times a week 2 hours after eating. Pt uses- one touch verio IQ meter 08/24/15   Henson, Vickie L, NP-C  losartan (COZAAR) 50 MG tablet Take 50 mg by mouth daily.    [provider]  metFORMIN (GLUCOPHAGE) 1000 MG tablet Take 1 tablet (1,000 mg total) by mouth 2 (two) times daily with a meal. 11/16/16   Henson, Vickie L, NP-C  ONETOUCH DELICA LANCETS FINE MISC TEST 4 TO 5 TMES A WEEK BEFORE EATING AND 3 TO 4 TIMES A WEEK, 2 HOURS AFTER EATING 08/24/15   Henson, Vickie L, NP-C  promethazine (PHENERGAN) 25 MG tablet Take 1 tablet (25 mg total) by mouth every 6 (six) hours as needed for nausea or vomiting.  03/25/17   Servando Salinaossi, Catherine H, NP    Family History Family History  Problem Relation Age of Onset  . Diabetes Mother   . Hyperlipidemia Mother   . Hypertension Mother   . Cancer Father     Social History Social History   Tobacco Use  . Smoking status: Never Smoker  . Smokeless tobacco: Never Used  Substance Use Topics  . Alcohol use: No    Alcohol/week: 0.0 standard drinks  . Drug use: No     Allergies   Latex; Peanut oil; Tomato; Amlodipine; Hydrochlorothiazide w-triamterene; Metoprolol; and Shellfish-derived products   Review of Systems Review of Systems  Skin: Positive for itching  and rash.  All other systems reviewed and are negative.    Physical Exam Updated Vital Signs BP (!) 178/111   Pulse (!) 111   Temp 98.3 F (36.8 C) (Oral)   Resp (!) 22   Ht 5\' 2"  (1.575 m)   Wt 106.6 kg   SpO2 99%   BMI 42.98 kg/m   Physical Exam  Constitutional: She is oriented to person, place, and time. She appears well-developed and well-nourished. No distress.  HENT:  Head: Normocephalic and atraumatic.  Mouth/Throat: Oropharynx is clear and moist.  Eyes: Pupils are equal, round, and reactive to light. Conjunctivae and EOM are normal.  Neck: Normal range of motion. Neck supple.  Cardiovascular: Regular rhythm and intact distal pulses. Tachycardia present.  No murmur heard. Pulmonary/Chest: Effort normal and breath sounds normal. No respiratory distress. She has no wheezes. She has no rales.  Abdominal: Soft. She exhibits no distension. There is no tenderness. There is no rebound and no guarding.  Musculoskeletal: Normal range of motion. She exhibits no edema or tenderness.  Neurological: She is alert and oriented to person, place, and time.  Skin: Skin is warm and dry. Rash noted. No erythema.  Thickened skin with excoriation consistent with dyshidrotic eczema diffusely over the body.  And involves the face, ears, neck, trunk, bilateral upper extremities, bilateral lower extremities, abdomen.  No erythema, drainage, fluctuance.  Skin is dry and flaking.  Patches are not congruent  Psychiatric: She has a normal mood and affect. Her behavior is normal.  Nursing note and vitals reviewed.    ED Treatments / Results  Labs (all labs ordered are listed, but only abnormal results are displayed) Labs Reviewed - No data to display  EKG None  Radiology No results found.  Procedures Procedures (including critical care time)  Medications Ordered in ED Medications  methylPREDNISolone sodium succinate (SOLU-MEDROL) 125 mg/2 mL injection 125 mg (has no administration in  time range)  triamcinolone 0.1 % cream : eucerin cream, 1:1 (has no administration in time range)     Initial Impression / Assessment and Plan / ED Course  I have reviewed the triage vital signs and the nursing notes.  Pertinent labs & imaging results that were available during my care of the patient were reviewed by me and considered in my medical decision making (see chart for details).     Patient presenting today with diffuse dyshidrotic eczema involving most of her body.  She is scratching constantly but does not appear to have signs of secondary bacterial infection.  Lower suspicion for fungal infection at this time.  Patient has been trying to use topical creams but it is not helping.  Patient was given an IM dose of steroids and also started on prednisone.  Also she was given triamcinolone cream with Eucerin.  Patient given information  to follow-up with her regular doctor here in town as she does not have insurance at this time. Repeat VS are improved.  BS 110.    Final Clinical Impressions(s) / ED Diagnoses   Final diagnoses:  Dyshidrotic eczema    ED Discharge Orders         Ordered    predniSONE (DELTASONE) 20 MG tablet  Daily     05/03/18 1538           Gwyneth Sprout, MD 05/03/18 1538

## 2018-05-03 NOTE — ED Notes (Signed)
IV removed.

## 2018-05-03 NOTE — ED Triage Notes (Signed)
Pt presents to ED with hx of eczema.  Pt reports worsening symptoms including itching and burning pain.  Pt denies fever.

## 2018-05-03 NOTE — Progress Notes (Signed)
CSW provided pt with [facility] This clinic is accepting new patients. Pt understands that she needs to call Monday morning to schedule follow up appointment. Pt agreeable to doing so.   Montine CircleKelsy Markevion Lattin, Silverio LayLCSWA  Emergency Room  [phone]

## 2018-05-07 ENCOUNTER — Ambulatory Visit: Payer: Self-pay | Admitting: Family Medicine

## 2018-05-13 ENCOUNTER — Encounter: Payer: Self-pay | Admitting: Family Medicine

## 2018-05-14 ENCOUNTER — Ambulatory Visit (INDEPENDENT_AMBULATORY_CARE_PROVIDER_SITE_OTHER): Payer: Self-pay | Admitting: Family Medicine

## 2018-05-14 ENCOUNTER — Encounter: Payer: Self-pay | Admitting: Family Medicine

## 2018-05-14 VITALS — BP 155/81 | HR 88 | Resp 17 | Ht 62.0 in | Wt 225.8 lb

## 2018-05-14 DIAGNOSIS — Z7689 Persons encountering health services in other specified circumstances: Secondary | ICD-10-CM

## 2018-05-14 DIAGNOSIS — L309 Dermatitis, unspecified: Secondary | ICD-10-CM

## 2018-05-14 DIAGNOSIS — E119 Type 2 diabetes mellitus without complications: Secondary | ICD-10-CM

## 2018-05-14 DIAGNOSIS — I10 Essential (primary) hypertension: Secondary | ICD-10-CM

## 2018-05-14 DIAGNOSIS — Z6841 Body Mass Index (BMI) 40.0 and over, adult: Secondary | ICD-10-CM

## 2018-05-14 DIAGNOSIS — I214 Non-ST elevation (NSTEMI) myocardial infarction: Secondary | ICD-10-CM

## 2018-05-14 MED ORDER — SENNOSIDES-DOCUSATE SODIUM 8.6-50 MG PO TABS: 1.0000 | ORAL_TABLET | Freq: Every day | ORAL | 1 refills | Status: DC

## 2018-05-14 MED ORDER — LOSARTAN POTASSIUM 50 MG PO TABS: 50.0000 mg | ORAL_TABLET | Freq: Every day | ORAL | 1 refills | Status: DC

## 2018-05-14 MED ORDER — METFORMIN HCL 1000 MG PO TABS: 1000.0000 mg | ORAL_TABLET | Freq: Two times a day (BID) | ORAL | 1 refills | Status: DC

## 2018-05-14 NOTE — Progress Notes (Signed)
New Patient Office Visit  Subjective:  Patient ID: Courtney Stewart, female    DOB: September 08, 1970  Age: 47 y.o. MRN: [id number]  CC:  Chief Complaint  Patient presents with  . Establish Care  . Follow-up    ED 11/22: dyshidrotic eczema. has noticed major improvement since starting the Prednisone    HPI Courtney Stewart presents for medication refills. She suffers from uncontrolled hypertension. She also suffers from type 2 diabetes and has been without metformin.  She reports that her last A1c was approximately 7.5  6 months ago.  She was recently treated for dyshidrotic eczema with a prednisone taper which she reports she has missed a few doses however skin irritation has improved.  She has been previously followed by dermatology in the past but no recent follow-up as she is uninsured.  She also has a history of an NSTEMI which she reports was associated with giving herself a coffee enema while also consuming large volumes of oral intake of coffee.  She has had no cardiac follow-up since that time.  In review of hospital discharge summary she was discharged back to primary care with no cardiology follow-up warranted.  NSTEMI occurred in 2017 she was also recommended to take aspirin along with atorvastatin.  She has not taken either.  Current risk factors for CAD include uncontrolled hypertension, morbid obesity, diabetes, and family history. Current Body mass index is 41.3 kg/m.   Past Medical History:  Diagnosis Date  . Anemia   . Diabetes mellitus without complication (HCC)   . Hypertension   . NSTEMI (non-ST elevated myocardial infarction) (HCC)   . UC (ulcerative colitis) [facility](HCC)     Past Surgical History:  Procedure Laterality Date  . CARDIAC CATHETERIZATION N/A 12/22/2015   Procedure: Left Heart Cath and Coronary Angiography;  Surgeon: Yates DecampJay Ganji, MD;  [facility];  Service: Cardiovascular;  Laterality: N/A;  . CESAREAN SECTION      Family History  Problem Relation  Age of Onset  . Diabetes Mother   . Hyperlipidemia Mother   . Hypertension Mother   . Kidney disease Mother   . Colon cancer Father     Social History   Socioeconomic History  . Marital status: Single    Spouse name: Not on file  . Number of children: Not on file  . Years of education: Not on file  . Highest education level: Not on file  Occupational History  . Not on file  Social Needs  . Financial resource strain: Not on file  . Food insecurity:    Worry: Not on file    Inability: Not on file  . Transportation needs:    Medical: Not on file    Non-medical: Not on file  Tobacco Use  . Smoking status: Never Smoker  . Smokeless tobacco: Never Used  Substance and Sexual Activity  . Alcohol use: No    Alcohol/week: 0.0 standard drinks  . Drug use: No  . Sexual activity: Not on file  Lifestyle  . Physical activity:    Days per week: Not on file    Minutes per session: Not on file  . Stress: Not on file  Relationships  . Social connections:    Talks on phone: Not on file    Gets together: Not on file    Attends religious service: Not on file    Active member of club or organization: Not on file    Attends meetings of clubs or organizations: Not on file  Relationship status: Not on file  . Intimate partner violence:    Fear of current or ex partner: Not on file    Emotionally abused: Not on file    Physically abused: Not on file    Forced sexual activity: Not on file  Other Topics Concern  . Not on file  Social History Narrative  . Not on file    ROS Review of Systems Denies chest pain, headaches, new weakness, worsening cough, abdominal pain, edema, urinary retention, urinary frequency, wheezing,chest tightness,depression, suicidal ideations or auditory hallucinations. Objective:   Today's Vitals: BP (!) 155/81   Pulse 88   Resp 17   Ht 5\' 2"  (1.575 m)   Wt 225 lb 12.8 oz (102.4 kg)   SpO2 96%   BMI 41.30 kg/m   Physical Exam Constitutional:  Patient appears well-developed and well-nourished. No distress. HENT: Normocephalic, atraumatic, External right and left ear normal. Oropharynx is clear and moist.  Eyes: Conjunctivae and EOM are normal. PERRLA, no scleral icterus. Neck: Normal ROM. Neck supple. No JVD. No tracheal deviation. No thyromegaly. CVS: RRR, S1/S2 +, no murmurs, no gallops, no carotid bruit.  Pulmonary: Effort and breath sounds normal, no stridor, rhonchi, wheezes, rales.  Abdominal: Soft. BS +, no distension, tenderness, rebound or guarding.  Musculoskeletal: Normal range of motion. No edema and no tenderness.  Lymphadenopathy: No lymphadenopathy noted, cervical, inguinal or axillary Neuro: Alert. Normal reflexes, muscle tone coordination. No cranial nerve deficit. Skin: Skin is warm and dry. No rash noted. Not diaphoretic. No erythema. No pallor. Psychiatric: Normal mood and affect. Behavior, judgment, thought content normal. Assessment & Plan:   Problem List Items Addressed This Visit    None    Visit Diagnoses    Encounter to establish care    -  Primary      Outpatient Encounter Medications as of 05/14/2018  Medication Sig  . glucose blood test strip Test 4-5 times a week before eating and 3-4 times a week 2 hours after eating. Pt uses- one touch verio IQ meter  . ONETOUCH DELICA LANCETS FINE MISC TEST 4 TO 5 TMES A WEEK BEFORE EATING AND 3 TO 4 TIMES A WEEK, 2 HOURS AFTER EATING  . predniSONE (DELTASONE) 20 MG tablet Take 2 tablets (40 mg total) by mouth daily. For 5 days and then take 1 tab (20mg ) for 5 days and then take 1/2 tab (10mg ) for 4 days  . losartan (COZAAR) 50 MG tablet Take 50 mg by mouth daily.  . metFORMIN (GLUCOPHAGE) 1000 MG tablet Take 1 tablet (1,000 mg total) by mouth 2 (two) times daily with a meal. (Patient not taking: Reported on 05/14/2018)  . [DISCONTINUED] amoxicillin-clavulanate (AUGMENTIN) 875-125 MG tablet Take 1 tablet by mouth every 12 (twelve) hours.  . [DISCONTINUED]  aspirin EC 81 MG tablet Take 1 tablet (81 mg total) by mouth daily.  . [DISCONTINUED] hydrocortisone 2.5 % ointment APP TOPICALLY AA ON YOUR FACE BID  . [DISCONTINUED] promethazine (PHENERGAN) 25 MG tablet Take 1 tablet (25 mg total) by mouth every 6 (six) hours as needed for nausea or vomiting.   No facility-administered encounter medications on file as of 05/14/2018.    1. Encounter to establish care 2. Diabetes mellitus without complication (HCC) Last known A1C 7.5. Will repeat A1C. For now continue, metformin 1000 mg twice daily. Discouraged patient from reducing dose as A1C is not at goal. Aim for 30 minutes of exercise most days, with a goal of 150 minutes per week. -Glucose monitoring  at least a few times weekly. -increase foods containing whole grains (one-half of grain intake). -saturated fat intake should be reduced -reduce intake of trans fat (lowers LDL cholesterol and increases HDL cholesterol) -Eat 4-5 small meals during the day to reduce the risk of becoming hungry.   3. NSTEMI (non-ST elevated myocardial infarction) (HCC), currently not taking previously recommended statin therapy. Will check a fasting lipid panel at lab visit 1-2 weeks. If elevated, will recommend resuming astorvastina   4. Morbid obesity (HCC), Body mass index is 41.3 kg/m. ncouraged efforts to reduce weight include engaging in physical activity as tolerated with goal of 150 minutes per week. Improve dietary choices and eat a meal regimen consistent with a Mediterranean or DASH diet. Reduce simple carbohydrates. Do not skip meals and eat healthy snacks throughout the day to avoid over-eating at dinner. Set a goal weight loss that is achievable for you.  5. Essential hypertension, uncontrolled today. Resume losartan. We have discussed target BP range and blood pressure goal. I have advised patient to check BP regularly and to call us back or report to clinic if the numbers are consistently higher than 140/90.  We discussed the importance of compliance with medical therapy and DASH diet recommended, consequences of uncontrolled hypertension discussed.  Return in 1-2 weeks BP check and fasting labs.    6. Constipation, discouraged against frequent use of enema and or laxatives.  Recommended nightly stool softeners in combination with increasing mobility, water intake and natural fiber intake.  Orders Placed This Encounter  Procedures  . CBC with Differential    Standing Status:   Future    Standing Expiration Date:   05/15/2019  . Thyroid Panel With TSH    Standing Status:   Future    Standing Expiration Date:   05/15/2019  . Sedimentation Rate    Standing Status:   Future    Standing Expiration Date:   05/15/2019  . Comprehensive metabolic panel    Standing Status:   Future    Standing Expiration Date:   05/15/2019    Order Specific Question:   Has the patient fasted?    Answer:   No  . Hemoglobin A1c    Standing Status:   Future    Standing Expiration Date:   05/15/2019  . Lipid panel    Standing Status:   Future    Standing Expiration Date:   05/15/2019    Order Specific Question:   Has the patient fasted?    Answer:   No  . Vitamin D, 25-hydroxy    Standing Status:   Future    Standing Expiration Date:   05/15/2019    Meds ordered this encounter  Medications  . metFORMIN (GLUCOPHAGE) 1000 MG tablet    Sig: Take 1 tablet (1,000 mg total) by mouth 2 (two) times daily with a meal.    Dispense:  180 tablet    Refill:  1  . losartan (COZAAR) 50 MG tablet    Sig: Take 1 tablet (50 mg total) by mouth daily.    Dispense:  90 tablet    Refill:  1  . senna-docusate (SENOKOT-S) 8.6-50 MG tablet    Sig: Take 1-2 tablets by mouth at bedtime.    Dispense:  60 tablet    Refill:  1     Follow-up: Return for 1-2 week lab and BP check and 2 months with provider .    Joaquin Courts, FNP Primary Care at [facility] [address] [place]  [place] [address] [phone]:  [phone]

## 2018-05-14 NOTE — Patient Instructions (Addendum)
[facility]    Courtney Stewart was seen by Joaquin CourtsKimberly Harris, FNP today.   Lashunda Brucks's primary care provider is Bing NeighborsHarris, Kimberly S, FNP.   For the best care possible, you should try to see Joaquin CourtsKimberly Harris, FNP-C whenever you come to the clinic.   We look forward to seeing you again soon!  If you have any questions about your visit today, please call us at [phone] or feel free to reach your primary care provider via MyChart.      Constipation, Adult Constipation is when a person:  Poops (has a bowel movement) fewer times in a week than normal.  Has a hard time pooping.  Has poop that is dry, hard, or bigger than normal.  Follow these instructions at home: Eating and drinking   Eat foods that have a lot of fiber, such as: ? Fresh fruits and vegetables. ? Whole grains. ? Beans.  Eat less of foods that are high in fat, low in fiber, or overly processed, such as: ? [place] fries. ? Hamburgers. ? Cookies. ? Candy. ? Soda.  Drink enough fluid to keep your pee (urine) clear or pale yellow. General instructions  Exercise regularly or as told by your doctor.  Go to the restroom when you feel like you need to poop. Do not hold it in.  Take over-the-counter and prescription medicines only as told by your doctor. These include any fiber supplements.  Do pelvic floor retraining exercises, such as: ? Doing deep breathing while relaxing your lower belly (abdomen). ? Relaxing your pelvic floor while pooping.  Watch your condition for any changes.  Keep all follow-up visits as told by your doctor. This is important. Contact a doctor if:  You have pain that gets worse.  You have a fever.  You have not pooped for 4 days.  You throw up (vomit).  You are not hungry.  You lose weight.  You are bleeding from the anus.  You have thin, pencil-like poop (stool). Get help right away if:  You have a  fever, and your symptoms suddenly get worse.  You leak poop or have blood in your poop.  Your belly feels hard or bigger than normal (is bloated).  You have very bad belly pain.  You feel dizzy or you faint. This information is not intended to replace advice given to you by your health care provider. Make sure you discuss any questions you have with your health care provider. Document Released: 11/15/2007 Document Revised: 12/17/2015 Document Reviewed: 11/17/2015 Elsevier Interactive Patient Education  2018 [employer]  Diabetes Mellitus and Exercise Exercising regularly is important for your overall health, especially when you have diabetes (diabetes mellitus). Exercising is not only about losing weight. It has many health benefits, such as increasing muscle strength and bone density and reducing body fat and stress. This leads to improved fitness, flexibility, and endurance, all of which result in better overall health. Exercise has additional benefits for people with diabetes, including:  Reducing appetite.  Helping to lower and control blood glucose.  Lowering blood pressure.  Helping to control amounts of fatty substances (lipids) in the blood, such as cholesterol and triglycerides.  Helping the body to respond better to insulin (improving insulin sensitivity).  Reducing how much insulin the body needs.  Decreasing the risk for heart disease by: ? Lowering cholesterol and triglyceride levels. ? Increasing the levels of good cholesterol. ? Lowering blood glucose levels.  What is my activity plan? Your health care provider or certified diabetes educator can help you make a plan for the type and frequency of exercise (activity plan) that works for you. Make sure that you:  Do at least 150 minutes of moderate-intensity or vigorous-intensity exercise each week. This could be brisk walking, biking, or water aerobics. ? Do stretching and strength exercises, such as yoga or  weightlifting, at least 2 times a week. ? Spread out your activity over at least 3 days of the week.  Get some form of physical activity every day. ? Do not go more than 2 days in a row without some kind of physical activity. ? Avoid being inactive for more than 90 minutes at a time. Take frequent breaks to walk or stretch.  Choose a type of exercise or activity that you enjoy, and set realistic goals.  Start slowly, and gradually increase the intensity of your exercise over time.  What do I need to know about managing my diabetes?  Check your blood glucose before and after exercising. ? If your blood glucose is higher than 240 mg/dL (63.0 mmol/L) before you exercise, check your urine for ketones. If you have ketones in your urine, do not exercise until your blood glucose returns to normal.  Know the symptoms of low blood glucose (hypoglycemia) and how to treat it. Your risk for hypoglycemia increases during and after exercise. Common symptoms of hypoglycemia can include: ? Hunger. ? Anxiety. ? Sweating and feeling clammy. ? Confusion. ? Dizziness or feeling light-headed. ? Increased heart rate or palpitations. ? Blurry vision. ? Tingling or numbness around the mouth, lips, or tongue. ? Tremors or shakes. ? Irritability.  Keep a rapid-acting carbohydrate snack available before, during, and after exercise to help prevent or treat hypoglycemia.  Avoid injecting insulin into areas of the body that are going to be exercised. For example, avoid injecting insulin into: ? The arms, when playing tennis. ? The legs, when jogging.  Keep records of your exercise habits. Doing this can help you and your health care provider adjust your diabetes management plan as needed. Write down: ? Food that you eat before and after you exercise. ? Blood glucose levels before and after you exercise. ? The type and amount of exercise you have done. ? When your insulin is expected to peak, if you use  insulin. Avoid exercising at times when your insulin is peaking.  When you start a new exercise or activity, work with your health care provider to make sure the activity is safe for you, and to adjust your insulin, medicines, or food intake as needed.  Drink plenty of water while you exercise to prevent dehydration or heat stroke. Drink enough fluid to keep your urine clear or pale yellow. This information is not intended to replace advice given to you by your health care provider. Make sure you discuss any questions you have with your health care provider. Document Released: 08/19/2003 Document Revised: 12/17/2015 Document Reviewed: 11/08/2015 Elsevier Interactive Patient Education  2018 [employer].

## 2018-05-28 ENCOUNTER — Ambulatory Visit: Payer: Self-pay | Admitting: Family Medicine

## 2018-06-07 ENCOUNTER — Emergency Department (HOSPITAL_COMMUNITY): Payer: No Typology Code available for payment source

## 2018-06-07 ENCOUNTER — Encounter (HOSPITAL_COMMUNITY): Payer: Self-pay | Admitting: Obstetrics and Gynecology

## 2018-06-07 ENCOUNTER — Other Ambulatory Visit: Payer: Self-pay

## 2018-06-07 ENCOUNTER — Emergency Department (HOSPITAL_COMMUNITY)
Admission: EM | Admit: 2018-06-07 | Discharge: 2018-06-07 | Disposition: A | Discharge status: Home / Self Care | Payer: No Typology Code available for payment source | Attending: Emergency Medicine | Admitting: Emergency Medicine

## 2018-06-07 DIAGNOSIS — Z7984 Long term (current) use of oral hypoglycemic drugs: Secondary | ICD-10-CM | POA: Diagnosis not present

## 2018-06-07 DIAGNOSIS — Y9389 Activity, other specified: Secondary | ICD-10-CM | POA: Diagnosis not present

## 2018-06-07 DIAGNOSIS — S199XXA Unspecified injury of neck, initial encounter: Secondary | ICD-10-CM | POA: Diagnosis present

## 2018-06-07 DIAGNOSIS — Z79899 Other long term (current) drug therapy: Secondary | ICD-10-CM | POA: Insufficient documentation

## 2018-06-07 DIAGNOSIS — S161XXA Strain of muscle, fascia and tendon at neck level, initial encounter: Secondary | ICD-10-CM | POA: Diagnosis not present

## 2018-06-07 DIAGNOSIS — Z91040 Latex allergy status: Secondary | ICD-10-CM | POA: Insufficient documentation

## 2018-06-07 DIAGNOSIS — Z91010 Allergy to peanuts: Secondary | ICD-10-CM | POA: Diagnosis not present

## 2018-06-07 DIAGNOSIS — Y999 Unspecified external cause status: Secondary | ICD-10-CM | POA: Diagnosis not present

## 2018-06-07 DIAGNOSIS — Y92410 Unspecified street and highway as the place of occurrence of the external cause: Secondary | ICD-10-CM | POA: Diagnosis not present

## 2018-06-07 DIAGNOSIS — E119 Type 2 diabetes mellitus without complications: Secondary | ICD-10-CM | POA: Insufficient documentation

## 2018-06-07 DIAGNOSIS — M25562 Pain in left knee: Secondary | ICD-10-CM | POA: Diagnosis not present

## 2018-06-07 DIAGNOSIS — R42 Dizziness and giddiness: Secondary | ICD-10-CM | POA: Diagnosis not present

## 2018-06-07 DIAGNOSIS — R202 Paresthesia of skin: Secondary | ICD-10-CM | POA: Insufficient documentation

## 2018-06-07 DIAGNOSIS — I10 Essential (primary) hypertension: Secondary | ICD-10-CM | POA: Diagnosis not present

## 2018-06-07 MED ORDER — KETOROLAC TROMETHAMINE 60 MG/2ML IM SOLN
60.0000 mg | Freq: Once | INTRAMUSCULAR | Status: AC
  Administered 2018-06-07: 60 mg via INTRAMUSCULAR
  Filled 2018-06-07: qty 2

## 2018-06-07 MED ORDER — TRAMADOL HCL 50 MG PO TABS
100.0000 mg | ORAL_TABLET | Freq: Once | ORAL | Status: AC
  Administered 2018-06-07: 100 mg via ORAL
  Filled 2018-06-07: qty 2

## 2018-06-07 MED ORDER — OXYCODONE-ACETAMINOPHEN 5-325 MG PO TABS
1.0000 | ORAL_TABLET | Freq: Once | ORAL | Status: DC
  Filled 2018-06-07: qty 1

## 2018-06-07 MED ORDER — IBUPROFEN 600 MG PO TABS: 600.0000 mg | ORAL_TABLET | Freq: Three times a day (TID) | ORAL | 0 refills | Status: DC | PRN

## 2018-06-07 MED ORDER — DIAZEPAM 5 MG PO TABS
5.0000 mg | ORAL_TABLET | Freq: Once | ORAL | Status: AC
  Administered 2018-06-07: 5 mg via ORAL
  Filled 2018-06-07: qty 1

## 2018-06-07 MED ORDER — METHOCARBAMOL 500 MG PO TABS: 500.0000 mg | ORAL_TABLET | Freq: Three times a day (TID) | ORAL | 0 refills | Status: DC | PRN

## 2018-06-07 NOTE — ED Notes (Addendum)
Pt ambulated with no assistance. Pt will not allow RN and NT to touch her or help her. Pt stated that she would not step any further from her bed because she was in pain. Pt ambulated back to bed.

## 2018-06-07 NOTE — ED Notes (Signed)
Upon arrival pt needed to use restroom. Daughter at bedside started to record conversation and was asked to please turn phone off.
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
## 2018-06-07 NOTE — ED Notes (Signed)
Pt was taken into room and placed on bed pan, pericare done/

## 2018-06-07 NOTE — ED Notes (Signed)
Pt family attempted to get pt out of bed to ambulate. Pt became dizzy, and reports that she cant  feel her legs. Pt family member instructed to not attempted to get pt out of bed without staff assistance for patient safety. Pt family member states that's she is not happy with service.

## 2018-06-07 NOTE — ED Notes (Addendum)
Pt is made aware that she is up for discharge and became yelling and cursing stating that she has not had a PET scan and that she is in pain. Dr. Joselyn Arrowampo's made aware, and ordered tramadol, pt states that she only wants to take 1 pill. Pt encouraged to take second pill to help relieve the pain.

## 2018-06-07 NOTE — ED Notes (Addendum)
Pt is alert and oriented x 4 and is verbally responsive. Pt upset that she is in a hallway bed. Pt made aw are that no rooms were available at this time.

## 2018-06-07 NOTE — ED Notes (Signed)
Pt was able to stand and ambulate. Pt refused to allow staff to touch  her for safety and assist her with ambulation. Pt took 4 step and stated that she would not walk anymore, as she is in pain.

## 2018-06-07 NOTE — ED Notes (Signed)
Patient reports she "cannot feel" things the same in her left side. RN touched patient's legs bilaterally and she reported she could feel the sensation. Patient is claiming she is in shock. Pt alert and oriented at this time. Patient cursing at RN and stating "Move this shit off my ear" referring to the c-collar.
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
## 2018-06-07 NOTE — ED Notes (Signed)
Pt ambulated with this Clinical research associatewriter and Dr Patria Maneampos down the hall with no difficulty.  Daughter witnessed.  Pt was able to sit up on her own and swing her legs over the side of the bed without difficulty. Pt stood in hallway talking to the doctor for approx 5 minutes. He showed her how to bend over and stretch her back, and advised pt she would be sore tomorrow. He advised to not go home and sit still and to gently move tonight, he suggested she take some tylenol or ibuprofen at home since pt sts cant afford pain meds. Also suggested to pt to take a warm bath in the am for stiffness.  Pt was taken out in wheelchair by Walt DisneyCeleste RN. Pt sts she is ready to go.

## 2018-06-07 NOTE — ED Notes (Signed)
Bed: WTR5 Expected date:  Expected time:  Means of arrival:  Comments: 

## 2018-06-10 ENCOUNTER — Ambulatory Visit (HOSPITAL_COMMUNITY)
Admission: EM | Admit: 2018-06-10 | Discharge: 2018-06-10 | Disposition: A | Discharge status: Home / Self Care | Payer: Self-pay | Attending: Family Medicine | Admitting: Family Medicine

## 2018-06-10 ENCOUNTER — Encounter (HOSPITAL_COMMUNITY): Payer: Self-pay

## 2018-06-10 DIAGNOSIS — G44209 Tension-type headache, unspecified, not intractable: Secondary | ICD-10-CM

## 2018-06-10 DIAGNOSIS — M542 Cervicalgia: Secondary | ICD-10-CM

## 2018-06-10 DIAGNOSIS — R200 Anesthesia of skin: Secondary | ICD-10-CM

## 2018-06-10 DIAGNOSIS — M545 Low back pain, unspecified: Secondary | ICD-10-CM

## 2018-06-10 MED ORDER — CYCLOBENZAPRINE HCL 10 MG PO TABS: 10.0000 mg | ORAL_TABLET | Freq: Two times a day (BID) | ORAL | 0 refills | Status: DC | PRN

## 2018-06-10 NOTE — Discharge Instructions (Signed)
Continue the ibuprofen 3 times a day with food Take the flexeril as a muscle relaxer Rest Use ice or heat to neck and back Go back to ER if you continue to have significant numbness into the arm If you improve - can wait until you see your PCP
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
## 2018-06-11 ENCOUNTER — Emergency Department (HOSPITAL_COMMUNITY): Payer: No Typology Code available for payment source

## 2018-06-11 ENCOUNTER — Emergency Department (HOSPITAL_COMMUNITY)
Admission: EM | Admit: 2018-06-11 | Discharge: 2018-06-11 | Disposition: A | Discharge status: Home / Self Care | Payer: No Typology Code available for payment source | Attending: Emergency Medicine | Admitting: Emergency Medicine

## 2018-06-11 ENCOUNTER — Encounter (HOSPITAL_COMMUNITY): Payer: Self-pay | Admitting: *Deleted

## 2018-06-11 DIAGNOSIS — I252 Old myocardial infarction: Secondary | ICD-10-CM | POA: Insufficient documentation

## 2018-06-11 DIAGNOSIS — Z79899 Other long term (current) drug therapy: Secondary | ICD-10-CM | POA: Insufficient documentation

## 2018-06-11 DIAGNOSIS — Z91010 Allergy to peanuts: Secondary | ICD-10-CM | POA: Insufficient documentation

## 2018-06-11 DIAGNOSIS — M542 Cervicalgia: Secondary | ICD-10-CM | POA: Diagnosis not present

## 2018-06-11 DIAGNOSIS — I10 Essential (primary) hypertension: Secondary | ICD-10-CM | POA: Diagnosis not present

## 2018-06-11 DIAGNOSIS — E119 Type 2 diabetes mellitus without complications: Secondary | ICD-10-CM | POA: Diagnosis not present

## 2018-06-11 DIAGNOSIS — Z91040 Latex allergy status: Secondary | ICD-10-CM | POA: Insufficient documentation

## 2018-06-11 NOTE — ED Notes (Signed)
Pt verbalized understanding of discharge instructions and denies any further questions at this time.   
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
## 2018-06-11 NOTE — Discharge Instructions (Signed)
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
## 2018-06-24 ENCOUNTER — Encounter: Payer: Self-pay | Admitting: Family Medicine

## 2018-06-24 ENCOUNTER — Ambulatory Visit (INDEPENDENT_AMBULATORY_CARE_PROVIDER_SITE_OTHER): Payer: Self-pay | Admitting: Family Medicine

## 2018-06-24 VITALS — BP 157/93 | HR 82 | Resp 17 | Ht 62.0 in | Wt 222.6 lb

## 2018-06-24 DIAGNOSIS — E119 Type 2 diabetes mellitus without complications: Secondary | ICD-10-CM

## 2018-06-24 DIAGNOSIS — R531 Weakness: Secondary | ICD-10-CM

## 2018-06-24 DIAGNOSIS — L301 Dyshidrosis [pompholyx]: Secondary | ICD-10-CM

## 2018-06-24 DIAGNOSIS — R51 Headache: Secondary | ICD-10-CM

## 2018-06-24 DIAGNOSIS — I10 Essential (primary) hypertension: Secondary | ICD-10-CM

## 2018-06-24 DIAGNOSIS — M436 Torticollis: Secondary | ICD-10-CM

## 2018-06-24 DIAGNOSIS — L309 Dermatitis, unspecified: Secondary | ICD-10-CM

## 2018-06-24 DIAGNOSIS — R519 Headache, unspecified: Secondary | ICD-10-CM

## 2018-06-24 DIAGNOSIS — Z6841 Body Mass Index (BMI) 40.0 and over, adult: Secondary | ICD-10-CM

## 2018-06-24 LAB — GLUCOSE, POCT (MANUAL RESULT ENTRY): POC GLUCOSE: 97 mg/dL (ref 70–99)

## 2018-06-24 MED ORDER — BETAMETHASONE DIPROPIONATE 0.05 % EX CREA: TOPICAL_CREAM | Freq: Two times a day (BID) | CUTANEOUS | 2 refills | Status: DC

## 2018-06-24 MED ORDER — METFORMIN HCL 1000 MG PO TABS: 1000.0000 mg | ORAL_TABLET | Freq: Two times a day (BID) | ORAL | 1 refills | Status: DC
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
## 2018-06-25 LAB — CBC WITH DIFFERENTIAL/PLATELET
Basophils Absolute: 0.1 10*3/uL (ref 0.0–0.2)
Basos: 1 %
EOS (ABSOLUTE): 0.2 10*3/uL (ref 0.0–0.4)
Eos: 1 %
Hematocrit: 38.1 % (ref 34.0–46.6)
Hemoglobin: 12.3 g/dL (ref 11.1–15.9)
Immature Grans (Abs): 0.1 10*3/uL (ref 0.0–0.1)
Immature Granulocytes: 1 %
LYMPHS ABS: 3 10*3/uL (ref 0.7–3.1)
Lymphs: 28 %
MCH: 27 pg (ref 26.6–33.0)
MCHC: 32.3 g/dL (ref 31.5–35.7)
MCV: 84 fL (ref 79–97)
MONOCYTES: 7 %
Monocytes Absolute: 0.8 10*3/uL (ref 0.1–0.9)
Neutrophils Absolute: 6.8 10*3/uL (ref 1.4–7.0)
Neutrophils: 62 %
Platelets: 507 10*3/uL — ABNORMAL HIGH (ref 150–450)
RBC: 4.55 x10E6/uL (ref 3.77–5.28)
RDW: 13.9 % (ref 11.7–15.4)
WBC: 10.9 10*3/uL — ABNORMAL HIGH (ref 3.4–10.8)

## 2018-06-25 LAB — SEDIMENTATION RATE: SED RATE: 57 mm/h — AB (ref 0–32)

## 2018-06-25 LAB — COMPREHENSIVE METABOLIC PANEL
A/G RATIO: 1.4 (ref 1.2–2.2)
ALT: 15 IU/L (ref 0–32)
AST: 16 IU/L (ref 0–40)
Albumin: 4.6 g/dL (ref 3.5–5.5)
Alkaline Phosphatase: 77 IU/L (ref 39–117)
BILIRUBIN TOTAL: 0.2 mg/dL (ref 0.0–1.2)
BUN/Creatinine Ratio: 10 (ref 9–23)
BUN: 10 mg/dL (ref 6–24)
CHLORIDE: 103 mmol/L (ref 96–106)
CO2: 22 mmol/L (ref 20–29)
Calcium: 9.5 mg/dL (ref 8.7–10.2)
Creatinine, Ser: 1.05 mg/dL — ABNORMAL HIGH (ref 0.57–1.00)
GFR calc Af Amer: 74 mL/min/{1.73_m2} (ref >59)
GFR calc non Af Amer: 64 mL/min/{1.73_m2} (ref >59)
Globulin, Total: 3.3 g/dL (ref 1.5–4.5)
Glucose: 95 mg/dL (ref 65–99)
POTASSIUM: 4 mmol/L (ref 3.5–5.2)
Sodium: 140 mmol/L (ref 134–144)
Total Protein: 7.9 g/dL (ref 6.0–8.5)

## 2018-06-25 LAB — LIPID PANEL
CHOL/HDL RATIO: 4.9 ratio — AB (ref 0.0–4.4)
Cholesterol, Total: 186 mg/dL (ref 100–199)
HDL: 38 mg/dL — ABNORMAL LOW (ref >39)
LDL Calculated: 119 mg/dL — ABNORMAL HIGH (ref 0–99)
Triglycerides: 147 mg/dL (ref 0–149)
VLDL Cholesterol Cal: 29 mg/dL (ref 5–40)

## 2018-06-25 LAB — THYROID PANEL WITH TSH
Free Thyroxine Index: 2.2 (ref 1.2–4.9)
T3 Uptake Ratio: 24 % (ref 24–39)
T4, Total: 9.3 ug/dL (ref 4.5–12.0)
TSH: 2.01 u[IU]/mL (ref 0.450–4.500)

## 2018-06-25 LAB — MICROALBUMIN / CREATININE URINE RATIO
CREATININE, UR: 230 mg/dL
Microalb/Creat Ratio: 11.5 mg/g creat (ref 0.0–30.0)
Microalbumin, Urine: 26.5 ug/mL

## 2018-06-25 LAB — VITAMIN D 25 HYDROXY (VIT D DEFICIENCY, FRACTURES): Vit D, 25-Hydroxy: 10.6 ng/mL — ABNORMAL LOW (ref 30.0–100.0)

## 2018-06-25 LAB — HEMOGLOBIN A1C
Est. average glucose Bld gHb Est-mCnc: 154 mg/dL
Hgb A1c MFr Bld: 7 % — ABNORMAL HIGH (ref 4.8–5.6)

## 2018-06-28 ENCOUNTER — Other Ambulatory Visit: Payer: Self-pay | Admitting: Family Medicine

## 2018-06-28 MED ORDER — VITAMIN D (ERGOCALCIFEROL) 1.25 MG (50000 UNIT) PO CAPS: 50000.0000 [IU] | ORAL_CAPSULE | ORAL | 0 refills | Status: DC

## 2018-06-28 MED FILL — VIT D2 1.25 MG (50,000 UNIT: 1.25 MG | 84 days supply | Qty: 12 | Fill #0

## 2018-06-28 NOTE — Telephone Encounter (Signed)
Made follow up appointment for 09/27/2018 @ 8:30 for DM/BP f/up and to recheck vitamin d levels.

## 2018-06-28 NOTE — Telephone Encounter (Signed)
Patient notified of lab results & recommendations. Expressed understanding. Is agreeable to starting Vitamin D supplement. Would like it sent to [facility]. Doesn't want to start statin. States that she hasn't been eating the best & would like to correct her diet.

## 2018-06-28 NOTE — Telephone Encounter (Signed)
Contact patient to advise of following Vitamin d level is low, recommend starting ergocalciferol 50,000 units once weekly x 12 weeks A1C 7.0 indicating controlled diabetes-continue medication ad prescribed Cholesterol panel continues indicating elevated LDL (bad cholesterol) recommend statin therapy with atorvastatin 10 mg once daily . I would like to see her back in 3 months , fasting.

## 2018-08-14 ENCOUNTER — Ambulatory Visit: Payer: No Typology Code available for payment source | Admitting: Diagnostic Neuroimaging

## 2018-09-14 ENCOUNTER — Telehealth: Payer: No Typology Code available for payment source | Admitting: Physician Assistant

## 2018-09-14 DIAGNOSIS — R300 Dysuria: Secondary | ICD-10-CM

## 2018-09-14 MED ORDER — CEPHALEXIN 500 MG PO CAPS: 500.0000 mg | ORAL_CAPSULE | Freq: Two times a day (BID) | ORAL | 0 refills | Status: AC

## 2018-09-14 NOTE — Progress Notes (Signed)
Message sent to patient requesting further information regarding her back and belly pain -- need to assess location and severity to r/o need for f43f for more complicated UTI.   Awaiting patient response.

## 2018-09-14 NOTE — Progress Notes (Signed)

## 2018-09-14 NOTE — Progress Notes (Signed)
I have spent 5 minutes in review of e-visit questionnaire, review and updating patient chart, medical decision making and response to patient.   Jennessa Trigo Cody Adlene Adduci, PA-C    

## 2018-09-27 ENCOUNTER — Ambulatory Visit: Payer: Self-pay | Admitting: Family Medicine

## 2018-10-02 ENCOUNTER — Other Ambulatory Visit: Payer: Self-pay

## 2018-10-02 ENCOUNTER — Emergency Department (HOSPITAL_COMMUNITY): Payer: Self-pay

## 2018-10-02 ENCOUNTER — Encounter: Payer: Self-pay | Admitting: Family Medicine

## 2018-10-02 ENCOUNTER — Emergency Department (HOSPITAL_BASED_OUTPATIENT_CLINIC_OR_DEPARTMENT_OTHER): Payer: Self-pay

## 2018-10-02 ENCOUNTER — Encounter (HOSPITAL_COMMUNITY): Payer: Self-pay

## 2018-10-02 ENCOUNTER — Ambulatory Visit (INDEPENDENT_AMBULATORY_CARE_PROVIDER_SITE_OTHER): Payer: Self-pay | Admitting: Family Medicine

## 2018-10-02 ENCOUNTER — Emergency Department (HOSPITAL_COMMUNITY)
Admission: EM | Admit: 2018-10-02 | Discharge: 2018-10-02 | Disposition: A | Discharge status: Home / Self Care | Payer: Self-pay | Attending: Emergency Medicine | Admitting: Emergency Medicine

## 2018-10-02 DIAGNOSIS — R0602 Shortness of breath: Secondary | ICD-10-CM

## 2018-10-02 DIAGNOSIS — I10 Essential (primary) hypertension: Secondary | ICD-10-CM | POA: Insufficient documentation

## 2018-10-02 DIAGNOSIS — R1013 Epigastric pain: Secondary | ICD-10-CM

## 2018-10-02 DIAGNOSIS — K529 Noninfective gastroenteritis and colitis, unspecified: Secondary | ICD-10-CM | POA: Insufficient documentation

## 2018-10-02 DIAGNOSIS — J90 Pleural effusion, not elsewhere classified: Secondary | ICD-10-CM | POA: Insufficient documentation

## 2018-10-02 DIAGNOSIS — E119 Type 2 diabetes mellitus without complications: Secondary | ICD-10-CM | POA: Insufficient documentation

## 2018-10-02 DIAGNOSIS — Z7984 Long term (current) use of oral hypoglycemic drugs: Secondary | ICD-10-CM | POA: Insufficient documentation

## 2018-10-02 DIAGNOSIS — Z91040 Latex allergy status: Secondary | ICD-10-CM | POA: Insufficient documentation

## 2018-10-02 DIAGNOSIS — Z79899 Other long term (current) drug therapy: Secondary | ICD-10-CM | POA: Insufficient documentation

## 2018-10-02 DIAGNOSIS — R600 Localized edema: Secondary | ICD-10-CM

## 2018-10-02 DIAGNOSIS — I252 Old myocardial infarction: Secondary | ICD-10-CM | POA: Insufficient documentation

## 2018-10-02 DIAGNOSIS — M7989 Other specified soft tissue disorders: Secondary | ICD-10-CM

## 2018-10-02 LAB — COMPREHENSIVE METABOLIC PANEL
ALT: 50 U/L — ABNORMAL HIGH (ref 0–44)
AST: 35 U/L (ref 15–41)
Albumin: 3.8 g/dL (ref 3.5–5.0)
Alkaline Phosphatase: 60 U/L (ref 38–126)
Anion gap: 10 (ref 5–15)
BUN: 14 mg/dL (ref 6–20)
CO2: 20 mmol/L — ABNORMAL LOW (ref 22–32)
Calcium: 9.1 mg/dL (ref 8.9–10.3)
Chloride: 109 mmol/L (ref 98–111)
Creatinine, Ser: 1.06 mg/dL — ABNORMAL HIGH (ref 0.44–1.00)
GFR calc Af Amer: >60 mL/min (ref >60)
GFR calc non Af Amer: >60 mL/min (ref >60)
Glucose, Bld: 103 mg/dL — ABNORMAL HIGH (ref 70–99)
Potassium: 4.1 mmol/L (ref 3.5–5.1)
Sodium: 139 mmol/L (ref 135–145)
Total Bilirubin: 0.7 mg/dL (ref 0.3–1.2)
Total Protein: 7.7 g/dL (ref 6.5–8.1)

## 2018-10-02 LAB — CBC
HCT: 39.1 % (ref 36.0–46.0)
Hemoglobin: 11.9 g/dL — ABNORMAL LOW (ref 12.0–15.0)
MCH: 26.7 pg (ref 26.0–34.0)
MCHC: 30.4 g/dL (ref 30.0–36.0)
MCV: 87.7 fL (ref 80.0–100.0)
Platelets: 372 10*3/uL (ref 150–400)
RBC: 4.46 MIL/uL (ref 3.87–5.11)
RDW: 15.1 % (ref 11.5–15.5)
WBC: 7 10*3/uL (ref 4.0–10.5)
nRBC: 0 % (ref 0.0–0.2)

## 2018-10-02 LAB — I-STAT BETA HCG BLOOD, ED (MC, WL, AP ONLY): I-stat hCG, quantitative: <5 m[IU]/mL (ref <5)

## 2018-10-02 LAB — URINALYSIS, ROUTINE W REFLEX MICROSCOPIC
Bilirubin Urine: NEGATIVE
Glucose, UA: NEGATIVE mg/dL
Hgb urine dipstick: NEGATIVE
Ketones, ur: 5 mg/dL — AB
Leukocytes,Ua: NEGATIVE
Nitrite: NEGATIVE
Protein, ur: 100 mg/dL — AB
Specific Gravity, Urine: 1.027 (ref 1.005–1.030)
pH: 5 (ref 5.0–8.0)

## 2018-10-02 LAB — I-STAT TROPONIN, ED: Troponin i, poc: 0.01 ng/mL (ref 0.00–0.08)

## 2018-10-02 LAB — LIPASE, BLOOD: Lipase: 27 U/L (ref 11–51)

## 2018-10-02 LAB — D-DIMER, QUANTITATIVE: D-Dimer, Quant: 3.04 ug/mL-FEU — ABNORMAL HIGH (ref 0.00–0.50)

## 2018-10-02 LAB — TROPONIN I: Troponin I: <0.03 ng/mL (ref <0.03)

## 2018-10-02 MED ORDER — SODIUM CHLORIDE 0.9% FLUSH: 3.0000 mL | Freq: Once | INTRAVENOUS | Status: DC

## 2018-10-02 MED ORDER — IOHEXOL 350 MG/ML SOLN
100.0000 mL | Freq: Once | INTRAVENOUS | Status: AC | PRN
  Administered 2018-10-02: 19:00:00 100 mL via INTRAVENOUS

## 2018-10-02 MED ORDER — AMOXICILLIN-POT CLAVULANATE 875-125 MG PO TABS: 1.0000 | ORAL_TABLET | Freq: Two times a day (BID) | ORAL | 0 refills | Status: DC

## 2018-10-02 MED ORDER — ACETAMINOPHEN 500 MG PO TABS
1000.0000 mg | ORAL_TABLET | Freq: Once | ORAL | Status: AC
  Administered 2018-10-02: 20:00:00 1000 mg via ORAL
  Filled 2018-10-02: qty 2

## 2018-10-02 MED ORDER — FENTANYL CITRATE (PF) 100 MCG/2ML IJ SOLN
50.0000 ug | Freq: Once | INTRAMUSCULAR | Status: DC
  Filled 2018-10-02: qty 2

## 2018-10-02 NOTE — Progress Notes (Signed)
.  Virtual Visit via Telephone Note  I connected with Courtney Stewart on 10/02/18 at  2:10 PM EDT by telephone and verified that I am speaking with the correct person using two identifiers.   I discussed the limitations, risks, security and privacy concerns of performing an evaluation and management service by telephone and the availability of in person appointments. I also discussed with the patient that there may be a patient responsible charge related to this service. The patient expressed understanding and agreed to proceed.  Provider located in primary care office.   History of Present Illness: Shortness of breath and Elevated Blood Pressure  Patient reports over the course of the last few days experiencing gradually worsening shortness of breath. Shortness of breath was so severe yesterday, she had to leave work. She has checked her blood pressure over the last few days and has not any readings less than 160/100. Highest reading she can recall was 169/112 and at present her blood pressure reading per her monitor [place] 163/105. She endorses palpitations. No dizziness or chest pain. She is also experiencing upper mid epigastric abdominal pain which has also been present for two days. Unable to associate whether or not chest pain is related to GI symptoms. She endorse 6 loose stools yesterday and has had several today. Attempted relief of GI symptoms with Tums without relief of symptoms. Endorses a poor appetite and able to tolerate only sips of water.   Assessment and Plan: 1. Diabetes mellitus without complication (HCC) Controlled per home readings. Next f/u repeat A1C 2. Shortness of breath 3. Edema of right lower extremity 4. Accelerated essential hypertension 5. Midepigastric pain Given persistently elevated blood pressure, unilateral edema, and new symptoms of SOB accompanied with GI symptoms, deferring patient to ER for further work-up and evaluation. She has a history of MI and continues  to have multiple co-morbid conditions and predisposes her complication related to cardiovascular disease.    Follow Up Instructions: Advised patient to go to the ER for further evaluation of acute onset shortness of breath and persistently elevated blood pressure.    I discussed the assessment and treatment plan with the patient. The patient was provided an opportunity to ask questions and all were answered. The patient agreed with the plan and demonstrated an understanding of the instructions.   The patient was advised to call back or seek an in-person evaluation if the symptoms worsen or if the condition fails to improve as anticipated.  I provided 15 minutes of non-face-to-face time during this encounter.   Joaquin Courts, FNP

## 2018-10-02 NOTE — ED Notes (Signed)
Patient transported to X-ray 

## 2018-10-02 NOTE — ED Provider Notes (Signed)
[facility] EMERGENCY DEPARTMENT Provider Note   CSN: [id number] Arrival date & time: 10/02/18  1503    History   Chief Complaint Chief Complaint  Patient presents with   Shortness of Breath   Abdominal Pain    HPI Courtney Stewart is a 48 y.o. female past medical history of diabetes, hypertension, and STEMI, ulcerative colitis who presents for evaluation of 2 days of progressive worsening shortness of breath, epigastric abdominal pain.  Patient reports about 2 days ago, she noted she was having some worsening dyspnea on exertion.  Patient states that when she sits down, she feels like she can breathe but states that it feels heavier than normal.  Additionally, she states when she gets up, she feels like she has worsening shortness of breath.  She states that this is abnormal for her.  She denies any chest pain or palpitations.  Patient states she is also had some epigastric abdominal pain that began 2 days ago.  She states that this feels consistent with an ulcerative colitis flare.  She does not currently follow with any GI doctor.  Additionally, patient reports that over the last month, she has had some pain and swelling in the right lower extremity.  Patient called her primary care doctor today who prompted him to go the emergency department.  Patient reports that a few days ago, she had 1 bowel movement that was slightly bright red but did not noted any blood or dark melena.  She has not had any further episodes since.  Patient states she has not noted any fevers, chest pain, numbness/weakness of her arms or legs, nausea/vomiting.  She has had little bit of diarrhea.     The history is provided by the patient.    Past Medical History:  Diagnosis Date   Anemia    Diabetes mellitus without complication (HCC)    Hypertension    NSTEMI (non-ST elevated myocardial infarction) (HCC)    UC (ulcerative colitis) [facility])     Patient Active Problem List   Diagnosis  Date Noted   Personal history of noncompliance with medical treatment, presenting hazards to health 11/16/2016   NSTEMI (non-ST elevated myocardial infarction) (HCC) 12/21/2015   Morbid obesity (HCC) 08/12/2015   Diabetes mellitus without complication (HCC) 08/12/2015   Essential hypertension 08/12/2015    Past Surgical History:  Procedure Laterality Date   CARDIAC CATHETERIZATION N/A 12/22/2015   Procedure: Left Heart Cath and Coronary Angiography;  Surgeon: Yates Decamp, MD;  Location[facility] INVASIVE CV LAB;  Service: Cardiovascular;  Laterality: N/A;   CESAREAN SECTION       OB History   No obstetric history on file.      Home Medications    Prior to Admission medications   Medication Sig Start Date End Date Taking? Authorizing Provider  amoxicillin-clavulanate (AUGMENTIN) 875-125 MG tablet Take 1 tablet by mouth every 12 (twelve) hours. 10/02/18   Graciella Freer A, PA-C  glucose blood test strip Test 4-5 times a week before eating and 3-4 times a week 2 hours after eating. Pt uses- one touch verio IQ meter 08/24/15   Henson, Vickie L, NP-C  ibuprofen (ADVIL,MOTRIN) 600 MG tablet Take 1 tablet (600 mg total) by mouth every 8 (eight) hours as needed. Patient not taking: Reported on 10/02/2018 06/07/18   Azalia Bilis, MD  losartan (COZAAR) 50 MG tablet Take 1 tablet (50 mg total) by mouth daily. 05/14/18   Bing Neighbors, FNP  metFORMIN (GLUCOPHAGE) 1000 MG tablet Take  1 tablet (1,000 mg total) by mouth 2 (two) times daily with a meal. 06/24/18   Harris, Godfrey Pick, FNP  ONETOUCH DELICA LANCETS FINE MISC TEST 4 TO 5 TMES A WEEK BEFORE EATING AND 3 TO 4 TIMES A WEEK, 2 HOURS AFTER EATING 08/24/15   Henson, Vickie L, NP-C  Vitamin D, Ergocalciferol, (DRISDOL) 1.25 MG (50000 UT) CAPS capsule Take 1 capsule (50,000 Units total) by mouth every 7 (seven) days. 06/28/18   Bing Neighbors, FNP    Family History Family History  Problem Relation Age of Onset   Diabetes Mother     Hyperlipidemia Mother    Hypertension Mother    Kidney disease Mother    Colon cancer Father     Social History Social History   Tobacco Use   Smoking status: Never Smoker   Smokeless tobacco: Never Used  Substance Use Topics   Alcohol use: No    Alcohol/week: 0.0 standard drinks   Drug use: No     Allergies   Latex; Peanut oil; Tomato; Amlodipine; Hydrochlorothiazide w-triamterene; Metoprolol; Oxycodone; and Shellfish-derived products   Review of Systems Review of Systems  Constitutional: Negative for fever.  HENT: Negative for congestion.   Respiratory: Positive for shortness of breath. Negative for cough.   Cardiovascular: Positive for leg swelling. Negative for chest pain.  Gastrointestinal: Positive for abdominal pain. Negative for diarrhea, nausea and vomiting.  Genitourinary: Negative for dysuria and hematuria.  Musculoskeletal: Negative for back pain and neck pain.  Skin: Negative for rash.  Neurological: Negative for dizziness, weakness, numbness and headaches.  All other systems reviewed and are negative.    Physical Exam Updated Vital Signs BP (!) 157/99 (BP Location: Right Arm)    Pulse 88    Temp 97.8 F (36.6 C) (Oral)    Resp 18    Ht  (1.575 m)    Wt 99.8 kg    LMP  (Approximate) Comment: Pt states she has not had a period in "almost 3 months"   SpO2 100%    BMI 40.24 kg/m   Physical Exam Vitals signs and nursing note reviewed.  Constitutional:      Appearance: Normal appearance. She is well-developed.  HENT:     Head: Normocephalic and atraumatic.  Eyes:     General: Lids are normal.     Conjunctiva/sclera: Conjunctivae normal.     Pupils: Pupils are equal, round, and reactive to light.  Neck:     Musculoskeletal: Full passive range of motion without pain.  Cardiovascular:     Rate and Rhythm: Normal rate and regular rhythm.     Pulses:          Radial pulses are 2+ on the right side and 2+ on the left side.       Dorsalis pedis  pulses are detected w/ Doppler on the right side and 2+ on the left side.     Heart sounds: Normal heart sounds. No murmur. No friction rub. No gallop.      Comments: Easily obtain left DP pulse.  Unable to palpate right DP pulse.  Easily obtainable with Doppler. Pulmonary:     Effort: Pulmonary effort is normal.     Breath sounds: Normal breath sounds.     Comments: Lungs clear to auscultation bilaterally.  Symmetric chest rise.  No wheezing, rales, rhonchi. Abdominal:     Palpations: Abdomen is soft. Abdomen is not rigid.     Tenderness: There is abdominal tenderness in the epigastric  area. There is no guarding.     Comments: Abdomen is soft, nondistended.  Tenderness noted to epigastric region.  Musculoskeletal: Normal range of motion.  Skin:    General: Skin is warm and dry.     Capillary Refill: Capillary refill takes less than 2 seconds.  Neurological:     Mental Status: She is alert and oriented to person, place, and time.     Comments: Follows commands, Moves all extremities  5/5 strength to BUE and BLE  Sensation intact throughout all major nerve distributions  Psychiatric:        Speech: Speech normal.      ED Treatments / Results  Labs (all labs ordered are listed, but only abnormal results are displayed) Labs Reviewed  COMPREHENSIVE METABOLIC PANEL - Abnormal; Notable for the following components:      Result Value   CO2 20 (*)    Glucose, Bld 103 (*)    Creatinine, Ser 1.06 (*)    ALT 50 (*)    All other components within normal limits  CBC - Abnormal; Notable for the following components:   Hemoglobin 11.9 (*)    All other components within normal limits  URINALYSIS, ROUTINE W REFLEX MICROSCOPIC - Abnormal; Notable for the following components:   APPearance HAZY (*)    Ketones, ur 5 (*)    Protein, ur 100 (*)    Bacteria, UA RARE (*)    All other components within normal limits  D-DIMER, QUANTITATIVE (NOT AT [facility]) - Abnormal; Notable for the following  components:   D-Dimer, Quant 3.04 (*)    All other components within normal limits  LIPASE, BLOOD  TROPONIN I  I-STAT BETA HCG BLOOD, ED ([facility], [facility], [facility] ONLY)  I-STAT TROPONIN, ED    EKG EKG Interpretation  Date/Time:  Wednesday October 02 2018 15:37:04 EDT Ventricular Rate:  89 PR Interval:  166 QRS Duration: 92 QT Interval:  384 QTC Calculation: 467 R Axis:   92 Text Interpretation:  Normal sinus rhythm Rightward axis Low voltage QRS Nonspecific T wave abnormality Prolonged QT Abnormal ECG Confirmed by Virgina Norfolk [phone]) on 10/02/2018 6:05:42 PM   Radiology Dg Chest 2 View  Result Date: 10/02/2018 CLINICAL DATA:  48 year old female with epigastric pain EXAM: CHEST - 2 VIEW COMPARISON:  06/11/2018, 12/20/2005 FINDINGS: Cardiomediastinal silhouette within normal limits in size and contour. No evidence of central vascular congestion. Low lung volumes. No pneumothorax or pleural effusion. No confluent airspace disease. Coarsened interstitial markings. No displaced fracture. IMPRESSION: Low lung volumes without evidence of acute cardiopulmonary disease. Electronically Signed   By: Gilmer Mor D.O.   On: 10/02/2018 17:34   Ct Angio Chest/abd/pel For Dissection W And/or Wo Contrast  Result Date: 10/02/2018 CLINICAL DATA:  Shortness of breath, epigastric discomfort. EXAM: CT ANGIOGRAPHY CHEST, ABDOMEN AND PELVIS TECHNIQUE: Multidetector CT imaging through the chest, abdomen and pelvis was performed using the standard protocol during bolus administration of intravenous contrast. Multiplanar reconstructed images and MIPs were obtained and reviewed to evaluate the vascular anatomy. CONTRAST:  OMNIPAQUE IOHEXOL 350 MG/ML SOLN COMPARISON:  None. FINDINGS: CTA CHEST FINDINGS Cardiovascular: Heart is mildly enlarged. Aorta is normal caliber. No dissection. No filling defects in the pulmonary arteries to suggest pulmonary emboli. Mediastinum/Nodes: Mildly prominent mediastinal lymph nodes. Right  paratracheal lymph node has a short axis diameter of 13 mm. No hilar adenopathy. Shotty axillary lymph nodes, the largest in the left axilla with a short axis diameter of 9 mm. Lungs/Pleura: Small bilateral effusions,  right slightly greater than left. No confluent airspace opacities. Musculoskeletal: Chest wall soft tissues are unremarkable. No acute bony abnormality. Review of the MIP images confirms the above findings. CTA ABDOMEN AND PELVIS FINDINGS VASCULAR Aorta: Normal caliber aorta without aneurysm, dissection, vasculitis or significant stenosis. Celiac: Patent without evidence of aneurysm, dissection, vasculitis or significant stenosis. SMA: Patent without evidence of aneurysm, dissection, vasculitis or significant stenosis. Renals: Both renal arteries are patent without evidence of aneurysm, dissection, vasculitis, fibromuscular dysplasia or significant stenosis. IMA: Patent without evidence of aneurysm, dissection, vasculitis or significant stenosis. Inflow: Patent without evidence of aneurysm, dissection, vasculitis or significant stenosis. Veins: No obvious venous abnormality within the limitations of this arterial phase study. Review of the MIP images confirms the above findings. NON-VASCULAR Hepatobiliary: No focal hepatic abnormality. Gallbladder unremarkable. Pancreas: No focal abnormality or ductal dilatation. Spleen: No focal abnormality.  Normal size. Adrenals/Urinary Tract: No adrenal abnormality. No focal renal abnormality. No stones or hydronephrosis. Urinary bladder is unremarkable. Stomach/Bowel: There is mild wall thickening involving the cecum and ascending colon with surrounding fluid in the right paracolic gutter concerning for colitis. No evidence of bowel obstruction. Stomach and small bowel grossly unremarkable. Lymphatic: Shotty retroperitoneal lymph nodes, none pathologically enlarged. Reproductive: Uterus and adnexa unremarkable.  No mass. Other: Small to moderate free fluid in the  pelvis. Small amount of free fluid in the right paracolic gutter. Musculoskeletal: No acute bony abnormality. Review of the MIP images confirms the above findings. IMPRESSION: No evidence of aortic aneurysm or dissection. Cardiomegaly. Small bilateral pleural effusions, right greater than left. No confluent airspace opacities. Wall thickening in the cecum and ascending colon with free fluid in the pelvis and right paracolic gutter. Findings concerning for infectious or inflammatory colitis. Electronically Signed   By: Charlett Nose M.D.   On: 10/02/2018 19:53   Vas [place] Lower Extremity Venous (dvt) (only Mc & Wl 7a-7p)  Result Date: 10/02/2018  Lower Venous Study Indications: Swelling, and SOB.  Performing Technologist: Jeb Levering RDMS, RVT  Examination Guidelines: A complete evaluation includes B-mode imaging, spectral Doppler, color Doppler, and power Doppler as needed of all accessible portions of each vessel. Bilateral testing is considered an integral part of a complete examination. Limited examinations for reoccurring indications may be performed as noted.  +---------+---------------+---------+-----------+----------+-------+  RIGHT     Compressibility Phasicity Spontaneity Properties Summary  +---------+---------------+---------+-----------+----------+-------+  CFV       Full            Yes       Yes                             +---------+---------------+---------+-----------+----------+-------+  SFJ       Full                                                      +---------+---------------+---------+-----------+----------+-------+  FV Prox   Full                                                      +---------+---------------+---------+-----------+----------+-------+  FV Mid    Full                                                      +---------+---------------+---------+-----------+----------+-------+  FV Distal Full                                                       +---------+---------------+---------+-----------+----------+-------+  PFV       Full                                                      +---------+---------------+---------+-----------+----------+-------+  POP       Full            Yes       Yes                             +---------+---------------+---------+-----------+----------+-------+  PTV       Full                                                      +---------+---------------+---------+-----------+----------+-------+  PERO      Full                                                      +---------+---------------+---------+-----------+----------+-------+     Summary: Right: There is no evidence of deep vein thrombosis in the lower extremity. No cystic structure found in the popliteal fossa.  *See table(s) above for measurements and observations.    Preliminary     Procedures Procedures (including critical care time)  Medications Ordered in ED Medications  sodium chloride flush (NS) 0.9 % injection 3 mL (3 mLs Intravenous Not Given 10/02/18 1629)  fentaNYL (SUBLIMAZE) injection 50 mcg (50 mcg Intravenous Not Given 10/02/18 1750)  iohexol (OMNIPAQUE) 350 MG/ML injection 100 mL (100 mLs Intravenous Contrast Given 10/02/18 1924)  acetaminophen (TYLENOL) tablet 1,000 mg (1,000 mg Oral Given 10/02/18 2013)     Initial Impression / Assessment and Plan / ED Course  I have reviewed the triage vital signs and the nursing notes.  Pertinent labs & imaging results that were available during my care of the patient were reviewed by me and considered in my medical decision making (see chart for details).        48 year old female who presents for evaluation of shortness of breath, abdominal pain x2 days.  History of ulcerative colitis and states abdominal pain feels similar to past ulcerative clinic flares.  Currently not followed by any GI.  Also reports progressive worsening shortness of breath as well as some right lower extremity edema that is been  ongoing for the last month.  Called her PCP who prompted her to come to emergency department.  Patient with history of NSTEMI but states she is not currently have any chest pain.  On initial ED arrival, she is afebrile, slightly tachycardic and hypertensive.  On exam, she has right lower extremity edema as well as some tenderness noted to the epigastric region.  Lungs clear  to auscultation.  Initially was unable to palpate DP pulse but was easily obtainable with Doppler.  Plan to check labs, EKG, chest x-ray.  Additionally, given concerns of right lower extremity edema as well as shortness of breath, will plan for d-dimer.  Initial troponin negative.  I-STAT beta negative.  BMP shows bicarb of 20, glucose of 103.  Otherwise unremarkable.  Lipase unremarkable.  CBC shows no evidence of leukocytosis.  Hemoglobin is 11.9.  Review of records show that she usually is around 12 but has been 11.9 previously in the past.  UA negative for any infectious etiology.  D-dimer is elevated.  Chest x-ray negative for any acute infectious etiology.  Given elevation in d-dimer, will plan for ultrasound of leg as well as CTA of chest given shortness of breath.  Given shortness breath, chest pain as well as right lower extremity findings, will plan for CTA abdomen pelvis also to evaluate for any aortic abnormality. Patient declined POC fecal occult.  U/S reviewed.  No evidence of right-sided DVT.  CTA shows no evidence of aortic aneurysm or dissection.  No mention of PEs.  There is small bilateral pleural effusions, right greater than left.  No confluent airspace opacities.  Additionally, there is wall thickening in the cecum and ascending colon with free fluid in the pelvis and right paracolic gutter.  Findings concerning for infectious or inflammatory colitis.   Discussed results with patient.  Reports some improvement in abdominal pain but notes it is still slightly irritated.  She declined any pain medication in ED.  Repeat  exam shows abdomen is soft.  No evidence of rigidity, guarding. No peritoneal signs.  Do not suspect perforation at this time.  She has not seen GI in several years.  She has no medication regimen for ulcerative colitis.  I discussed with patient that given lack of GI follow-up, do not feel that starting patient on ulcerative colitis regimen at this time is in her best interest.  We will plan to give her antibiotics to help with pain inflammation.  Additionally, will plan to give GI referral.  At this time, patient's O2 sats are 100% on room air.  She is still slightly hypertensive but has not taken her evening medication.  Patient states she feels comfortable going home.  Encourage GI follow-up. At this time, patient exhibits no emergent life-threatening condition that require further evaluation in ED or admission. Patient had ample opportunity for questions and discussion. All patient's questions were answered with full understanding. Strict return precautions discussed. Patient expresses understanding and agreement to plan.   Portions of this note were generated with Scientist, clinical (histocompatibility and immunogenetics). Dictation errors may occur despite best attempts at proofreading.  Final Clinical Impressions(s) / ED Diagnoses   Final diagnoses:  Colitis  Shortness of breath  Pleural effusion    ED Discharge Orders         Ordered    amoxicillin-clavulanate (AUGMENTIN) 875-125 MG tablet  Every 12 hours     10/02/18 2133           Maxwell Caul, PA-C 10/03/18 2042    Virgina Norfolk, DO 10/05/18 1525

## 2018-10-02 NOTE — ED Notes (Signed)
Passed Fluid challenge

## 2018-10-02 NOTE — ED Notes (Signed)
Patient transported to CT 

## 2018-10-02 NOTE — Progress Notes (Deleted)
Blood pressure reading today was 163/105 after taking medication  Blood sugars range in the 130's  H/o colitis- diarrhea and nausea x 2 days

## 2018-10-02 NOTE — Discharge Instructions (Addendum)
As we discussed, your CT scan did not show any evidence of blood clots in her lungs or infection.  There was a small amount of fluid in the lungs.  He will need to follow-up with your primary care doctor regarding this.  Your CT of your abdomen showed colitis which is likely a flare of your ulcerative colitis.  You need to follow-up with referred GI doctor for further evaluation.  I provided referral for you. Take antibiotics as directed. Please take all of your antibiotics until finished.  Return to The emergency department for any worsening shortness of breath, chest pain, blood in stools, any other worsening or concerning symptoms.

## 2018-10-02 NOTE — Progress Notes (Signed)
RLE venous duplex       has been completed. Preliminary results can be found under CV proc through chart review. Tayja Manzer, BS, RDMS, RVT   

## 2018-10-02 NOTE — ED Triage Notes (Signed)
Pt arrives POV for eval of SOBx2days w/ epigastric discomfort onset yesterday. Pt w/ hx of Crohn's disease and she states this feels similar to flare ups in the past. Endorses nausea and diarrhea, no vomiting. Denies chest pain.

## 2018-10-04 ENCOUNTER — Other Ambulatory Visit: Payer: Self-pay

## 2018-10-04 ENCOUNTER — Emergency Department (HOSPITAL_COMMUNITY): Payer: Self-pay

## 2018-10-04 ENCOUNTER — Emergency Department (HOSPITAL_COMMUNITY)
Admission: EM | Admit: 2018-10-04 | Discharge: 2018-10-04 | Disposition: A | Discharge status: Home / Self Care | Payer: Self-pay | Attending: Emergency Medicine | Admitting: Emergency Medicine

## 2018-10-04 ENCOUNTER — Encounter (HOSPITAL_COMMUNITY): Payer: Self-pay

## 2018-10-04 DIAGNOSIS — I10 Essential (primary) hypertension: Secondary | ICD-10-CM | POA: Insufficient documentation

## 2018-10-04 DIAGNOSIS — Z91040 Latex allergy status: Secondary | ICD-10-CM | POA: Insufficient documentation

## 2018-10-04 DIAGNOSIS — E119 Type 2 diabetes mellitus without complications: Secondary | ICD-10-CM | POA: Insufficient documentation

## 2018-10-04 DIAGNOSIS — R600 Localized edema: Secondary | ICD-10-CM

## 2018-10-04 DIAGNOSIS — Z7984 Long term (current) use of oral hypoglycemic drugs: Secondary | ICD-10-CM | POA: Insufficient documentation

## 2018-10-04 DIAGNOSIS — R0602 Shortness of breath: Secondary | ICD-10-CM | POA: Insufficient documentation

## 2018-10-04 DIAGNOSIS — R609 Edema, unspecified: Secondary | ICD-10-CM | POA: Insufficient documentation

## 2018-10-04 DIAGNOSIS — Z79899 Other long term (current) drug therapy: Secondary | ICD-10-CM | POA: Insufficient documentation

## 2018-10-04 DIAGNOSIS — I252 Old myocardial infarction: Secondary | ICD-10-CM | POA: Insufficient documentation

## 2018-10-04 DIAGNOSIS — R0682 Tachypnea, not elsewhere classified: Secondary | ICD-10-CM | POA: Insufficient documentation

## 2018-10-04 LAB — CBC WITH DIFFERENTIAL/PLATELET
Abs Immature Granulocytes: 0.04 10*3/uL (ref 0.00–0.07)
Basophils Absolute: 0.1 10*3/uL (ref 0.0–0.1)
Basophils Relative: 1 %
Eosinophils Absolute: 0.3 10*3/uL (ref 0.0–0.5)
Eosinophils Relative: 4 %
HCT: 37.4 % (ref 36.0–46.0)
Hemoglobin: 11.2 g/dL — ABNORMAL LOW (ref 12.0–15.0)
Immature Granulocytes: 1 %
Lymphocytes Relative: 39 %
Lymphs Abs: 2.8 10*3/uL (ref 0.7–4.0)
MCH: 26.7 pg (ref 26.0–34.0)
MCHC: 29.9 g/dL — ABNORMAL LOW (ref 30.0–36.0)
MCV: 89 fL (ref 80.0–100.0)
Monocytes Absolute: 0.7 10*3/uL (ref 0.1–1.0)
Monocytes Relative: 9 %
Neutro Abs: 3.4 10*3/uL (ref 1.7–7.7)
Neutrophils Relative %: 46 %
Platelets: 382 10*3/uL (ref 150–400)
RBC: 4.2 MIL/uL (ref 3.87–5.11)
RDW: 15.4 % (ref 11.5–15.5)
WBC: 7.2 10*3/uL (ref 4.0–10.5)
nRBC: 0 % (ref 0.0–0.2)

## 2018-10-04 LAB — URINALYSIS, ROUTINE W REFLEX MICROSCOPIC
Bilirubin Urine: NEGATIVE
Glucose, UA: NEGATIVE mg/dL
Hgb urine dipstick: NEGATIVE
Ketones, ur: NEGATIVE mg/dL
Leukocytes,Ua: NEGATIVE
Nitrite: NEGATIVE
Protein, ur: >=300 mg/dL — AB
Specific Gravity, Urine: 1.03 (ref 1.005–1.030)
pH: 5 (ref 5.0–8.0)

## 2018-10-04 LAB — TROPONIN I: Troponin I: <0.03 ng/mL (ref <0.03)

## 2018-10-04 LAB — COMPREHENSIVE METABOLIC PANEL
ALT: 48 U/L — ABNORMAL HIGH (ref 0–44)
AST: 28 U/L (ref 15–41)
Albumin: 3.6 g/dL (ref 3.5–5.0)
Alkaline Phosphatase: 55 U/L (ref 38–126)
Anion gap: 8 (ref 5–15)
BUN: 14 mg/dL (ref 6–20)
CO2: 22 mmol/L (ref 22–32)
Calcium: 9.1 mg/dL (ref 8.9–10.3)
Chloride: 109 mmol/L (ref 98–111)
Creatinine, Ser: 1.18 mg/dL — ABNORMAL HIGH (ref 0.44–1.00)
GFR calc Af Amer: >60 mL/min (ref >60)
GFR calc non Af Amer: 55 mL/min — ABNORMAL LOW (ref >60)
Glucose, Bld: 129 mg/dL — ABNORMAL HIGH (ref 70–99)
Potassium: 4.4 mmol/L (ref 3.5–5.1)
Sodium: 139 mmol/L (ref 135–145)
Total Bilirubin: 0.4 mg/dL (ref 0.3–1.2)
Total Protein: 7.2 g/dL (ref 6.5–8.1)

## 2018-10-04 LAB — BRAIN NATRIURETIC PEPTIDE: B Natriuretic Peptide: 933 pg/mL — ABNORMAL HIGH (ref 0.0–100.0)

## 2018-10-04 LAB — CBG MONITORING, ED: Glucose-Capillary: 117 mg/dL — ABNORMAL HIGH (ref 70–99)

## 2018-10-04 MED ORDER — FUROSEMIDE 20 MG PO TABS: 40.0000 mg | ORAL_TABLET | Freq: Every day | ORAL | 0 refills | Status: DC

## 2018-10-04 MED ORDER — MORPHINE SULFATE (PF) 4 MG/ML IV SOLN
4.0000 mg | Freq: Once | INTRAVENOUS | Status: AC
  Administered 2018-10-04: 4 mg via INTRAMUSCULAR
  Filled 2018-10-04: qty 1

## 2018-10-04 MED ORDER — MORPHINE SULFATE (PF) 4 MG/ML IV SOLN: 4.0000 mg | Freq: Once | INTRAVENOUS | Status: DC

## 2018-10-04 MED ORDER — FUROSEMIDE 20 MG PO TABS
80.0000 mg | ORAL_TABLET | Freq: Once | ORAL | Status: AC
  Administered 2018-10-04: 80 mg via ORAL
  Filled 2018-10-04: qty 4

## 2018-10-04 NOTE — ED Provider Notes (Signed)
[facility] EMERGENCY DEPARTMENT Provider Note   CSN: [id number] Arrival date & time: 10/04/18  0013    History   Chief Complaint Chief Complaint  Patient presents with  . Shortness of Breath    HPI Courtney Stewart is a 48 y.o. female with a hx of diabetes, hypertension, and NSTEMI, ulcerative colitis  presents to the Emergency Department complaining of gradual, persistent, progressively worsening SOB onset 3 days ago.  Pt reports she was seen yesterday for same, but her SOB has worsened. She reports she is ordinarily able to walk without difficulty, but has been unable to even walk to the bathroom in her home without difficulty.  Pt reports associated dry cough, HTN and generalized fatigue.  She reports taking morning medication as prescribed but has not taken any this evening.  Pt denies CP, weakness, dizziness, fever, dysuria.  Pt denies a hx of CHF.  She reports no known sick contacts, but does state that she is concerned she may have "the virus."  Pt reports mild epigastric abd pain from her colitis that is persistent since yesterday, but her major concern is the SOB.  Pt does report some orthopnea.  Records reviewed.  No Hx of CHF and is not followed by cardiology.  Pt had extensive work-up yesterday for abd pain and SOB.   At that time, trop was normal, d-dimer was significantly elevated but CT chest was without evidence of PNA or PE. LE Venous duplex was without DVT.  Pt was afebrile.  Abd CT did show colitis.  There was no evidence of DKA. Pt was d/c home with Augmentin.  Pt was hypertensive and tachycardic yesterday during ED visit.       The history is provided by the patient and medical records. No language interpreter was used.    Past Medical History:  Diagnosis Date  . Anemia   . Diabetes mellitus without complication (HCC)   . Hypertension   . NSTEMI (non-ST elevated myocardial infarction) (HCC)   . UC (ulcerative colitis) [facility])     Patient Active  Problem List   Diagnosis Date Noted  . Personal history of noncompliance with medical treatment, presenting hazards to health 11/16/2016  . NSTEMI (non-ST elevated myocardial infarction) (HCC) 12/21/2015  . Morbid obesity (HCC) 08/12/2015  . Diabetes mellitus without complication (HCC) 08/12/2015  . Essential hypertension 08/12/2015    Past Surgical History:  Procedure Laterality Date  . CARDIAC CATHETERIZATION N/A 12/22/2015   Procedure: Left Heart Cath and Coronary Angiography;  Surgeon: Yates Decamp, MD;  Location: [facility] INVASIVE CV LAB;  Service: Cardiovascular;  Laterality: N/A;  . CESAREAN SECTION       OB History   No obstetric history on file.      Home Medications    Prior to Admission medications   Medication Sig Start Date End Date Taking? Authorizing Provider  amoxicillin-clavulanate (AUGMENTIN) 875-125 MG tablet Take 1 tablet by mouth every 12 (twelve) hours. 10/02/18  Yes Maxwell Caul, PA-C  losartan (COZAAR) 50 MG tablet Take 1 tablet (50 mg total) by mouth daily. 05/14/18  Yes Bing Neighbors, FNP  metFORMIN (GLUCOPHAGE) 1000 MG tablet Take 1 tablet (1,000 mg total) by mouth 2 (two) times daily with a meal. 06/24/18  Yes Bing Neighbors, FNP  Vitamin D, Ergocalciferol, (DRISDOL) 1.25 MG (50000 UT) CAPS capsule Take 1 capsule (50,000 Units total) by mouth every 7 (seven) days. 06/28/18  Yes Bing Neighbors, FNP  furosemide (LASIX) 20 MG tablet Take  2 tablets (40 mg total) by mouth daily. For 3 days then decrease to 20mg  per day. 10/04/18   Jassen Sarver, Dahlia ClientHannah, PA-C  glucose blood test strip Test 4-5 times a week before eating and 3-4 times a week 2 hours after eating. Pt uses- one touch verio IQ meter 08/24/15   Henson, Vickie L, NP-C  ibuprofen (ADVIL,MOTRIN) 600 MG tablet Take 1 tablet (600 mg total) by mouth every 8 (eight) hours as needed. Patient not taking: Reported on 10/02/2018 06/07/18   Azalia Bilisampos, Kevin, MD  [facility] HospitalNETOUCH DELICA LANCETS FINE MISC TEST 4 TO 5 TMES A  WEEK BEFORE EATING AND 3 TO 4 TIMES A WEEK, 2 HOURS AFTER EATING 08/24/15   Avanell ShackletonHenson, Vickie L, NP-C    Family History Family History  Problem Relation Age of Onset  . Diabetes Mother   . Hyperlipidemia Mother   . Hypertension Mother   . Kidney disease Mother   . Colon cancer Father     Social History Social History   Tobacco Use  . Smoking status: Never Smoker  . Smokeless tobacco: Never Used  Substance Use Topics  . Alcohol use: No    Alcohol/week: 0.0 standard drinks  . Drug use: No     Allergies   Latex; Peanut oil; Tomato; Amlodipine; Hydrochlorothiazide w-triamterene; Metoprolol; Oxycodone; and Shellfish-derived products   Review of Systems Review of Systems  Constitutional: Positive for fatigue. Negative for appetite change, diaphoresis, fever and unexpected weight change.  HENT: Positive for congestion. Negative for mouth sores.   Eyes: Negative for visual disturbance.  Respiratory: Positive for cough ( dry) and shortness of breath. Negative for chest tightness and wheezing.   Cardiovascular: Negative for chest pain.  Gastrointestinal: Positive for abdominal pain ( epigastric). Negative for constipation, diarrhea, nausea and vomiting.  Endocrine: Negative for polydipsia, polyphagia and polyuria.  Genitourinary: Negative for dysuria, frequency, hematuria and urgency.  Musculoskeletal: Negative for back pain and neck stiffness.  Skin: Negative for rash.  Allergic/Immunologic: Negative for immunocompromised state.  Neurological: Negative for syncope, light-headedness and headaches.  Hematological: Does not bruise/bleed easily.  Psychiatric/Behavioral: Negative for sleep disturbance. The patient is not nervous/anxious.      Physical Exam Updated Vital Signs BP (!) 187/97   Pulse (!) 104   Temp (!) 97.5 F (36.4 C) (Oral)   Resp (!) 22   SpO2 98%   Physical Exam Vitals signs and nursing note reviewed.  Constitutional:      General: She is not in acute  distress.    Appearance: She is not diaphoretic.  HENT:     Head: Normocephalic.  Eyes:     General: No scleral icterus.    Conjunctiva/sclera: Conjunctivae normal.  Neck:     Musculoskeletal: Normal range of motion.     Vascular: No JVD.  Cardiovascular:     Rate and Rhythm: Normal rate and regular rhythm.     Pulses: Normal pulses.          Radial pulses are 2+ on the right side and 2+ on the left side.  Pulmonary:     Effort: Tachypnea present. No accessory muscle usage, prolonged expiration, respiratory distress or retractions.     Breath sounds: Decreased air movement present. No stridor.     Comments: Equal chest rise. Diminished lung sounds in the bases.  Mild increased work of breathing without accessory muscle usage or retractions.  Abdominal:     General: There is no distension.     Palpations: Abdomen is soft.  Tenderness: There is no abdominal tenderness. There is no guarding or rebound.  Musculoskeletal:     Right lower leg: Edema present.     Left lower leg: Edema present.     Comments: Moves all extremities equally and without difficulty. No calf tenderness  Skin:    General: Skin is warm and dry.     Capillary Refill: Capillary refill takes less than 2 seconds.  Neurological:     Mental Status: She is alert.     GCS: GCS eye subscore is 4. GCS verbal subscore is 5. GCS motor subscore is 6.     Comments: Speech is clear and goal oriented.  Psychiatric:        Mood and Affect: Mood normal.      ED Treatments / Results  Labs (all labs ordered are listed, but only abnormal results are displayed) Labs Reviewed  CBC WITH DIFFERENTIAL/PLATELET - Abnormal; Notable for the following components:      Result Value   Hemoglobin 11.2 (*)    MCHC 29.9 (*)    All other components within normal limits  COMPREHENSIVE METABOLIC PANEL - Abnormal; Notable for the following components:   Glucose, Bld 129 (*)    Creatinine, Ser 1.18 (*)    ALT 48 (*)    GFR calc  non Af Amer 55 (*)    All other components within normal limits  URINALYSIS, ROUTINE W REFLEX MICROSCOPIC - Abnormal; Notable for the following components:   APPearance HAZY (*)    Protein, ur >=300 (*)    Bacteria, UA FEW (*)    All other components within normal limits  BRAIN NATRIURETIC PEPTIDE - Abnormal; Notable for the following components:   B Natriuretic Peptide 933.0 (*)    All other components within normal limits  CBG MONITORING, ED - Abnormal; Notable for the following components:   Glucose-Capillary 117 (*)    All other components within normal limits  TROPONIN I    EKG EKG Interpretation  Date/Time:  Friday October 04 2018 00:23:39 EDT Ventricular Rate:  96 PR Interval:    QRS Duration: 98 QT Interval:  358 QTC Calculation: 453 R Axis:   85 Text Interpretation:  Sinus rhythm Borderline low voltage, extremity leads No acute changes Confirmed by Marily Memos [id number]) on 10/04/2018 6:06:48 AM    Radiology Dg Chest Port 1 View  Result Date: 10/04/2018 CLINICAL DATA:  Shortness of breath EXAM: PORTABLE CHEST 1 VIEW COMPARISON:  10/02/2018 FINDINGS: Cardiomegaly. Mild vascular congestion. Left basilar opacity, likely atelectasis. Suspect small bilateral effusions as seen on prior CT. No overt edema or acute bony abnormality. IMPRESSION: Cardiomegaly, vascular congestion. Possible small bilateral effusions as seen on recent CT. Left base atelectasis. Electronically Signed   By: Charlett Nose M.D.   On: 10/04/2018 01:32    Procedures Procedures (including critical care time)  Medications Ordered in ED Medications  morphine 4 MG/ML injection 4 mg (4 mg Intramuscular Given 10/04/18 0217)  furosemide (LASIX) tablet 80 mg (80 mg Oral Given 10/04/18 0220)     Initial Impression / Assessment and Plan / ED Course  I have reviewed the triage vital signs and the nursing notes.  Pertinent labs & imaging results that were available during my care of the patient were reviewed by  me and considered in my medical decision making (see chart for details).  Clinical Course as of Oct 04 606  Fri Oct 04, 2018  0059 Pt tachycardic on arrival, but not on my clinical exam  Pulse Rate(!): 104 [HM]  0059 HTN noted  BP(!): 187/97 [HM]  0059 Only HTN medication noted is Cozaar, QD.   [HM]  0149 Nurse tech reports pt ambulates without hypoxia or SOB   [HM]  0200 Elevated.  No previous.  B Natriuretic Peptide(!): 933.0 [HM]  0554 Pt reports she feels much better and wishes to be d/c home   [HM]  0558 BP improvement.  BP(!): 146/92 [HM]  0559 No persistent tachycardia  Pulse Rate: 84 [HM]    Clinical Course User Index [HM] Athalia Setterlund, Ngan, Courtney Stewart was evaluated in Emergency Department on 10/04/2018 for the symptoms described in the history of present illness. She was evaluated in the context of the global COVID-19 pandemic, which necessitated consideration that the patient might be at risk for infection with the SARS-CoV-2 virus that causes COVID-19. Institutional protocols and algorithms that pertain to the evaluation of patients at risk for COVID-19 are in a state of rapid change based on information released by regulatory bodies including the CDC and federal and state organizations. These policies and algorithms were followed during the patient's care in the ED.  Pt presents with SOB, worse with exertion.  Pt without fever or other signs of COIVD.  Pt with hx of NSTEMI, but no hx of CHF.  Large work-up 24 hours prior which was negative for ACS, PE, PNA.  Pt was dx with colitis which she reports is persistent , but is not the major concern today.  On exam pt with diminished lung sounds and pitting edema.  CXR today appears to have increased volume with mild vascular congestion, worse since yesterday.  I personally evaluated these images.  No evidence of PNA on CXR today.  Pt is hypertensive, but no evidence of hypertensive urgency.  Pt given lasix.  She  reports improvement and BP has improved. No evidence of ACS today.  No evidence of Sepsis.  EGC without ischemia.   She does not have a cardiologist.  Will d/c with lasix and have PCP f/u.  Cardiology contact info given.  Discussed home monitoring and reasons to return to the ED.  Pt states understanding and is in agreement with the plan.   The patient was discussed with and seen by Dr. Clayborne Dana who agrees with the treatment plan.   Final Clinical Impressions(s) / ED Diagnoses   Final diagnoses:  Shortness of breath  Fluid retention in legs    ED Discharge Orders         Ordered    furosemide (LASIX) 20 MG tablet  Daily     10/04/18 0556           Haden Suder, Dahlia Client, PA-C 10/04/18 1610    Mesner, Barbara Cower, MD 10/04/18 [phone]

## 2018-10-04 NOTE — ED Provider Notes (Signed)
Medical screening examination/treatment/procedure(s) were conducted as a shared visit with non-physician practitioner(s) and myself.  I personally evaluated the patient during the encounter.  Returns after visit yesterday with persistent shortness of breath.  Patient any fevers but does have a dry cough.  She specifically states that shortness of breath seems to be a lot worse when she lays flat and exert herself.  It was that she could go quite a ways without any problems but now she can barely walk from her bedroom to her bathroom without get shortness of breath.  She is not hypoxic here on ambulation.  Chest x-ray seems to be consistent with her CT scan yesterday which was negative for PE even with elevated d-dimer.  Chest had a CT then it showed colitis but did not receive any fluids it sounds like.  My examination patient has fine crackles and lower extremity edema.  No appreciable JVD or S3.  She is not hypoxic but is slightly tachypneic.  No respiratory distress. Suspect she likely has new onset heart failure with her elevated blood pressure, crackles, pleural effusions will add on a BNP and order some Lasix.  Plan to reevaluate and if improved symptom with Lasix can discharge on same for assistance with blood pressure control and also diuresis for the shortness of breath.  We will follow-up with PCP or cardiology (which ever her physician feels comfortable with).  Return for worsening symptoms.  EKG Interpretation  Date/Time:  Friday October 04 2018 00:23:39 EDT Ventricular Rate:  96 PR Interval:    QRS Duration: 98 QT Interval:  358 QTC Calculation: 453 R Axis:   85 Text Interpretation:  Sinus rhythm Borderline low voltage, extremity leads No acute changes Confirmed by Marily Memos [phone]) on 10/04/2018 6:06:48 AM      Courtney Stewart, Barbara Cower, MD 10/04/18 [phone]

## 2018-10-04 NOTE — Discharge Instructions (Addendum)
1. Medications: Lasix, usual home medications 2. Treatment: rest, be careful of large volume liquids and foods high in salt. 3. Follow Up: Please followup with your primary doctor in 2-3 days for discussion of your diagnoses and further evaluation after today's visit; if you do not have a primary care doctor use the resource guide provided to find one; Please return to the ER for new or worsening Shortness of breath, fevers, chills, or other concerns

## 2018-10-04 NOTE — ED Triage Notes (Signed)
Pt states that she has been SOB for 3 days, seen yesterday for the same, today having hypertension, reports compliance with medications. Denies fevers or cough

## 2018-10-28 ENCOUNTER — Emergency Department (HOSPITAL_COMMUNITY): Payer: Self-pay

## 2018-10-28 ENCOUNTER — Encounter (HOSPITAL_COMMUNITY): Payer: Self-pay | Admitting: Emergency Medicine

## 2018-10-28 ENCOUNTER — Emergency Department (HOSPITAL_COMMUNITY)
Admission: EM | Admit: 2018-10-28 | Discharge: 2018-10-28 | Disposition: A | Discharge status: Home / Self Care | Payer: Self-pay | Attending: Emergency Medicine | Admitting: Emergency Medicine

## 2018-10-28 DIAGNOSIS — R079 Chest pain, unspecified: Secondary | ICD-10-CM | POA: Insufficient documentation

## 2018-10-28 DIAGNOSIS — R0602 Shortness of breath: Secondary | ICD-10-CM | POA: Insufficient documentation

## 2018-10-28 DIAGNOSIS — Z79899 Other long term (current) drug therapy: Secondary | ICD-10-CM | POA: Insufficient documentation

## 2018-10-28 DIAGNOSIS — Z7984 Long term (current) use of oral hypoglycemic drugs: Secondary | ICD-10-CM | POA: Insufficient documentation

## 2018-10-28 DIAGNOSIS — Z91010 Allergy to peanuts: Secondary | ICD-10-CM | POA: Insufficient documentation

## 2018-10-28 DIAGNOSIS — R1013 Epigastric pain: Secondary | ICD-10-CM | POA: Insufficient documentation

## 2018-10-28 DIAGNOSIS — R110 Nausea: Secondary | ICD-10-CM | POA: Insufficient documentation

## 2018-10-28 DIAGNOSIS — I10 Essential (primary) hypertension: Secondary | ICD-10-CM | POA: Insufficient documentation

## 2018-10-28 DIAGNOSIS — E119 Type 2 diabetes mellitus without complications: Secondary | ICD-10-CM | POA: Insufficient documentation

## 2018-10-28 DIAGNOSIS — Z91040 Latex allergy status: Secondary | ICD-10-CM | POA: Insufficient documentation

## 2018-10-28 LAB — COMPREHENSIVE METABOLIC PANEL
ALT: 94 U/L — ABNORMAL HIGH (ref 0–44)
AST: 53 U/L — ABNORMAL HIGH (ref 15–41)
Albumin: 3.7 g/dL (ref 3.5–5.0)
Alkaline Phosphatase: 51 U/L (ref 38–126)
Anion gap: 13 (ref 5–15)
BUN: 22 mg/dL — ABNORMAL HIGH (ref 6–20)
CO2: 19 mmol/L — ABNORMAL LOW (ref 22–32)
Calcium: 9.2 mg/dL (ref 8.9–10.3)
Chloride: 106 mmol/L (ref 98–111)
Creatinine, Ser: 1.3 mg/dL — ABNORMAL HIGH (ref 0.44–1.00)
GFR calc Af Amer: 57 mL/min — ABNORMAL LOW (ref >60)
GFR calc non Af Amer: 49 mL/min — ABNORMAL LOW (ref >60)
Glucose, Bld: 90 mg/dL (ref 70–99)
Potassium: 4.5 mmol/L (ref 3.5–5.1)
Sodium: 138 mmol/L (ref 135–145)
Total Bilirubin: 1.1 mg/dL (ref 0.3–1.2)
Total Protein: 6.9 g/dL (ref 6.5–8.1)

## 2018-10-28 LAB — URINALYSIS, ROUTINE W REFLEX MICROSCOPIC
Bilirubin Urine: NEGATIVE
Glucose, UA: NEGATIVE mg/dL
Hgb urine dipstick: NEGATIVE
Ketones, ur: 5 mg/dL — AB
Leukocytes,Ua: NEGATIVE
Nitrite: NEGATIVE
Protein, ur: 100 mg/dL — AB
Specific Gravity, Urine: 1.026 (ref 1.005–1.030)
pH: 5 (ref 5.0–8.0)

## 2018-10-28 LAB — CBC WITH DIFFERENTIAL/PLATELET
Abs Immature Granulocytes: 0.03 10*3/uL (ref 0.00–0.07)
Basophils Absolute: 0.1 10*3/uL (ref 0.0–0.1)
Basophils Relative: 1 %
Eosinophils Absolute: 0.1 10*3/uL (ref 0.0–0.5)
Eosinophils Relative: 1 %
HCT: 38.8 % (ref 36.0–46.0)
Hemoglobin: 11.8 g/dL — ABNORMAL LOW (ref 12.0–15.0)
Immature Granulocytes: 0 %
Lymphocytes Relative: 38 %
Lymphs Abs: 2.8 10*3/uL (ref 0.7–4.0)
MCH: 26.3 pg (ref 26.0–34.0)
MCHC: 30.4 g/dL (ref 30.0–36.0)
MCV: 86.6 fL (ref 80.0–100.0)
Monocytes Absolute: 0.6 10*3/uL (ref 0.1–1.0)
Monocytes Relative: 9 %
Neutro Abs: 3.7 10*3/uL (ref 1.7–7.7)
Neutrophils Relative %: 51 %
Platelets: 424 10*3/uL — ABNORMAL HIGH (ref 150–400)
RBC: 4.48 MIL/uL (ref 3.87–5.11)
RDW: 14.8 % (ref 11.5–15.5)
WBC: 7.3 10*3/uL (ref 4.0–10.5)
nRBC: 0.3 % — ABNORMAL HIGH (ref 0.0–0.2)

## 2018-10-28 LAB — CBG MONITORING, ED: Glucose-Capillary: 94 mg/dL (ref 70–99)

## 2018-10-28 LAB — D-DIMER, QUANTITATIVE: D-Dimer, Quant: 1.3 ug/mL-FEU — ABNORMAL HIGH (ref 0.00–0.50)

## 2018-10-28 LAB — LIPASE, BLOOD: Lipase: 33 U/L (ref 11–51)

## 2018-10-28 LAB — TROPONIN I: Troponin I: <0.03 ng/mL (ref <0.03)

## 2018-10-28 MED ORDER — SODIUM CHLORIDE 0.9 % IV BOLUS
1000.0000 mL | Freq: Once | INTRAVENOUS | Status: AC
  Administered 2018-10-28: 14:00:00 1000 mL via INTRAVENOUS

## 2018-10-28 MED ORDER — IOHEXOL 350 MG/ML SOLN
100.0000 mL | Freq: Once | INTRAVENOUS | Status: AC | PRN
  Administered 2018-10-28: 15:00:00 80 mL via INTRAVENOUS

## 2018-10-28 MED ORDER — DROPERIDOL 2.5 MG/ML IJ SOLN
1.2500 mg | Freq: Once | INTRAMUSCULAR | Status: AC
  Administered 2018-10-28: 1.25 mg via INTRAVENOUS
  Filled 2018-10-28: qty 2

## 2018-10-28 MED ORDER — ONDANSETRON HCL 4 MG PO TABS: 4.0000 mg | ORAL_TABLET | Freq: Four times a day (QID) | ORAL | 0 refills | Status: DC | PRN

## 2018-10-28 MED ORDER — FUROSEMIDE 20 MG PO TABS: 20.0000 mg | ORAL_TABLET | Freq: Every day | ORAL | 0 refills | Status: DC

## 2018-10-28 NOTE — ED Triage Notes (Addendum)
Upper left abd pain and  Cp and sob when she walks x 3 weeks  Has had some vomiting  also

## 2018-10-28 NOTE — ED Notes (Signed)
Patient verbalizes understanding of discharge instructions. Opportunity for questioning and answers were provided. Armband removed by staff, pt discharged from ED.  

## 2018-10-28 NOTE — ED Provider Notes (Signed)
[facility] EMERGENCY DEPARTMENT Provider Note   CSN: [id number] Arrival date & time: 10/28/18  0930    History   Chief Complaint Chief Complaint  Patient presents with   Abdominal Pain   Shortness of Breath    HPI Courtney Stewart is a 48 y.o. female.  HPI   47yF with abdominal pain and dyspnea. Symptom onset about 3w ago. Waxes and wanes w/o appreciable exacerbating or relieving factors. Also feeling sob with exertion. Feels tight in chest but not painful. No cough. No fever or chills. No unusual leg pain or swelling. Nausea. No v/d. No urinary complaints.   Past Medical History:  Diagnosis Date   Anemia    Diabetes mellitus without complication (HCC)    Hypertension    NSTEMI (non-ST elevated myocardial infarction) (HCC)    UC (ulcerative colitis) [facility])     Patient Active Problem List   Diagnosis Date Noted   Personal history of noncompliance with medical treatment, presenting hazards to health 11/16/2016   NSTEMI (non-ST elevated myocardial infarction) (HCC) 12/21/2015   Morbid obesity (HCC) 08/12/2015   Diabetes mellitus without complication (HCC) 08/12/2015   Essential hypertension 08/12/2015    Past Surgical History:  Procedure Laterality Date   CARDIAC CATHETERIZATION N/A 12/22/2015   Procedure: Left Heart Cath and Coronary Angiography;  Surgeon: Yates Decamp, MD;  Location: [facility] INVASIVE CV LAB;  Service: Cardiovascular;  Laterality: N/A;   CESAREAN SECTION       OB History   No obstetric history on file.      Home Medications    Prior to Admission medications   Medication Sig Start Date End Date Taking? Authorizing Provider  amoxicillin-clavulanate (AUGMENTIN) 875-125 MG tablet Take 1 tablet by mouth every 12 (twelve) hours. 10/02/18   Maxwell Caul, PA-C  furosemide (LASIX) 20 MG tablet Take 2 tablets (40 mg total) by mouth daily. For 3 days then decrease to  per day. 10/04/18   Muthersbaugh, Dahlia Client, PA-C  glucose  blood test strip Test 4-5 times a week before eating and 3-4 times a week 2 hours after eating. Pt uses- one touch verio IQ meter 08/24/15   Henson, Vickie L, NP-C  ibuprofen (ADVIL,MOTRIN) 600 MG tablet Take 1 tablet (600 mg total) by mouth every 8 (eight) hours as needed. Patient not taking: Reported on 10/02/2018 06/07/18   Azalia Bilis, MD  losartan (COZAAR) 50 MG tablet Take 1 tablet (50 mg total) by mouth daily. 05/14/18   Bing Neighbors, FNP  metFORMIN (GLUCOPHAGE) 1000 MG tablet Take 1 tablet (1,000 mg total) by mouth 2 (two) times daily with a meal. 06/24/18   Harris, Godfrey Pick, FNP  ONETOUCH DELICA LANCETS FINE MISC TEST 4 TO 5 TMES A WEEK BEFORE EATING AND 3 TO 4 TIMES A WEEK, 2 HOURS AFTER EATING 08/24/15   Henson, Vickie L, NP-C  Vitamin D, Ergocalciferol, (DRISDOL) 1.25 MG (50000 UT) CAPS capsule Take 1 capsule (50,000 Units total) by mouth every 7 (seven) days. 06/28/18   Bing Neighbors, FNP    Family History Family History  Problem Relation Age of Onset   Diabetes Mother    Hyperlipidemia Mother    Hypertension Mother    Kidney disease Mother    Colon cancer Father     Social History Social History   Tobacco Use   Smoking status: Never Smoker   Smokeless tobacco: Never Used  Substance Use Topics   Alcohol use: No    Alcohol/week: 0.0 standard drinks  Drug use: No     Allergies   Latex; Peanut oil; Tomato; Amlodipine; Hydrochlorothiazide w-triamterene; Metoprolol; Oxycodone; and Shellfish-derived products   Review of Systems Review of Systems  All systems reviewed and negative, other than as noted in HPI.  Physical Exam Updated Vital Signs BP (!) 185/118 (BP Location: Right Arm)    Pulse 96    Temp 97.7 F (36.5 C) (Oral)    Resp 19    SpO2 100%   Physical Exam Vitals signs and nursing note reviewed.  Constitutional:      General: She is not in acute distress.    Appearance: She is well-developed.  HENT:     Head: Normocephalic and  atraumatic.  Eyes:     General:        Right eye: No discharge.        Left eye: No discharge.     Conjunctiva/sclera: Conjunctivae normal.  Neck:     Musculoskeletal: Neck supple.  Cardiovascular:     Rate and Rhythm: Normal rate and regular rhythm.     Heart sounds: Normal heart sounds. No murmur. No friction rub. No gallop.   Pulmonary:     Effort: Pulmonary effort is normal. No respiratory distress.     Breath sounds: Normal breath sounds.  Abdominal:     General: There is no distension.     Palpations: Abdomen is soft.     Tenderness: There is abdominal tenderness in the epigastric area. There is no guarding or rebound.  Musculoskeletal:        General: No tenderness.  Skin:    General: Skin is warm and dry.  Neurological:     Mental Status: She is alert.  Psychiatric:        Behavior: Behavior normal.        Thought Content: Thought content normal.      ED Treatments / Results  Labs (all labs ordered are listed, but only abnormal results are displayed) Labs Reviewed  COMPREHENSIVE METABOLIC PANEL - Abnormal; Notable for the following components:      Result Value   CO2 19 (*)    BUN 22 (*)    Creatinine, Ser 1.30 (*)    AST 53 (*)    ALT 94 (*)    GFR calc non Af Amer 49 (*)    GFR calc Af Amer 57 (*)    All other components within normal limits  CBC WITH DIFFERENTIAL/PLATELET - Abnormal; Notable for the following components:   Hemoglobin 11.8 (*)    Platelets 424 (*)    nRBC 0.3 (*)    All other components within normal limits  URINALYSIS, ROUTINE W REFLEX MICROSCOPIC - Abnormal; Notable for the following components:   Color, Urine AMBER (*)    APPearance HAZY (*)    Ketones, ur 5 (*)    Protein, ur 100 (*)    Bacteria, UA FEW (*)    All other components within normal limits  D-DIMER, QUANTITATIVE (NOT AT [facility]) - Abnormal; Notable for the following components:   D-Dimer, Quant 1.30 (*)    All other components within normal limits  LIPASE, BLOOD    TROPONIN I  CBG MONITORING, ED    EKG EKG Interpretation  Date/Time:  Monday Oct 28 2018 10:03:01 EDT Ventricular Rate:  99 PR Interval:    QRS Duration: 98 QT Interval:  328 QTC Calculation: 421 R Axis:   63 Text Interpretation:  Sinus rhythm Low voltage, extremity leads No significant change since  last tracing Confirmed by Raeford Razor [id number]) on 10/28/2018 10:20:33 AM   Radiology Dg Chest 2 View  Result Date: 10/28/2018 CLINICAL DATA:  Shortness of breath for 1 month. Nausea for 3 days. History of diabetes and hypertension. EXAM: CHEST - 2 VIEW COMPARISON:  Radiographs 10/04/2018.  CT 10/02/2018. FINDINGS: Interval improved inspiration with clearing of the lung bases. The heart is moderately enlarged but stable. There is no edema, pleural effusion or pneumothorax. Telemetry leads overlie the chest. No acute osseous findings are seen. IMPRESSION: Stable cardiomegaly.  No acute cardiopulmonary process. Electronically Signed   By: Carey Bullocks M.D.   On: 10/28/2018 12:13   Ct Angio Chest Pe W And/or Wo Contrast  Result Date: 10/28/2018 CLINICAL DATA:  Upper abdominal pain/chest pain x1 week. EXAM: CT ANGIOGRAPHY CHEST WITH CONTRAST TECHNIQUE: Multidetector CT imaging of the chest was performed using the standard protocol during bolus administration of intravenous contrast. Multiplanar CT image reconstructions and MIPs were obtained to evaluate the vascular anatomy. CONTRAST:  84mL OMNIPAQUE IOHEXOL 350 MG/ML SOLN COMPARISON:  CT dated 10/02/2018 FINDINGS: Cardiovascular: The heart size is enlarged. Evaluation for pulmonary emboli is limited by motion artifact. Given this limitation, no definite PE identified. The main pulmonary artery is dilated measuring 3.7 cm. There is reflux of contrast in the IVC. Mediastinum/Nodes: No enlarged mediastinal, hilar, or axillary lymph nodes. There is a right-sided thyroid nodule. Lungs/Pleura: There is a somewhat mosaic appearance of the lung  parenchyma, greatest within the left upper lobe. There is mild interlobular septal thickening. Atelectasis is noted in the lingula. There is a small right-sided pleural effusion. There is no discrete lung nodule, however evaluation is somewhat limited by respiratory motion artifact. Upper Abdomen: A small volume of abdominal ascites is noted in the upper abdomen. The liver parenchyma it is hypodense, likely representing hepatic steatosis. Musculoskeletal: No chest wall abnormality. No acute or significant osseous findings. Review of the MIP images confirms the above findings. IMPRESSION: 1. No PE identified, however evaluation was mildly limited by motion artifact. 2. Dilated main pulmonary artery which can be seen in patients with elevated PA pressures. 3. Cardiomegaly with findings of volume overload. There is reflux of contrast into the IVC consistent with underlying cardiac dysfunction 4. Small right-sided pleural effusion. 5. Small volume abdominal ascites noted in the upper abdomen. There is probable underlying hepatic steatosis. 6. Right-sided thyroid nodule. This can be further evaluated with an outpatient nonemergent thyroid ultrasound as clinically indicated. Electronically Signed   By: Katherine Mantle M.D.   On: 10/28/2018 15:17    Procedures Procedures (including critical care time)  Medications Ordered in ED Medications - No data to display   Initial Impression / Assessment and Plan / ED Course  I have reviewed the triage vital signs and the nursing notes.  Pertinent labs & imaging results that were available during my care of the patient were reviewed by me and considered in my medical decision making (see chart for details).   Final Clinical Impressions(s) / ED Diagnoses   Final diagnoses:  Epigastric pain  Nausea    ED Discharge Orders    None       Raeford Razor, MD 10/30/18 [id number]

## 2018-11-13 ENCOUNTER — Ambulatory Visit (INDEPENDENT_AMBULATORY_CARE_PROVIDER_SITE_OTHER): Payer: Self-pay | Admitting: Family Medicine

## 2018-11-13 ENCOUNTER — Other Ambulatory Visit: Payer: Self-pay

## 2018-11-13 DIAGNOSIS — M7989 Other specified soft tissue disorders: Secondary | ICD-10-CM

## 2018-11-13 DIAGNOSIS — E119 Type 2 diabetes mellitus without complications: Secondary | ICD-10-CM

## 2018-11-13 DIAGNOSIS — R0602 Shortness of breath: Secondary | ICD-10-CM

## 2018-11-13 DIAGNOSIS — I10 Essential (primary) hypertension: Secondary | ICD-10-CM

## 2018-11-13 DIAGNOSIS — I252 Old myocardial infarction: Secondary | ICD-10-CM

## 2018-11-13 MED ORDER — GLIPIZIDE 5 MG PO TABS: 5.0000 mg | ORAL_TABLET | Freq: Every day | ORAL | 3 refills | Status: AC

## 2018-11-13 MED ORDER — LOSARTAN POTASSIUM 100 MG PO TABS: 100.0000 mg | ORAL_TABLET | Freq: Every day | ORAL | 3 refills | Status: DC

## 2018-11-13 MED ORDER — SPIRONOLACTONE 50 MG PO TABS: 50.0000 mg | ORAL_TABLET | Freq: Every day | ORAL | 2 refills | Status: DC

## 2018-11-13 NOTE — Progress Notes (Signed)
Virtual Visit via Telephone Note  I connected with Courtney Stewart on 11/13/18 at 10:50 AM EDT by telephone and verified that I am speaking with the correct person using two identifiers.  Called back to initiate telephone visit. Call unanswered. Left voicemail message.   Location: Patient:Located at home during today's encounter  Provider: Located at primary care office    I discussed the limitations, risks, security and privacy concerns of performing an evaluation and management service by telephone and the availability of in person appointments. I also discussed with the patient that there may be a patient responsible charge related to this service. The patient expressed understanding and agreed to proceed.  History of Present Illness: Courtney Stewart, female , history of NSTEMI and poorly controlled hypertension, is present during today's encounter concern for persistently elevated blood pressure readings. She reports her blood pressure systolic readings have been consistently greater than 150 although, never higher than 170/80. She reports her diastolic readings are consistently in the 80s. She is compliant with medication and her current regimen consist losartan 100 mg.  Patient has previous reactions to multiple blood pressure medication (see allergy list).  She endorses edema and shortness of breath.  She is currently not prescribed a diuretic.  She complains of shortness of breath with exertion.  She denies chest pain.  She is a non-smoker.  She has not followed by cardiology for history of CAD.   Assessment and Plan: 1. Essential hypertension -Continue losartan 100 mg once daily.  Adding spironolactone 50 mg once daily.  We will closely monitor potassium as patient is currently on a are which is also low potassium sparing.  Patient advised to check blood pressure consistently daily and notify the office of blood pressure readings do not improve with added medication.  Patient is to return in 2 weeks  for face-to-face follow-up for hypertension evaluation.  Patient is also been referred to cardiology. - Ambulatory referral to Cardiology  2. Shortness of breath -Currently on ACE.  Was intolerant of a beta-blocker in the past therefore will not start carvedilol today.  Adding spironolactone 50 mg once daily and will titrate to reduce what I suspect is fluid retention. - Ambulatory referral to Cardiology  4. Hx of non-ST elevation myocardial infarction (NSTEMI) Given history of NSTEMI patient requires follow-up with cardiology as history of MI places patient at increased risk of progressively worsening cardiovascular disease in the presence of uncontrolled hypertension. - Ambulatory referral to Cardiology  5. Leg swelling -Added spironolactone 50 mg once daily.  Will titrate to achieve desired effect as warranted.   - Ambulatory referral to Cardiology   Overdue health maintenance need pneumococcal vaccine and Tdap Overdue for eye exam (patient is uninsured) HIV on Pap screening. Follow Up Instructions: Return for follow-up in 2 weeks hypertension or shortness of breath   I discussed the assessment and treatment plan with the patient. The patient was provided an opportunity to ask questions and all were answered. The patient agreed with the plan and demonstrated an understanding of the instructions.   The patient was advised to call back or seek an in-person evaluation if the symptoms worsen or if the condition fails to improve as anticipated.  I provided 20 minutes of non-face-to-face time during this encounter.   Joaquin Courts, FNP

## 2018-11-26 ENCOUNTER — Telehealth: Payer: Self-pay

## 2018-11-26 NOTE — Telephone Encounter (Signed)
Called patient to do their pre-visit COVID screening.  Call went to voicemail. Unable to do prescreening.  

## 2018-11-27 ENCOUNTER — Ambulatory Visit: Payer: Self-pay

## 2018-11-27 ENCOUNTER — Other Ambulatory Visit: Payer: Self-pay

## 2018-11-27 ENCOUNTER — Ambulatory Visit: Payer: Self-pay | Admitting: Family Medicine

## 2018-11-27 ENCOUNTER — Encounter: Payer: Self-pay | Admitting: Family Medicine

## 2018-11-27 ENCOUNTER — Ambulatory Visit (INDEPENDENT_AMBULATORY_CARE_PROVIDER_SITE_OTHER): Payer: Self-pay | Admitting: Family Medicine

## 2018-11-27 ENCOUNTER — Ambulatory Visit (INDEPENDENT_AMBULATORY_CARE_PROVIDER_SITE_OTHER): Payer: Self-pay

## 2018-11-27 VITALS — BP 159/106 | HR 103 | Temp 97.8°F | Resp 17 | Ht 62.0 in | Wt 245.6 lb

## 2018-11-27 DIAGNOSIS — R600 Localized edema: Secondary | ICD-10-CM

## 2018-11-27 DIAGNOSIS — R05 Cough: Secondary | ICD-10-CM

## 2018-11-27 DIAGNOSIS — R0602 Shortness of breath: Secondary | ICD-10-CM

## 2018-11-27 DIAGNOSIS — I517 Cardiomegaly: Secondary | ICD-10-CM

## 2018-11-27 DIAGNOSIS — R400 Somnolence: Secondary | ICD-10-CM

## 2018-11-27 DIAGNOSIS — I10 Essential (primary) hypertension: Secondary | ICD-10-CM

## 2018-11-27 DIAGNOSIS — E119 Type 2 diabetes mellitus without complications: Secondary | ICD-10-CM

## 2018-11-27 MED ORDER — SPIRONOLACTONE 50 MG PO TABS: 100.0000 mg | ORAL_TABLET | Freq: Every day | ORAL | 3 refills | Status: DC

## 2018-11-27 MED ORDER — FUROSEMIDE 40 MG PO TABS: 40.0000 mg | ORAL_TABLET | Freq: Two times a day (BID) | ORAL | 0 refills | Status: DC

## 2018-11-27 NOTE — Patient Instructions (Addendum)
I am increasing Spirolactone 100 mg once daily. Adding Furosemide (Lasix) 40 mg BID 5 days. Return on Monday for repeat weight check, blood pressure check, and blood work to check potassium and renal function. Take all medications prior to arrival  Limit fluid intake no more than 1500 ml daily and limit sodium

## 2018-11-27 NOTE — Progress Notes (Signed)
Patient ID: Courtney Stewart, female    DOB: [birth year], 48 y.o.   MRN: [id number]  PCP: Bing NeighborsHarris, Saafir Abdullah S, FNP  Chief Complaint  Patient presents with  . Diabetes  . Hypertension  . Shortness of Breath    Subjective:  HPI  Courtney Stewart is a 48 y.o. female presents for evaluation   Courtney Stewart has Morbid obesity (HCC); Diabetes mellitus without complication (HCC); Essential hypertension; NSTEMI (non-ST elevated myocardial infarction) (HCC); and Personal history of noncompliance with medical treatment, presenting hazards to health on their problem list.   Courtney Stewart complains of gradually worsening shortness of breath over the course of last 3-4 months. Blood pressure is elevated on arrival today, patient reports she has not taken blood pressure medications today. Reports that she checks blood pressures at home daily with She endorses severe swelling of both legs, shortness of breath (notes some improvement since Spirolactone was added. Reports home compliance with medication, however BP is constantly elevated here in office. Endorses occasional non-productive cough. Of note, she has 20 pounds since her last visit in January. She is unable to wear her shoes due to the swelling in her feet and ankles. Patient has a history of an MI, however has not had any follow-up with cardiology since 2017. She was prescribed a beta-blocker although became intolerant and discontinued taking the medication. She is currently prescribed Losartan and Spirolactone for hypertension and fluid retention management. Patient presented to the ER 5/18 with shortness of breath. Recent CTA findings worrisome for underlying heart failure. Patient has been referred to cardiology and already contacted regarding scheduling an appointment.  CT Angio Chest 10/28/18 IMPRESSION  1. No PE identified, however evaluation was mildly limited by motion artifact. 2. Dilated main pulmonary artery which can be seen in patients  with elevated PA pressures. 3. Cardiomegaly with findings of volume overload. There is reflux of contrast into the IVC consistent with underlying cardiac dysfunction 4. Small right-sided pleural effusion. 5. Small volume abdominal ascites noted in the upper abdomen. There is probable underlying hepatic steatosis. 6. Right-sided thyroid nodule. This can be further evaluated with an outpatient nonemergent thyroid ultrasound as clinically indicated  Social History   Socioeconomic History  . Marital status: Single    Spouse name: Not on file  . Number of children: Not on file  . Years of education: Not on file  . Highest education level: Not on file  Occupational History  . Not on file  Social Needs  . Financial resource strain: Not on file  . Food insecurity    Worry: Not on file    Inability: Not on file  . Transportation needs    Medical: Not on file    Non-medical: Not on file  Tobacco Use  . Smoking status: Never Smoker  . Smokeless tobacco: Never Used  Substance and Sexual Activity  . Alcohol use: No    Alcohol/week: 0.0 standard drinks  . Drug use: No  . Sexual activity: Yes  Lifestyle  . Physical activity    Days per week: Not on file    Minutes per session: Not on file  . Stress: Not on file  Relationships  . Social Musicianconnections    Talks on phone: Not on file    Gets together: Not on file    Attends religious service: Not on file    Active member of club or organization: Not on file    Attends meetings of clubs or organizations: Not on file    Relationship status:  Not on file  . Intimate partner violence    Fear of current or ex partner: Not on file    Emotionally abused: Not on file    Physically abused: Not on file    Forced sexual activity: Not on file  Other Topics Concern  . Not on file  Social History Narrative  . Not on file    Family History  Problem Relation Age of Onset  . Diabetes Mother   . Hyperlipidemia Mother   . Hypertension Mother    . Kidney disease Mother   . Colon cancer Father      Review of Systems  Pertinent negatives listed in HPI  Allergies  Allergen Reactions  . Latex Shortness Of Breath and Swelling  . Peanut Oil Anaphylaxis  . Tomato Anaphylaxis  . Amlodipine     Possibly diarrhea  . Hydrochlorothiazide W-Triamterene     Dehydration  . Metoprolol     Side effect decreased energy  . Oxycodone Other (See Comments)    Pt does not like to take it  . Shellfish-Derived Products     Prior to Admission medications   Medication Sig Start Date End Date Taking? Authorizing Provider  cholecalciferol (VITAMIN D3) 25 MCG (1000 UT) tablet Take 1,000 Units by mouth daily.    [provider]  glipiZIDE (GLUCOTROL) 5 MG tablet Take 1 tablet (5 mg total) by mouth daily before breakfast. 11/13/18   Bing NeighborsHarris, Deiondra Denley S, FNP  glucose blood test strip Test 4-5 times a week before eating and 3-4 times a week 2 hours after eating. Pt uses- one touch verio IQ meter 08/24/15   Henson, Vickie L, NP-C  losartan (COZAAR) 100 MG tablet Take 1 tablet (100 mg total) by mouth daily. 11/13/18   Bing NeighborsHarris, Mikele Sifuentes S, FNP  ondansetron (ZOFRAN) 4 MG tablet Take 1 tablet (4 mg total) by mouth every 6 (six) hours as needed for nausea or vomiting. 10/28/18   Raeford RazorKohut, Stephen, MD  [facility] DELICA LANCETS FINE MISC TEST 4 TO 5 TMES A WEEK BEFORE EATING AND 3 TO 4 TIMES A WEEK, 2 HOURS AFTER EATING 08/24/15   Henson, Vickie L, NP-C  spironolactone (ALDACTONE) 50 MG tablet Take 1 tablet (50 mg total) by mouth daily. 11/13/18   Bing NeighborsHarris, Shailynn Fong S, FNP    Past Medical, Surgical Family and Social History reviewed and updated.    Objective:   Today's Vitals   11/27/18 1059  Height: 5\' 2"  (1.575 m)    BP Readings from Last 3 Encounters:  10/28/18 (!) 163/109  10/04/18 (!) 146/92  10/02/18 (!) 157/99    Filed Weights   11/27/18 1059  Weight: 245 lb 9.6 oz (111.4 kg)       Physical Exam Constitutional:      Appearance: She is  obese.  HENT:     Head: Normocephalic.  Cardiovascular:     Rate and Rhythm: Normal rate and regular rhythm.     Comments: +3 Pitting edema bilateral legs, ankles and feet. Pulmonary:     Breath sounds: Decreased breath sounds present.  Abdominal:     General: Bowel sounds are normal. There is distension.  Skin:    General: Skin is warm.     Coloration: Skin is ashen.     Findings: Rash present. Rash is scaling.  Neurological:     Mental Status: She is alert and oriented to person, place, and time. Mental status is at baseline.  Psychiatric:        Attention  and Perception: Attention and perception normal.      Dg Chest 2 View  Result Date: 11/27/2018 CLINICAL DATA:  Chronic shortness of breath and cough for the past 3 months. EXAM: CHEST - 2 VIEW COMPARISON:  CT chest and chest x-ray dated Oct 28, 2018. FINDINGS: Stable moderate cardiomegaly. Normal mediastinal contours. Normal pulmonary vascularity. No focal consolidation, pleural effusion, or pneumothorax. No acute osseous abnormality. IMPRESSION: 1. Stable moderate cardiomegaly.  No active cardiopulmonary disease. Electronically Signed   By: Titus Dubin M.D.   On: 11/27/2018 11:57   Ct Angio Chest Pe W And/or Wo Contrast  Result Date: 10/28/2018 CLINICAL DATA:  Upper abdominal pain/chest pain x1 week. EXAM: CT ANGIOGRAPHY CHEST WITH CONTRAST TECHNIQUE: Multidetector CT imaging of the chest was performed using the standard protocol during bolus administration of intravenous contrast. Multiplanar CT image reconstructions and MIPs were obtained to evaluate the vascular anatomy. CONTRAST:  26mL OMNIPAQUE IOHEXOL 350 MG/ML SOLN COMPARISON:  CT dated 10/02/2018 FINDINGS: Cardiovascular: The heart size is enlarged. Evaluation for pulmonary emboli is limited by motion artifact. Given this limitation, no definite PE identified. The main pulmonary artery is dilated measuring 3.7 cm. There is reflux of contrast in the IVC. Mediastinum/Nodes:  No enlarged mediastinal, hilar, or axillary lymph nodes. There is a right-sided thyroid nodule. Lungs/Pleura: There is a somewhat mosaic appearance of the lung parenchyma, greatest within the left upper lobe. There is mild interlobular septal thickening. Atelectasis is noted in the lingula. There is a small right-sided pleural effusion. There is no discrete lung nodule, however evaluation is somewhat limited by respiratory motion artifact. Upper Abdomen: A small volume of abdominal ascites is noted in the upper abdomen. The liver parenchyma it is hypodense, likely representing hepatic steatosis. Musculoskeletal: No chest wall abnormality. No acute or significant osseous findings. Review of the MIP images confirms the above findings. IMPRESSION: 1. No PE identified, however evaluation was mildly limited by motion artifact. 2. Dilated main pulmonary artery which can be seen in patients with elevated PA pressures. 3. Cardiomegaly with findings of volume overload. There is reflux of contrast into the IVC consistent with underlying cardiac dysfunction 4. Small right-sided pleural effusion. 5. Small volume abdominal ascites noted in the upper abdomen. There is probable underlying hepatic steatosis. 6. Right-sided thyroid nodule. This can be further evaluated with an outpatient nonemergent thyroid ultrasound as clinically indicated. Electronically Signed   By: Constance Holster M.D.   On: 10/28/2018 15:17    Lab Results  Component Value Date   POCGLU 97 06/24/2018    Lab Results  Component Value Date   HGBA1C 7.0 (H) 06/24/2018         Assessment & Plan:  1. Shortness of breath 2. Bilateral leg edema Suspect symptoms are related to underlying heart failure.  Advised patient to return call to cardiology and schedule an appointment as soon as possible.  Patient had an abnormal BNP 933.0 (10/04/18) in the presence of shortness of breath symptoms. Completed a chest x-ray today which showed stable  cardiomegaly.  Patient has had a 20 pound weight gain since January and is grossly edematous bilateral legs and distended abdomen. Will diurese as follows: Furosemide 40 mg twice daily x5 days Increase spironolactone 50 mg to 100 mg once daily Limit fluids to 1500 mL/day Gain better control of blood pressure and consistency with taking medication. Avoid sodium Patient to return in 5 days for repeat labs-evaluate renal function and potassium level, weight check, and blood pressure check  3. Accelerated  hypertension -Suspect this is the underlying cause of patient's current heart failure symptoms poorly controlled hypertension.  Patient has had historical high blood pressure readings when presenting to the office in spite of titration of medications.  She will continue losartan 100 mg once daily and will hopefully benefit from improved blood pressure readings with increase her spironolactone to 100 mg daily patient will return in 5 days for blood pressure recheck. - Comprehensive metabolic panel - Brain natriuretic peptide  4. Diabetes mellitus without complication (HCC),  Previously well controlled with an A1c of 7.05 months ago.  Patient is intolerant of metformin currently takes glipizide but reports poor appetite recently.  Advised to half glipizide to 2.5 if she has a light meal to avoid hypoglycemia.  She is also advised not to take medication if needed does not eat. -Rechecking hemoglobin A1c  5. Sleepiness We will repeat a TSH level however has remained within normal range since January.  Suspect patient may have underlying obstructive sleep apnea given her weight and shortness of breath.  Patient is currently uninsured advised to complete financial assistance paperwork in order to have a sleep study performed. - TSH   Return to office in 5 days for repeat labs, weight check, complete vitals. The following day schedule telemedicine visit for follow-up of shortness of breath and to  review lab results.   Joaquin CourtsKimberly Jazlyne Gauger, FNP [facility] [address], [place] [id number] [phone]: [phone]

## 2018-11-28 LAB — BRAIN NATRIURETIC PEPTIDE

## 2018-11-28 LAB — COMPREHENSIVE METABOLIC PANEL
ALT: 72 IU/L — ABNORMAL HIGH (ref 0–32)
AST: 39 IU/L (ref 0–40)
Albumin/Globulin Ratio: 1.1 — ABNORMAL LOW (ref 1.2–2.2)
Albumin: 3.4 g/dL — ABNORMAL LOW (ref 3.8–4.8)
Alkaline Phosphatase: 103 IU/L (ref 39–117)
BUN/Creatinine Ratio: 17 (ref 9–23)
BUN: 30 mg/dL — ABNORMAL HIGH (ref 6–24)
Bilirubin Total: 1 mg/dL (ref 0.0–1.2)
CO2: 18 mmol/L — ABNORMAL LOW (ref 20–29)
Calcium: 9.6 mg/dL (ref 8.7–10.2)
Chloride: 105 mmol/L (ref 96–106)
Creatinine, Ser: 1.75 mg/dL — ABNORMAL HIGH (ref 0.57–1.00)
GFR calc Af Amer: 40 mL/min/{1.73_m2} — ABNORMAL LOW (ref >59)
GFR calc non Af Amer: 34 mL/min/{1.73_m2} — ABNORMAL LOW (ref >59)
Globulin, Total: 3.1 g/dL (ref 1.5–4.5)
Glucose: 153 mg/dL — ABNORMAL HIGH (ref 65–99)
Potassium: 4.8 mmol/L (ref 3.5–5.2)
Sodium: 142 mmol/L (ref 134–144)
Total Protein: 6.5 g/dL (ref 6.0–8.5)

## 2018-11-28 LAB — TSH: TSH: 5.3 u[IU]/mL — ABNORMAL HIGH (ref 0.450–4.500)

## 2018-11-28 LAB — HEMOGLOBIN A1C
Est. average glucose Bld gHb Est-mCnc: 160 mg/dL
Hgb A1c MFr Bld: 7.2 % — ABNORMAL HIGH (ref 4.8–5.6)

## 2018-12-02 ENCOUNTER — Other Ambulatory Visit: Payer: Self-pay | Admitting: Family Medicine

## 2018-12-02 ENCOUNTER — Ambulatory Visit: Payer: Self-pay

## 2018-12-02 DIAGNOSIS — R600 Localized edema: Secondary | ICD-10-CM

## 2018-12-02 DIAGNOSIS — R0602 Shortness of breath: Secondary | ICD-10-CM

## 2018-12-02 DIAGNOSIS — I10 Essential (primary) hypertension: Secondary | ICD-10-CM

## 2018-12-02 NOTE — Progress Notes (Signed)
Lab only 

## 2018-12-03 ENCOUNTER — Other Ambulatory Visit: Payer: Self-pay | Admitting: Family Medicine

## 2018-12-03 ENCOUNTER — Ambulatory Visit: Payer: Self-pay | Admitting: Family Medicine

## 2018-12-03 ENCOUNTER — Other Ambulatory Visit: Payer: Self-pay

## 2018-12-03 VITALS — BP 149/84 | HR 76 | Temp 97.5°F | Resp 17 | Wt 249.2 lb

## 2018-12-03 DIAGNOSIS — I10 Essential (primary) hypertension: Secondary | ICD-10-CM

## 2018-12-03 DIAGNOSIS — R0602 Shortness of breath: Secondary | ICD-10-CM

## 2018-12-03 DIAGNOSIS — R600 Localized edema: Secondary | ICD-10-CM

## 2018-12-03 MED ORDER — ONDANSETRON 8 MG PO TBDP: 8.0000 mg | ORAL_TABLET | Freq: Three times a day (TID) | ORAL | 0 refills | Status: DC | PRN

## 2018-12-03 NOTE — Progress Notes (Signed)
Labs only

## 2018-12-03 NOTE — Progress Notes (Signed)
Patient here for repeat labs & vitals. Lab tech was unable to obtain specimen. KWalker, CMA.

## 2018-12-03 NOTE — Progress Notes (Signed)
Unable to obtain blood work today. Patient is dehydrated and complained of nausea. Sent over Zofran and scheduled for a telemedicine tomorrow.

## 2018-12-03 NOTE — Progress Notes (Deleted)
Called patient to initiate their telephone visit with provider [name], FNP-C. Verified date of birth. Patient states that she is having worsening SHOB & cough. Hasn't called Cardiology to make an appointment. KWalker, CMA.

## 2018-12-04 ENCOUNTER — Ambulatory Visit: Payer: Self-pay | Admitting: Family Medicine

## 2018-12-05 ENCOUNTER — Ambulatory Visit: Payer: Self-pay | Admitting: Family Medicine

## 2018-12-07 NOTE — Progress Notes (Signed)
Patient no showed encounter.  Encounter opened erroneously.

## 2018-12-26 ENCOUNTER — Other Ambulatory Visit: Payer: Self-pay

## 2018-12-26 ENCOUNTER — Emergency Department (HOSPITAL_COMMUNITY)
Admission: EM | Admit: 2018-12-26 | Discharge: 2018-12-26 | Disposition: A | Discharge status: Home / Self Care | Payer: Self-pay | Attending: Emergency Medicine | Admitting: Emergency Medicine

## 2018-12-26 ENCOUNTER — Encounter (HOSPITAL_COMMUNITY): Payer: Self-pay | Admitting: Emergency Medicine

## 2018-12-26 DIAGNOSIS — I252 Old myocardial infarction: Secondary | ICD-10-CM | POA: Insufficient documentation

## 2018-12-26 DIAGNOSIS — Z91010 Allergy to peanuts: Secondary | ICD-10-CM | POA: Insufficient documentation

## 2018-12-26 DIAGNOSIS — E119 Type 2 diabetes mellitus without complications: Secondary | ICD-10-CM | POA: Insufficient documentation

## 2018-12-26 DIAGNOSIS — I10 Essential (primary) hypertension: Secondary | ICD-10-CM | POA: Insufficient documentation

## 2018-12-26 DIAGNOSIS — L299 Pruritus, unspecified: Secondary | ICD-10-CM | POA: Insufficient documentation

## 2018-12-26 DIAGNOSIS — L235 Allergic contact dermatitis due to other chemical products: Secondary | ICD-10-CM | POA: Insufficient documentation

## 2018-12-26 DIAGNOSIS — Z91040 Latex allergy status: Secondary | ICD-10-CM | POA: Insufficient documentation

## 2018-12-26 DIAGNOSIS — Z79899 Other long term (current) drug therapy: Secondary | ICD-10-CM | POA: Insufficient documentation

## 2018-12-26 MED ORDER — TRIAMCINOLONE ACETONIDE 0.025 % EX OINT: 1.0000 "application " | TOPICAL_OINTMENT | Freq: Two times a day (BID) | CUTANEOUS | 1 refills | Status: AC

## 2018-12-26 MED ORDER — PREDNISONE 20 MG PO TABS: 40.0000 mg | ORAL_TABLET | Freq: Every day | ORAL | 0 refills | Status: DC

## 2018-12-26 MED ORDER — FAMOTIDINE IN NACL 20-0.9 MG/50ML-% IV SOLN
20.0000 mg | INTRAVENOUS | Status: AC
  Administered 2018-12-26: 20 mg via INTRAVENOUS
  Filled 2018-12-26: qty 50

## 2018-12-26 MED ORDER — METHYLPREDNISOLONE SODIUM SUCC 125 MG IJ SOLR
125.0000 mg | Freq: Once | INTRAMUSCULAR | Status: AC
  Administered 2018-12-26: 125 mg via INTRAVENOUS
  Filled 2018-12-26: qty 2

## 2018-12-26 MED ORDER — DIPHENHYDRAMINE HCL 50 MG/ML IJ SOLN
25.0000 mg | Freq: Once | INTRAMUSCULAR | Status: AC
  Administered 2018-12-26: 11:00:00 25 mg via INTRAVENOUS
  Filled 2018-12-26: qty 1

## 2018-12-26 NOTE — ED Notes (Signed)
Pt reporting that her itching is returning and her eyes are watering. She was given a cold compress. She requests eye drops. EDP notified.

## 2018-12-26 NOTE — ED Triage Notes (Signed)
Pt here with an allergic reaction that started July 13th. The reaction has progressed the last 3 days and today her eye is swelling and tongue is tingling. Pt is has rash down her arms on her neck, on her abd and legs. Pt states she used a new hair conditioner on the 13th.

## 2018-12-26 NOTE — ED Notes (Signed)
Pt verbalized understanding of discharge instructions and denies any further questions at this time.   

## 2018-12-26 NOTE — ED Provider Notes (Signed)
[name] DEPART[facility] Provider Note   CSN: [id number] Arrival date & time: 12/26/18  1025    History   Chief Complaint Chief Complaint  Patient presents with  . Allergic Reaction    HPI Courtney Stewart is a 48 y.o. female.     HPI  The patient is a 48 year old female, she has a known history of diabetes as well as a recent history of admission to the hospital for what appeared to be congestive heart failure.  She reportedly had a normal heart catheterization in 2017 with normal coronary arteries and a left ventricular systolic function of 27%.  I have reviewed the medical records including the medical records from the outside hospital where the patient was admitted a couple of weeks ago.  She had presented to the hospital with worsening shortness of breath and lower extremity edema and was found to have "a submassive pulmonary embolism".  She also had been told that her ejection fraction was severely depressed, she had an acute kidney injury as well.  Her ejection fraction was 15%, and a repeat catheterization showed no coronary disease of significance.  The patient returns today with a complaint of a diffuse rash and swelling of her eyelids.  According to her medical record she had been started on multiple medications a couple of weeks ago including atorvastatin, carvedilol, hydralazine, Imdur, Rivaroxaban and torsemide.  She had been on Losartan intermittently in the past - then 3 days ago she started to have a rash on her arms and legs - and then developed swelling of the eyelids and the the tongue started to have some numbness today with a feeling of shortness of breath in her neck.  The rash is very itchy, it is located on the arms and the legs.  She denies any history of prior allergy though she does endorse in the last week changing her hair conditioner, topical body soap as well as dishwashing detergent.  Past Medical History:  Diagnosis Date  .  Anemia   . Diabetes mellitus without complication ([place])   . Hypertension   . NSTEMI (non-ST elevated myocardial infarction) ([place])   . UC (ulcerative colitis) [facility])     Patient Active Problem List   Diagnosis Date Noted  . Personal history of noncompliance with medical treatment, presenting hazards to health 11/16/2016  . NSTEMI (non-ST elevated myocardial infarction) ([place]) 12/21/2015  . Morbid obesity ([place]) 08/12/2015  . Diabetes mellitus without complication ([place]) 78/24/2353  . Essential hypertension 08/12/2015    Past Surgical History:  Procedure Laterality Date  . CARDIAC CATHETERIZATION N/A 12/22/2015   Procedure: Left Heart Cath and Coronary Angiography;  Surgeon: Adrian Prows, MD;  Location: [facility];  Service: Cardiovascular;  Laterality: N/A;  . CESAREAN SECTION       OB History   No obstetric history on file.      Home Medications    Prior to Admission medications   Medication Sig Start Date End Date Taking? Authorizing Provider  atorvastatin (LIPITOR) 20 MG tablet Take 20 mg by mouth every evening. 12/11/18 01/10/19 Yes [provider]  carvedilol (COREG) 6.25 MG tablet Take 6.25 mg by mouth 2 (two) times a day. 12/11/18 01/10/19 Yes [provider]  cholecalciferol (VITAMIN D3) 25 MCG (1000 UT) tablet Take 1,000 Units by mouth daily.   Yes [provider]  hydrALAZINE (APRESOLINE) 25 MG tablet Take 25 mg by mouth every 8 (eight) hours. 12/11/18 01/10/19 Yes [provider]  isosorbide  mononitrate (IMDUR) 60 MG 24 hr tablet Take 60 mg by mouth daily. 12/11/18  Yes [provider]  losartan (COZAAR) 25 MG tablet Take 25 mg by mouth daily. 12/11/18 01/10/19 Yes [provider]  rivaroxaban (XARELTO) 20 MG TABS tablet Take 20 mg by mouth daily with supper. 12/11/18 12/11/19 Yes [provider]  torsemide (DEMADEX) 20 MG tablet Take 60 mg by mouth 2 (two) times a day. 12/11/18 01/10/19 Yes [provider]   furosemide (LASIX) 40 MG tablet Take 1 tablet (40 mg total) by mouth 2 (two) times daily for 5 days. Patient not taking: Reported on 12/26/2018 11/27/18 12/02/18  Bing NeighborsHarris, Kimberly S, FNP  glipiZIDE (GLUCOTROL) 5 MG tablet Take 1 tablet (5 mg total) by mouth daily before breakfast. Patient not taking: Reported on 12/26/2018 11/13/18   Bing NeighborsHarris, Kimberly S, FNP  glucose blood test strip Test 4-5 times a week before eating and 3-4 times a week 2 hours after eating. Pt uses- one touch verio IQ meter 08/24/15   Henson, Vickie L, NP-C  losartan (COZAAR) 100 MG tablet Take 1 tablet (100 mg total) by mouth daily. Patient not taking: Reported on 12/26/2018 11/13/18   Bing NeighborsHarris, Kimberly S, FNP  ondansetron (ZOFRAN-ODT) 8 MG disintegrating tablet Take 1 tablet (8 mg total) by mouth every 8 (eight) hours as needed for nausea. Patient not taking: Reported on 12/26/2018 12/03/18   Bing NeighborsHarris, Kimberly S, FNP  ONETOUCH DELICA LANCETS FINE MISC TEST 4 TO 5 TMES A WEEK BEFORE EATING AND 3 TO 4 TIMES A WEEK, 2 HOURS AFTER EATING 08/24/15   Henson, Vickie L, NP-C  predniSONE (DELTASONE) 20 MG tablet Take 2 tablets (40 mg total) by mouth daily. 12/26/18   Eber HongMiller, Adelie Croswell, MD  spironolactone (ALDACTONE) 50 MG tablet Take 2 tablets (100 mg total) by mouth daily. Patient not taking: Reported on 12/26/2018 11/27/18   Bing NeighborsHarris, Kimberly S, FNP  triamcinolone (KENALOG) 0.025 % ointment Apply 1 application topically 2 (two) times daily. Do not apply to face 12/26/18   Eber HongMiller, Braylan Faul, MD    Family History Family History  Problem Relation Age of Onset  . Diabetes Mother   . Hyperlipidemia Mother   . Hypertension Mother   . Kidney disease Mother   . Colon cancer Father     Social History Social History   Tobacco Use  . Smoking status: Never Smoker  . Smokeless tobacco: Never Used  Substance Use Topics  . Alcohol use: No    Alcohol/week: 0.0 standard drinks  . Drug use: No     Allergies   Latex, Peanut oil, Tomato, Amlodipine,  Hydrochlorothiazide w-triamterene, Metoprolol, Oxycodone, and Shellfish-derived products   Review of Systems Review of Systems  All other systems reviewed and are negative.    Physical Exam Updated Vital Signs BP 134/70   Pulse 81   Resp 19   SpO2 100%   Physical Exam Vitals signs and nursing note reviewed.  Constitutional:      General: She is not in acute distress.    Appearance: She is well-developed.  HENT:     Head: Normocephalic and atraumatic.     Mouth/Throat:     Pharynx: No oropharyngeal exudate.     Comments: The oropharynx is clear and moist, there is no swelling of the tongue or the mucous membranes. Eyes:     General: No scleral icterus.       Right eye: No discharge.        Left eye: No discharge.  Conjunctiva/sclera: Conjunctivae normal.     Pupils: Pupils are equal, round, and reactive to light.     Comments: Periorbital edema left greater than right  Neck:     Musculoskeletal: Normal range of motion and neck supple.     Thyroid: No thyromegaly.     Vascular: No JVD.  Cardiovascular:     Rate and Rhythm: Normal rate and regular rhythm.     Heart sounds: Normal heart sounds. No murmur. No friction rub. No gallop.   Pulmonary:     Effort: Pulmonary effort is normal. No respiratory distress.     Breath sounds: Normal breath sounds. No wheezing or rales.  Abdominal:     General: Bowel sounds are normal. There is no distension.     Palpations: Abdomen is soft. There is no mass.     Tenderness: There is no abdominal tenderness.  Musculoskeletal: Normal range of motion.        General: No tenderness.  Lymphadenopathy:     Cervical: No cervical adenopathy.  Skin:    General: Skin is warm and dry.     Findings: Rash present. No erythema.     Comments: Papular rash across the bilateral upper extremities, there is some scaling, it is pruritic, there is no vesicles pustules petechiae or purpura.  Neurological:     Mental Status: She is alert.      Coordination: Coordination normal.  Psychiatric:        Behavior: Behavior normal.      ED Treatments / Results  Labs (all labs ordered are listed, but only abnormal results are displayed) Labs Reviewed - No data to display  EKG None  Radiology No results found.  Procedures Procedures (including critical care time)  Medications Ordered in ED Medications  methylPREDNISolone sodium succinate (SOLU-MEDROL) 125 mg/2 mL injection 125 mg (125 mg Intravenous Given 12/26/18 1053)  famotidine (PEPCID) IVPB 20 mg premix (0 mg Intravenous Stopped 12/26/18 1212)  diphenhydrAMINE (BENADRYL) injection 25 mg (25 mg Intravenous Given 12/26/18 1053)     Initial Impression / Assessment and Plan / ED Course  I have reviewed the triage vital signs and the nursing notes.  Pertinent labs & imaging results that were available during my care of the patient were reviewed by me and considered in my medical decision making (see chart for details).        Despite starting all these medications recently I suspect that the patient's symptoms are coming from a contact dermatitis related to new personal hygiene products.  She will be given prednisone, antihistamines and will need to be started on some topical steroids, I have advised the patient that she needs to continue her oral medications due to her recent illness and the likelihood that this is more of a contact dermatitis, she is agreeable.  She will be watched in the emergency department for a couple of hours to make sure that there is no progression of swelling of the airway albeit subjective appearing at this time.  The patient was reevaluated at 12:45 PM, she is doing well, she has less itching but still is itching diffusely on her skin consistent with a dermatitis.  She states that she has no tongue numbness or swelling or throat symptoms at all, that is much better.  Her phonation is normal, she is very stable for discharge and has been instructed  on the use of prednisone and triamcinolone ointment.  I do not think this is related to her medications that were added  2 weeks ago.  It correlates with the change in body hygiene products.  She was instructed on getting rid of those topical body products and going with hypoallergenic products instead.  Final Clinical Impressions(s) / ED Diagnoses   Final diagnoses:  Allergic dermatitis due to other chemical product    ED Discharge Orders         Ordered    predniSONE (DELTASONE) 20 MG tablet  Daily     12/26/18 1242    triamcinolone (KENALOG) 0.025 % ointment  2 times daily     12/26/18 1242           Eber HongMiller, Shanaya Schneck, MD 12/26/18 1245

## 2018-12-26 NOTE — Discharge Instructions (Signed)
Your rash and itching is likely related to the change in the topical products including soaps, lotions, shampoos, conditioners for your skin and hair.  Please go back to what you are using before, if you do not have access to that please use Dove products which are very sensitive for the skin.  You may have a rash for another couple of weeks but it should gradually improve with the medications provided including the following:  1.,  Prednisone 40 mg daily for 5 days  2.  Triamcinolone ointment, apply this daily every 12 hours for itching and rash.  You may also want to try Aquaphor ointment to help keep the skin hydrated.
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
## 2021-01-23 ENCOUNTER — Emergency Department (HOSPITAL_BASED_OUTPATIENT_CLINIC_OR_DEPARTMENT_OTHER): Payer: No Typology Code available for payment source

## 2021-01-23 ENCOUNTER — Encounter (HOSPITAL_BASED_OUTPATIENT_CLINIC_OR_DEPARTMENT_OTHER): Payer: Self-pay | Admitting: Emergency Medicine

## 2021-01-23 ENCOUNTER — Emergency Department (HOSPITAL_BASED_OUTPATIENT_CLINIC_OR_DEPARTMENT_OTHER)
Admission: EM | Admit: 2021-01-23 | Discharge: 2021-01-23 | Disposition: A | Discharge status: Home / Self Care | Payer: No Typology Code available for payment source | Attending: Emergency Medicine | Admitting: Emergency Medicine

## 2021-01-23 DIAGNOSIS — Z91040 Latex allergy status: Secondary | ICD-10-CM | POA: Insufficient documentation

## 2021-01-23 DIAGNOSIS — M542 Cervicalgia: Secondary | ICD-10-CM | POA: Diagnosis not present

## 2021-01-23 DIAGNOSIS — Y92410 Unspecified street and highway as the place of occurrence of the external cause: Secondary | ICD-10-CM | POA: Diagnosis not present

## 2021-01-23 DIAGNOSIS — E119 Type 2 diabetes mellitus without complications: Secondary | ICD-10-CM | POA: Diagnosis not present

## 2021-01-23 DIAGNOSIS — Z79899 Other long term (current) drug therapy: Secondary | ICD-10-CM | POA: Insufficient documentation

## 2021-01-23 DIAGNOSIS — M545 Low back pain, unspecified: Secondary | ICD-10-CM | POA: Insufficient documentation

## 2021-01-23 DIAGNOSIS — Z7984 Long term (current) use of oral hypoglycemic drugs: Secondary | ICD-10-CM | POA: Diagnosis not present

## 2021-01-23 DIAGNOSIS — R519 Headache, unspecified: Secondary | ICD-10-CM | POA: Insufficient documentation

## 2021-01-23 DIAGNOSIS — M546 Pain in thoracic spine: Secondary | ICD-10-CM | POA: Diagnosis not present

## 2021-01-23 DIAGNOSIS — I10 Essential (primary) hypertension: Secondary | ICD-10-CM | POA: Diagnosis not present

## 2021-01-23 DIAGNOSIS — Z7901 Long term (current) use of anticoagulants: Secondary | ICD-10-CM | POA: Diagnosis not present

## 2021-01-23 MED ORDER — ACETAMINOPHEN 500 MG PO TABS
1000.0000 mg | ORAL_TABLET | Freq: Once | ORAL | Status: AC
  Administered 2021-01-23: 1000 mg via ORAL
  Filled 2021-01-23: qty 2

## 2021-01-23 MED ORDER — CYCLOBENZAPRINE HCL 10 MG PO TABS
10.0000 mg | ORAL_TABLET | Freq: Once | ORAL | Status: AC
  Administered 2021-01-23: 10 mg via ORAL
  Filled 2021-01-23: qty 1
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
## 2021-04-15 ENCOUNTER — Emergency Department (HOSPITAL_COMMUNITY): Payer: Self-pay

## 2021-04-15 ENCOUNTER — Encounter (HOSPITAL_COMMUNITY): Payer: Self-pay | Admitting: Emergency Medicine

## 2021-04-15 ENCOUNTER — Inpatient Hospital Stay (HOSPITAL_COMMUNITY)
Admission: EM | Admit: 2021-04-15 | Discharge: 2021-04-19 | DRG: 064 | Disposition: A | Discharge status: Home / Self Care | Payer: Self-pay | Attending: Neurology | Admitting: Neurology

## 2021-04-15 ENCOUNTER — Other Ambulatory Visit: Payer: Self-pay

## 2021-04-15 ENCOUNTER — Inpatient Hospital Stay (HOSPITAL_COMMUNITY): Payer: Self-pay

## 2021-04-15 DIAGNOSIS — Z7901 Long term (current) use of anticoagulants: Secondary | ICD-10-CM

## 2021-04-15 DIAGNOSIS — Z79899 Other long term (current) drug therapy: Secondary | ICD-10-CM

## 2021-04-15 DIAGNOSIS — Z91013 Allergy to seafood: Secondary | ICD-10-CM | POA: Diagnosis not present

## 2021-04-15 DIAGNOSIS — Z86718 Personal history of other venous thrombosis and embolism: Secondary | ICD-10-CM

## 2021-04-15 DIAGNOSIS — Z20822 Contact with and (suspected) exposure to covid-19: Secondary | ICD-10-CM | POA: Diagnosis present

## 2021-04-15 DIAGNOSIS — Z833 Family history of diabetes mellitus: Secondary | ICD-10-CM

## 2021-04-15 DIAGNOSIS — E1122 Type 2 diabetes mellitus with diabetic chronic kidney disease: Secondary | ICD-10-CM | POA: Diagnosis present

## 2021-04-15 DIAGNOSIS — I129 Hypertensive chronic kidney disease with stage 1 through stage 4 chronic kidney disease, or unspecified chronic kidney disease: Secondary | ICD-10-CM | POA: Diagnosis present

## 2021-04-15 DIAGNOSIS — G936 Cerebral edema: Secondary | ICD-10-CM | POA: Diagnosis present

## 2021-04-15 DIAGNOSIS — Z7952 Long term (current) use of systemic steroids: Secondary | ICD-10-CM | POA: Diagnosis not present

## 2021-04-15 DIAGNOSIS — Z7984 Long term (current) use of oral hypoglycemic drugs: Secondary | ICD-10-CM | POA: Diagnosis not present

## 2021-04-15 DIAGNOSIS — Z86711 Personal history of pulmonary embolism: Secondary | ICD-10-CM

## 2021-04-15 DIAGNOSIS — G8194 Hemiplegia, unspecified affecting left nondominant side: Secondary | ICD-10-CM | POA: Diagnosis present

## 2021-04-15 DIAGNOSIS — I610 Nontraumatic intracerebral hemorrhage in hemisphere, subcortical: Secondary | ICD-10-CM | POA: Diagnosis present

## 2021-04-15 DIAGNOSIS — Z6841 Body Mass Index (BMI) 40.0 and over, adult: Secondary | ICD-10-CM

## 2021-04-15 DIAGNOSIS — I161 Hypertensive emergency: Secondary | ICD-10-CM | POA: Diagnosis present

## 2021-04-15 DIAGNOSIS — N189 Chronic kidney disease, unspecified: Secondary | ICD-10-CM | POA: Diagnosis present

## 2021-04-15 DIAGNOSIS — I619 Nontraumatic intracerebral hemorrhage, unspecified: Secondary | ICD-10-CM | POA: Diagnosis present

## 2021-04-15 DIAGNOSIS — Z885 Allergy status to narcotic agent status: Secondary | ICD-10-CM

## 2021-04-15 DIAGNOSIS — I252 Old myocardial infarction: Secondary | ICD-10-CM

## 2021-04-15 DIAGNOSIS — Z8249 Family history of ischemic heart disease and other diseases of the circulatory system: Secondary | ICD-10-CM | POA: Diagnosis not present

## 2021-04-15 DIAGNOSIS — D6852 Prothrombin gene mutation: Secondary | ICD-10-CM | POA: Diagnosis present

## 2021-04-15 DIAGNOSIS — Z91010 Allergy to peanuts: Secondary | ICD-10-CM | POA: Diagnosis not present

## 2021-04-15 DIAGNOSIS — Z800 Family history of malignant neoplasm of digestive organs: Secondary | ICD-10-CM | POA: Diagnosis not present

## 2021-04-15 DIAGNOSIS — R29705 NIHSS score 5: Secondary | ICD-10-CM | POA: Diagnosis present

## 2021-04-15 DIAGNOSIS — E785 Hyperlipidemia, unspecified: Secondary | ICD-10-CM | POA: Diagnosis present

## 2021-04-15 DIAGNOSIS — E1165 Type 2 diabetes mellitus with hyperglycemia: Secondary | ICD-10-CM | POA: Diagnosis present

## 2021-04-15 DIAGNOSIS — Z9114 Patient's other noncompliance with medication regimen: Secondary | ICD-10-CM

## 2021-04-15 DIAGNOSIS — R29810 Facial weakness: Secondary | ICD-10-CM | POA: Diagnosis present

## 2021-04-15 LAB — DIFFERENTIAL
Abs Immature Granulocytes: 0.07 10*3/uL (ref 0.00–0.07)
Basophils Absolute: 0.1 10*3/uL (ref 0.0–0.1)
Basophils Relative: 1 %
Eosinophils Absolute: 0.2 10*3/uL (ref 0.0–0.5)
Eosinophils Relative: 3 %
Immature Granulocytes: 1 %
Lymphocytes Relative: 21 %
Lymphs Abs: 1.7 10*3/uL (ref 0.7–4.0)
Monocytes Absolute: 0.6 10*3/uL (ref 0.1–1.0)
Monocytes Relative: 7 %
Neutro Abs: 5.3 10*3/uL (ref 1.7–7.7)
Neutrophils Relative %: 67 %

## 2021-04-15 LAB — I-STAT CHEM 8, ED
BUN: 13 mg/dL (ref 6–20)
Calcium, Ion: 1.11 mmol/L — ABNORMAL LOW (ref 1.15–1.40)
Chloride: 106 mmol/L (ref 98–111)
Creatinine, Ser: 0.9 mg/dL (ref 0.44–1.00)
Glucose, Bld: 164 mg/dL — ABNORMAL HIGH (ref 70–99)
HCT: 38 % (ref 36.0–46.0)
Hemoglobin: 12.9 g/dL (ref 12.0–15.0)
Potassium: 3.9 mmol/L (ref 3.5–5.1)
Sodium: 139 mmol/L (ref 135–145)
TCO2: 25 mmol/L (ref 22–32)

## 2021-04-15 LAB — COMPREHENSIVE METABOLIC PANEL
ALT: 12 U/L (ref 0–44)
AST: 14 U/L — ABNORMAL LOW (ref 15–41)
Albumin: 3.3 g/dL — ABNORMAL LOW (ref 3.5–5.0)
Alkaline Phosphatase: 57 U/L (ref 38–126)
Anion gap: 7 (ref 5–15)
BUN: 11 mg/dL (ref 6–20)
CO2: 24 mmol/L (ref 22–32)
Calcium: 9 mg/dL (ref 8.9–10.3)
Chloride: 106 mmol/L (ref 98–111)
Creatinine, Ser: 0.86 mg/dL (ref 0.44–1.00)
GFR, Estimated: >60 mL/min (ref >60)
Glucose, Bld: 160 mg/dL — ABNORMAL HIGH (ref 70–99)
Potassium: 4 mmol/L (ref 3.5–5.1)
Sodium: 137 mmol/L (ref 135–145)
Total Bilirubin: 0.6 mg/dL (ref 0.3–1.2)
Total Protein: 6.8 g/dL (ref 6.5–8.1)

## 2021-04-15 LAB — SODIUM
Sodium: 138 mmol/L (ref 135–145)
Sodium: 131 mmol/L — ABNORMAL LOW (ref 135–145)

## 2021-04-15 LAB — PROTIME-INR
INR: 1.1 (ref 0.8–1.2)
Prothrombin Time: 14.1 seconds (ref 11.4–15.2)

## 2021-04-15 LAB — CBC
HCT: 38.8 % (ref 36.0–46.0)
Hemoglobin: 12.5 g/dL (ref 12.0–15.0)
MCH: 29.3 pg (ref 26.0–34.0)
MCHC: 32.2 g/dL (ref 30.0–36.0)
MCV: 90.9 fL (ref 80.0–100.0)
Platelets: 306 10*3/uL (ref 150–400)
RBC: 4.27 MIL/uL (ref 3.87–5.11)
RDW: 13.2 % (ref 11.5–15.5)
WBC: 7.9 10*3/uL (ref 4.0–10.5)
nRBC: 0 % (ref 0.0–0.2)

## 2021-04-15 LAB — APTT: aPTT: 32 seconds (ref 24–36)

## 2021-04-15 LAB — RESP PANEL BY RT-PCR (FLU A&B, COVID) ARPGX2
Influenza A by PCR: NEGATIVE
Influenza B by PCR: NEGATIVE
SARS Coronavirus 2 by RT PCR: NEGATIVE

## 2021-04-15 LAB — MRSA NEXT GEN BY PCR, NASAL: MRSA by PCR Next Gen: NOT DETECTED

## 2021-04-15 LAB — I-STAT BETA HCG BLOOD, ED (MC, WL, AP ONLY): I-stat hCG, quantitative: <5 m[IU]/mL (ref <5)

## 2021-04-15 LAB — GLUCOSE, CAPILLARY
Glucose-Capillary: 157 mg/dL — ABNORMAL HIGH (ref 70–99)
Glucose-Capillary: 144 mg/dL — ABNORMAL HIGH (ref 70–99)

## 2021-04-15 LAB — HIV ANTIBODY (ROUTINE TESTING W REFLEX): HIV Screen 4th Generation wRfx: NONREACTIVE

## 2021-04-15 LAB — CBG MONITORING, ED: Glucose-Capillary: 167 mg/dL — ABNORMAL HIGH (ref 70–99)

## 2021-04-15 MED ORDER — SENNOSIDES-DOCUSATE SODIUM 8.6-50 MG PO TABS
1.0000 | ORAL_TABLET | Freq: Two times a day (BID) | ORAL | Status: DC
  Administered 2021-04-16 – 2021-04-19 (×7): 1 via ORAL
  Filled 2021-04-15 (×7): qty 1

## 2021-04-15 MED ORDER — ACETAMINOPHEN 160 MG/5ML PO SOLN: 650.0000 mg | ORAL | Status: DC | PRN

## 2021-04-15 MED ORDER — CHLORHEXIDINE GLUCONATE CLOTH 2 % EX PADS
6.0000 | MEDICATED_PAD | Freq: Every day | CUTANEOUS | Status: DC
  Administered 2021-04-16 – 2021-04-17 (×2): 6 via TOPICAL

## 2021-04-15 MED ORDER — SODIUM CHLORIDE 0.9% FLUSH: 3.0000 mL | Freq: Once | INTRAVENOUS | Status: DC

## 2021-04-15 MED ORDER — CLEVIDIPINE BUTYRATE 0.5 MG/ML IV EMUL
0.0000 mg/h | INTRAVENOUS | Status: DC
  Administered 2021-04-15 (×2): 16 mg/h via INTRAVENOUS
  Administered 2021-04-15: 1 mg/h via INTRAVENOUS
  Administered 2021-04-16: 24 mg/h via INTRAVENOUS
  Administered 2021-04-16 (×2): 28 mg/h via INTRAVENOUS
  Administered 2021-04-16 (×2): 16 mg/h via INTRAVENOUS
  Administered 2021-04-16: 8 mg/h via INTRAVENOUS
  Administered 2021-04-16: 26 mg/h via INTRAVENOUS
  Administered 2021-04-16 (×3): 18 mg/h via INTRAVENOUS
  Administered 2021-04-17: 25 mg/h via INTRAVENOUS
  Administered 2021-04-17: 18 mg/h via INTRAVENOUS
  Administered 2021-04-17: 14 mg/h via INTRAVENOUS
  Administered 2021-04-17: 28 mg/h via INTRAVENOUS
  Administered 2021-04-17: 16 mg/h via INTRAVENOUS
  Administered 2021-04-17: 14 mg/h via INTRAVENOUS
  Administered 2021-04-17: 12 mg/h via INTRAVENOUS
  Administered 2021-04-17: 16 mg/h via INTRAVENOUS
  Administered 2021-04-18: 14 mg/h via INTRAVENOUS
  Administered 2021-04-18: 29 mg/h via INTRAVENOUS
  Administered 2021-04-18: 22 mg/h via INTRAVENOUS
  Administered 2021-04-18: 29 mg/h via INTRAVENOUS
  Administered 2021-04-18: 3 mg/h via INTRAVENOUS
  Administered 2021-04-18: 28 mg/h via INTRAVENOUS
  Administered 2021-04-18: 27 mg/h via INTRAVENOUS
  Filled 2021-04-15 (×9): qty 100
  Filled 2021-04-15: qty 50
  Filled 2021-04-15 (×3): qty 100
  Filled 2021-04-15: qty 200
  Filled 2021-04-15 (×10): qty 100
  Filled 2021-04-15: qty 200
  Filled 2021-04-15: qty 100

## 2021-04-15 MED ORDER — STROKE: EARLY STAGES OF RECOVERY BOOK
Freq: Once | Status: AC
  Filled 2021-04-15: qty 1

## 2021-04-15 MED ORDER — SODIUM CHLORIDE 0.9 % IV SOLN: INTRAVENOUS | Status: DC

## 2021-04-15 MED ORDER — EMPTY CONTAINERS FLEXIBLE MISC
900.0000 mg | Freq: Once | Status: AC
  Administered 2021-04-15: 900 mg via INTRAVENOUS
  Filled 2021-04-15: qty 90

## 2021-04-15 MED ORDER — INSULIN ASPART 100 UNIT/ML IJ SOLN
0.0000 [IU] | INTRAMUSCULAR | Status: DC
  Administered 2021-04-15 (×2): 2 [IU] via SUBCUTANEOUS
  Administered 2021-04-16 (×2): 1 [IU] via SUBCUTANEOUS

## 2021-04-15 MED ORDER — SODIUM CHLORIDE 3 % IV SOLN
INTRAVENOUS | Status: DC
  Filled 2021-04-15 (×3): qty 500
  Filled 2021-04-15: qty 1000
  Filled 2021-04-15 (×2): qty 500

## 2021-04-15 MED ORDER — ACETAMINOPHEN 325 MG PO TABS
650.0000 mg | ORAL_TABLET | ORAL | Status: DC | PRN
  Administered 2021-04-16 – 2021-04-19 (×11): 650 mg via ORAL
  Filled 2021-04-15 (×12): qty 2

## 2021-04-15 MED ORDER — ACETAMINOPHEN 650 MG RE SUPP
650.0000 mg | RECTAL | Status: DC | PRN
  Administered 2021-04-16: 650 mg via RECTAL
  Filled 2021-04-15: qty 1

## 2021-04-15 MED ORDER — PANTOPRAZOLE SODIUM 40 MG IV SOLR
40.0000 mg | INTRAVENOUS | Status: DC
Start: 2021-04-15 — End: 2021-04-18
  Administered 2021-04-15 – 2021-04-17 (×3): 40 mg via INTRAVENOUS
  Filled 2021-04-15 (×3): qty 40

## 2021-04-15 MED ORDER — IOHEXOL 350 MG/ML SOLN
75.0000 mL | Freq: Once | INTRAVENOUS | Status: AC | PRN
  Administered 2021-04-15: 75 mL via INTRAVENOUS

## 2021-04-15 NOTE — Progress Notes (Addendum)
Upon admission patient belongings included clothing and cell phone/charger.  Patient's SBP >140 despite max dosage on Cleviprex. Dr. Roda Shutters provided verbal to increase max dose to 32 and later input that order. No orders for PRNs.  Patient's daughter arrived to unit, all her questions were answered to satisfaction and a work note was provided.

## 2021-04-15 NOTE — ED Provider Notes (Signed)
[facility] EMERGENCY DEPARTMENT Provider Note   CSN: [id number] Arrival date & time: 04/15/21  1536  An emergency department physician performed an initial assessment on this suspected stroke patient at 1538.  History Chief Complaint  Patient presents with   Code Stroke    Courtney Stewart is a 50 y.o. female.  HPI  50 year old female with a PMH significant for ulcerative colitis, type II DM, HTN, obesity who presents to the ED via EMS as a code stroke.  Patient reports that she had a sudden onset right-sided headache that started at 9:00 AM today.  She went to her PCP this morning, and was noted to first have a right-sided facial droop, left-sided weakness and slurred speech at 1:00 PM this afternoon by her daughter.  On arrival, the patient continues to complain of left-sided weakness and right-sided headache unchanged from earlier today, in addition to left-sided paresthesias.  She reports she takes her home Eliquis, last dose was yesterday.  She reports that she takes it once daily instead of the prescribed twice daily.  Denies any recent falls, head injuries, or other illnesses.  She denies any fevers or chills, chest pain or shortness of breath, nausea, dizziness or lightheadedness, visual changes, or other complaints.   Past Medical History:  Diagnosis Date   Anemia    Diabetes mellitus without complication ([place])    Hypertension    NSTEMI (non-ST elevated myocardial infarction) ([place])    UC (ulcerative colitis) ([place])     Patient Active Problem List   Diagnosis Date Noted   ICH (intracerebral hemorrhage) ([facility]) 04/15/2021   Personal history of noncompliance with medical treatment, presenting hazards to health 11/16/2016   NSTEMI (non-ST elevated myocardial infarction) ([place]) 12/21/2015   Morbid obesity ([place]) 08/12/2015   Diabetes mellitus without complication ([place]) 0000000   Essential hypertension 08/12/2015    Past Surgical History:  Procedure  Laterality Date   CARDIAC CATHETERIZATION N/A 12/22/2015   Procedure: Left Heart Cath and Coronary Angiography;  Surgeon: Adrian Prows, MD;  Location: [facility];  Service: Cardiovascular;  Laterality: N/A;   CESAREAN SECTION       OB History   No obstetric history on file.     Family History  Problem Relation Age of Onset   Diabetes Mother    Hyperlipidemia Mother    Hypertension Mother    Kidney disease Mother    Colon cancer Father     Social History   Tobacco Use   Smoking status: Never   Smokeless tobacco: Never  Substance Use Topics   Alcohol use: No    Alcohol/week: 0.0 standard drinks   Drug use: No    Home Medications Prior to Admission medications   Medication Sig Start Date End Date Taking? Authorizing Provider  carvedilol (COREG) 6.25 MG tablet Take 6.25 mg by mouth 2 (two) times a day. 12/11/18 01/10/19  [provider]  cholecalciferol (VITAMIN D3) 25 MCG (1000 UT) tablet Take 1,000 Units by mouth daily.    [provider]  furosemide (LASIX) 40 MG tablet Take 1 tablet (40 mg total) by mouth 2 (two) times daily for 5 days. Patient not taking: Reported on 12/26/2018 11/27/18 12/02/18  Scot Jun, FNP  glipiZIDE (GLUCOTROL) 5 MG tablet Take 1 tablet (5 mg total) by mouth daily before breakfast. Patient not taking: Reported on 12/26/2018 11/13/18   Scot Jun, FNP  glucose blood test strip Test 4-5 times a week before eating and 3-4 times a  week 2 hours after eating. Pt uses- one touch verio IQ meter 08/24/15   Henson, Vickie L, PA-C  hydrALAZINE (APRESOLINE) 25 MG tablet Take 25 mg by mouth every 8 (eight) hours. 12/11/18 01/10/19  [provider]  isosorbide mononitrate (IMDUR) 60 MG 24 hr tablet Take 60 mg by mouth daily. 12/11/18   [provider]  losartan (COZAAR) 100 MG tablet Take 1 tablet (100 mg total) by mouth daily. Patient not taking: Reported on 12/26/2018 11/13/18   Scot Jun, FNP  losartan (COZAAR)  25 MG tablet Take 25 mg by mouth daily. 12/11/18 01/10/19  [provider]  ondansetron (ZOFRAN-ODT) 8 MG disintegrating tablet Take 1 tablet (8 mg total) by mouth every 8 (eight) hours as needed for nausea. Patient not taking: Reported on 12/26/2018 12/03/18   Scot Jun, FNP  ONETOUCH DELICA LANCETS FINE MISC TEST 4 TO 5 TMES A WEEK BEFORE EATING AND 3 TO 4 TIMES A WEEK, 2 HOURS AFTER EATING 08/24/15   Henson, Vickie L, PA-C  predniSONE (DELTASONE) 20 MG tablet Take 2 tablets (40 mg total) by mouth daily. 12/26/18   [name], MD  rivaroxaban (XARELTO) 20 MG TABS tablet Take 20 mg by mouth daily with supper. 12/11/18 12/11/19  [provider]  spironolactone (ALDACTONE) 50 MG tablet Take 2 tablets (100 mg total) by mouth daily. Patient not taking: Reported on 12/26/2018 11/27/18   Scot Jun, FNP  torsemide (DEMADEX) 20 MG tablet Take 60 mg by mouth 2 (two) times a day. 12/11/18 01/10/19  [provider]  triamcinolone (KENALOG) 0.025 % ointment Apply 1 application topically 2 (two) times daily. Do not apply to face 12/26/18   [name], MD    Allergies    Latex, Peanut oil, Tomato, Amlodipine, Hydrochlorothiazide w-triamterene, Metoprolol, Oxycodone, and Shellfish-derived products  Review of Systems   Review of Systems  Constitutional:  Negative for activity change, appetite change, chills and fever.  HENT:  Negative for ear pain and sore throat.   Eyes:  Negative for photophobia and visual disturbance.  Respiratory:  Negative for cough and shortness of breath.   Cardiovascular:  Negative for chest pain, palpitations and leg swelling.  Gastrointestinal:  Negative for abdominal pain, nausea and vomiting.  Musculoskeletal:  Negative for neck pain and neck stiffness.  Skin:  Negative for pallor and rash.  Neurological:  Positive for facial asymmetry, speech difficulty, weakness, numbness and headaches. Negative for dizziness, tremors, seizures, syncope and  light-headedness.  Hematological:  Bruises/bleeds easily.  Psychiatric/Behavioral:  Negative for confusion. The patient is not nervous/anxious.   All other systems reviewed and are negative.  Physical Exam Updated Vital Signs BP (!) 142/67   Pulse 82   Temp 98.2 F (36.8 C) (Oral)   Resp (!) 22   Ht 5\' 2"  (1.575 m)   Wt 111 kg   SpO2 97%   BMI 44.76 kg/m   Physical Exam Vitals and nursing note reviewed.  Constitutional:      General: She is not in acute distress.    Appearance: She is well-developed. She is obese. She is not ill-appearing or diaphoretic.  HENT:     Head: Normocephalic and atraumatic.     Jaw: There is normal jaw occlusion.     Right Ear: External ear normal.     Left Ear: External ear normal.     Nose: Nose normal.     Mouth/Throat:     Mouth: Mucous membranes are dry.  Pharynx: Oropharynx is clear.  Eyes:     General: Vision grossly intact. No scleral icterus.    Extraocular Movements: Extraocular movements intact.     Conjunctiva/sclera: Conjunctivae normal.     Pupils: Pupils are equal, round, and reactive to light.     Comments: Pupils 56mm  Neck:     Vascular: No JVD.     Trachea: Trachea and phonation normal.  Cardiovascular:     Rate and Rhythm: Normal rate and regular rhythm.     Pulses: Normal pulses.     Heart sounds: Normal heart sounds. No murmur heard. Pulmonary:     Effort: Pulmonary effort is normal. No respiratory distress.     Breath sounds: Normal breath sounds.  Abdominal:     Palpations: Abdomen is soft.     Tenderness: There is no abdominal tenderness. There is no guarding or rebound.  Musculoskeletal:     Cervical back: Full passive range of motion without pain, normal range of motion and neck supple. No rigidity or tenderness. No spinous process tenderness or muscular tenderness.     Right lower leg: Edema present.     Left lower leg: Edema present.  Lymphadenopathy:     Cervical: No cervical adenopathy.  Skin:     General: Skin is warm and dry.     Capillary Refill: Capillary refill takes less than 2 seconds.     Coloration: Skin is not jaundiced or pale.  Neurological:     Mental Status: She is alert and oriented to person, place, and time.     GCS: GCS eye subscore is 4. GCS verbal subscore is 4. GCS motor subscore is 6.     Cranial Nerves: Cranial nerve deficit, dysarthria and facial asymmetry present.     Sensory: Sensory deficit present.     Motor: Weakness present. No tremor, atrophy, abnormal muscle tone or seizure activity.     Coordination: Coordination is intact. Finger-Nose-Finger Test normal.     Comments: Slurred speech.  Answering questions appropriately, slowed response to some questioning.  Asymmetric left-sided weakness, 3/5 LUE, 4/5 LLE.  5/5 in right-sided extremities.  Left-sided facial droop.  EOM intact.  Psychiatric:        Mood and Affect: Mood normal.        Behavior: Behavior normal. Behavior is cooperative.    ED Results / Procedures / Treatments   Labs (all labs ordered are listed, but only abnormal results are displayed) Labs Reviewed  COMPREHENSIVE METABOLIC PANEL - Abnormal; Notable for the following components:      Result Value   Glucose, Bld 160 (*)    Albumin 3.3 (*)    AST 14 (*)    All other components within normal limits  SODIUM - Abnormal; Notable for the following components:   Sodium 131 (*)    All other components within normal limits  GLUCOSE, CAPILLARY - Abnormal; Notable for the following components:   Glucose-Capillary 157 (*)    All other components within normal limits  GLUCOSE, CAPILLARY - Abnormal; Notable for the following components:   Glucose-Capillary 144 (*)    All other components within normal limits  I-STAT CHEM 8, ED - Abnormal; Notable for the following components:   Glucose, Bld 164 (*)    Calcium, Ion 1.11 (*)    All other components within normal limits  CBG MONITORING, ED - Abnormal; Notable for the following components:    Glucose-Capillary 167 (*)    All other components within normal limits  RESP PANEL BY RT-PCR (FLU A&B, COVID) ARPGX2  MRSA NEXT GEN BY PCR, NASAL  PROTIME-INR  APTT  CBC  DIFFERENTIAL  SODIUM  HIV ANTIBODY (ROUTINE TESTING W REFLEX)  CBC  BASIC METABOLIC PANEL  HEMOGLOBIN A1C  LIPID PANEL  SODIUM  SODIUM  SODIUM  I-STAT BETA HCG BLOOD, ED ([facility], [facility], [facility] ONLY)    EKG None  Radiology CT ANGIO HEAD NECK W WO CM  Result Date: 04/15/2021 CLINICAL DATA:  Left facial droop and left arm weakness EXAM: CT ANGIOGRAPHY HEAD AND NECK TECHNIQUE: Multidetector CT imaging of the head and neck was performed using the standard protocol during bolus administration of intravenous contrast. Multiplanar CT image reconstructions and MIPs were obtained to evaluate the vascular anatomy. Carotid stenosis measurements (when applicable) are obtained utilizing NASCET criteria, using the distal internal carotid diameter as the denominator. CONTRAST:  18mL OMNIPAQUE IOHEXOL 350 MG/ML SOLN COMPARISON:  None. FINDINGS: CTA NECK FINDINGS SKELETON: There is no bony spinal canal stenosis. No lytic or blastic lesion. OTHER NECK: Normal pharynx, larynx and major salivary glands. No cervical lymphadenopathy. Unremarkable thyroid gland. UPPER CHEST: No pneumothorax or pleural effusion. No nodules or masses. AORTIC ARCH: There is no calcific atherosclerosis of the aortic arch. There is no aneurysm, dissection or hemodynamically significant stenosis of the visualized portion of the aorta. Conventional 3 vessel aortic branching pattern. The visualized proximal subclavian arteries are widely patent. RIGHT CAROTID SYSTEM: Normal without aneurysm, dissection or stenosis. LEFT CAROTID SYSTEM: Normal without aneurysm, dissection or stenosis. VERTEBRAL ARTERIES: Left dominant configuration. Both origins are clearly patent. There is no dissection, occlusion or flow-limiting stenosis to the skull base (V1-V3 segments). CTA HEAD FINDINGS  POSTERIOR CIRCULATION: --Vertebral arteries: Normal V4 segments. --Inferior cerebellar arteries: Normal. --Basilar artery: Normal. --Superior cerebellar arteries: Normal. --Posterior cerebral arteries (PCA): Normal. ANTERIOR CIRCULATION: --Intracranial internal carotid arteries: Normal. --Anterior cerebral arteries (ACA): Normal. Both A1 segments are present. Patent anterior communicating artery (a-comm). --Middle cerebral arteries (MCA): Normal. VENOUS SINUSES: As permitted by contrast timing, patent. ANATOMIC VARIANTS: None Review of the MIP images confirms the above findings. IMPRESSION: Normal CTA of the head and neck. Electronically Signed   By: Ulyses Jarred M.D.   On: 04/15/2021 20:46   CT HEAD CODE STROKE WO CONTRAST  Result Date: 04/15/2021 CLINICAL DATA:  Code stroke.  Acute neuro deficit. EXAM: CT HEAD WITHOUT CONTRAST TECHNIQUE: Contiguous axial images were obtained from the base of the skull through the vertex without intravenous contrast. COMPARISON:  CT head 01/23/2021 FINDINGS: Brain: Acute hemorrhage right basal ganglia measuring 3.7 x 14 x 23 mm. This is diffusely high density with mild surrounding edema. Hematoma volume 6 mL of blood. There is mass-effect on the right lateral ventricle with 5 mm midline shift to the left. Patchy hypodensity in the frontal white matter bilaterally similar to the prior study and consistent with chronic microvascular ischemia. Vascular: Negative for hyperdense vessel Skull: Negative Sinuses/Orbits: Retention cyst left maxillary sinus.  Normal orbit Other: None ASPECTS ([place] Stroke Program Early CT Score) Aspects not calculated due to intracranial hemorrhage. IMPRESSION: 1. Acute hemorrhage right basal ganglia likely due to hypertension. Mild surrounding edema. Mass-effect on the right lateral ventricle with 5 mm midline shift to the left. 2. Chronic microvascular ischemic change in the white matter. 3. These results were called by telephone at the time of  interpretation on 04/15/2021 at 3:55 pm to provider JULIE HAVILAND , who verbally acknowledged these results. Electronically Signed   By: Franchot Gallo M.D.  On: 04/15/2021 15:56    Procedures Procedures   Medications Ordered in ED Medications  sodium chloride flush (NS) 0.9 % injection 3 mL (3 mLs Intravenous Not Given 04/15/21 1603)  clevidipine (CLEVIPREX) infusion 0.5 mg/mL (21 mg/hr Intravenous Infusion Verify 04/16/21 0100)  acetaminophen (TYLENOL) tablet 650 mg ( Oral See Alternative 04/16/21 0045)    Or  acetaminophen (TYLENOL) 160 MG/5ML solution 650 mg ( Per Tube See Alternative 04/16/21 0045)    Or  acetaminophen (TYLENOL) suppository 650 mg (650 mg Rectal Given 04/16/21 0045)  senna-docusate (Senokot-S) tablet 1 tablet (1 tablet Oral Not Given 04/15/21 2127)  pantoprazole (PROTONIX) injection 40 mg (40 mg Intravenous Given 04/15/21 2127)  sodium chloride (hypertonic) 3 % solution ( Intravenous Infusion Verify 04/16/21 0100)  Chlorhexidine Gluconate Cloth 2 % PADS 6 each (has no administration in time range)  insulin aspart (novoLOG) injection 0-9 Units (1 Units Subcutaneous Given 04/16/21 0007)  coag fact Xa recombinant (ANDEXXA) low dose infusion 900 mg (0 mg Intravenous Stopped 04/15/21 1824)   stroke: mapping our early stages of recovery book ( Does not apply Given 04/15/21 1723)  iohexol (OMNIPAQUE) 350 MG/ML injection 75 mL (75 mLs Intravenous Contrast Given 04/15/21 2021)    ED Course  I have reviewed the triage vital signs and the nursing notes.  Pertinent labs & imaging results that were available during my care of the patient were reviewed by me and considered in my medical decision making (see chart for details).    MDM Rules/Calculators/A&P                           Eibhlin Gornik is a 50 y.o. female presenting with headache. Initial VS significant for hypertension. Initial neuro exam notable for left facial droop and LUE/LLE weakness. CODE STROKE was called, neurology  present at bedside. Airway clear and patent.  BGL 167.  EKG interpretation: NSR, rate 66 bpm, normal intervals, no ST elevations or depressions, T wave inversion in lead III and V3.  Partial RBBB.  Labs: hCG undetectable.  Hypocalcemia.  COVID/flu negative.  Coags within normal limits.  CBC unremarkable  Imaging: CT code stroke imaging concerning for acute right basal ganglia hemorrhage with edema and mass-effect with 5 mm midline shift.  CTA negative.  Imaging was reviewed by radiology and personally by me.  DDX considered: migraine, cluster headache, tension headache, SAH, SDH, CVA, TIA, head trauma, meningitis, encephalitis, hypertensive emergency, PRES. History, examination, and objective data most consistent with MRI check stroke, likely secondary to hypertension.  No systemic infectious signs or symptoms. No meningismus or rash. Neurologic exam consistent with imaging findings.  No signs of trauma.   Medications: Medications  sodium chloride flush (NS) 0.9 % injection 3 mL (3 mLs Intravenous Not Given 04/15/21 1603)  clevidipine (CLEVIPREX) infusion 0.5 mg/mL (21 mg/hr Intravenous Infusion Verify 04/16/21 0100)  acetaminophen (TYLENOL) tablet 650 mg ( Oral See Alternative 04/16/21 0045)    Or  acetaminophen (TYLENOL) 160 MG/5ML solution 650 mg ( Per Tube See Alternative 04/16/21 0045)    Or  acetaminophen (TYLENOL) suppository 650 mg (650 mg Rectal Given 04/16/21 0045)  senna-docusate (Senokot-S) tablet 1 tablet (1 tablet Oral Not Given 04/15/21 2127)  pantoprazole (PROTONIX) injection 40 mg (40 mg Intravenous Given 04/15/21 2127)  sodium chloride (hypertonic) 3 % solution ( Intravenous Infusion Verify 04/16/21 0100)  Chlorhexidine Gluconate Cloth 2 % PADS 6 each (has no administration in time range)  insulin aspart (novoLOG) injection 0-9  Units (1 Units Subcutaneous Given 04/16/21 0007)  coag fact Xa recombinant (ANDEXXA) low dose infusion 900 mg (0 mg Intravenous Stopped 04/15/21 1824)    stroke: mapping our early stages of recovery book ( Does not apply Given 04/15/21 1723)  iohexol (OMNIPAQUE) 350 MG/ML injection 75 mL (75 mLs Intravenous Contrast Given 04/15/21 2021)    Neurology consulted at bedside immediately.  Eliquis reversed with Andexxa and Cleviprex drip initiated for BP control.  Re-evaluated prior to admission. Hemodynamically stable and in no acute distress. Repeat neuro exam unchanged from prior, continues to protect airway.  Admitted to neuro ICU in critical condition.  Further management per accepting team.  Patient understands and agrees with the plan. Transferred from the ED without issue.    The plan for this patient was discussed with my attending physician, who voiced agreement and who oversaw evaluation and treatment of this patient.     Note: Chief Executive Officer was used in the creation of this note.  Final Clinical Impression(s) / ED Diagnoses Final diagnoses:  Hemorrhagic stroke [facility])    Rx / DC Orders ED Discharge Orders     None        Dwaine Gale, DO 04/16/21 0217    Jacalyn Lefevre, MD 04/16/21 1556

## 2021-04-15 NOTE — H&P (Signed)
Stroke Neurology H&P Note  The history was obtained from the EMS and pt.  During history and examination, all items were able to obtain unless otherwise noted.  History of Present Illness:  Courtney Stewart is a 50 y.o. African American female with PMH of HTN, DM, HLD, CKD, PE on Eliquis, CHF presented to ED for code stroke.  Per EMS, pt started to have HA at 9am. Daughter drove her to see her doctor but at 1pm when they arrived home pt was not able to get out of the car and she was found to have left facial droop and left side weakness with slurry speech. Per pt, she can not remember when the HA started but may be 11am, HA at right parietal area, dull pain, 10/10. She started to have left sided weakness at 1pm. She denies any N/V or LOC. BP with EMS 148/86 and glucose 153. In ER glucose 167 and SBP 136. CT showed right BG ICH about 10cc but there is MLS of 57mm. She received Andexxa reversal and on cleviprex for BP goal < 140.  Per note, she was on [facility] for PE, and recently 02/2021 changed from Xarelto to Eliquis due to experienced forgetfulness that interfered with her daily life. 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   LSN: 1pm tPA Given: No: ICH ICH score = 0  Past Medical History:  Diagnosis Date   Anemia    Diabetes mellitus without complication (HCC)    Hypertension    NSTEMI (non-ST elevated myocardial infarction) (HCC)    UC (ulcerative colitis) (HCC)     Past Surgical History:  Procedure Laterality Date   CARDIAC CATHETERIZATION N/A 12/22/2015   Procedure: Left Heart Cath and Coronary Angiography;  Surgeon: Yates Decamp, MD;  Location: [facility] INVASIVE CV LAB;  Service: Cardiovascular;  Laterality: N/A;   CESAREAN SECTION      Family History  Problem Relation Age of Onset   Diabetes Mother     Hyperlipidemia Mother    Hypertension Mother    Kidney disease Mother    Colon cancer Father     Social History:  reports that she has never smoked. She has never used smokeless tobacco. She reports that she does not drink alcohol and does not use drugs.  Allergies:  Allergies  Allergen Reactions   Latex Shortness Of Breath and Swelling   Peanut Oil Anaphylaxis   Tomato Anaphylaxis   Amlodipine     Possibly diarrhea   Hydrochlorothiazide W-Triamterene     Dehydration   Metoprolol     Side effect decreased energy   Oxycodone Other (See Comments)    Pt does not like to take it   Shellfish-Derived Products     No current facility-administered medications on file prior to encounter.   Current Outpatient Medications on File Prior to Encounter  Medication Sig Dispense Refill   carvedilol (COREG) 6.25 MG tablet Take 6.25 mg by mouth 2 (two) times a day.     cholecalciferol (VITAMIN D3) 25 MCG (1000 UT) tablet Take 1,000 Units by mouth daily.     furosemide (LASIX) 40 MG tablet Take 1 tablet (40 mg total) by mouth 2 (two) times daily for 5 days. (Patient not taking: Reported on 12/26/2018) 10 tablet 0   glipiZIDE (GLUCOTROL) 5 MG tablet Take 1 tablet (  5 mg total) by mouth daily before breakfast. (Patient not taking: Reported on 12/26/2018) 30 tablet 3   glucose blood test strip Test 4-5 times a week before eating and 3-4 times a week 2 hours after eating. Pt uses- one touch verio IQ meter 100 each 2   hydrALAZINE (APRESOLINE) 25 MG tablet Take 25 mg by mouth every 8 (eight) hours.     isosorbide mononitrate (IMDUR) 60 MG 24 hr tablet Take 60 mg by mouth daily.     losartan (COZAAR) 100 MG tablet Take 1 tablet (100 mg total) by mouth daily. (Patient not taking: Reported on 12/26/2018) 90 tablet 3   losartan (COZAAR) 25 MG tablet Take 25 mg by mouth daily.     ondansetron (ZOFRAN-ODT) 8 MG disintegrating tablet Take 1 tablet (8 mg total) by mouth every 8 (eight) hours as needed for  nausea. (Patient not taking: Reported on 12/26/2018) 30 tablet 0   ONETOUCH DELICA LANCETS FINE MISC TEST 4 TO 5 TMES A WEEK BEFORE EATING AND 3 TO 4 TIMES A WEEK, 2 HOURS AFTER EATING 600 each 1   predniSONE (DELTASONE) 20 MG tablet Take 2 tablets (40 mg total) by mouth daily. 10 tablet 0   rivaroxaban (XARELTO) 20 MG TABS tablet Take 20 mg by mouth daily with supper.     spironolactone (ALDACTONE) 50 MG tablet Take 2 tablets (100 mg total) by mouth daily. (Patient not taking: Reported on 12/26/2018) 60 tablet 3   torsemide (DEMADEX) 20 MG tablet Take 60 mg by mouth 2 (two) times a day.     triamcinolone (KENALOG) 0.025 % ointment Apply 1 application topically 2 (two) times daily. Do not apply to face 15 g 1    Review of Systems: A full ROS was attempted today and was able to be performed.  Systems assessed include - Constitutional, Eyes, HENT, Respiratory, Cardiovascular, Gastrointestinal, Genitourinary, Integument/breast, Hematologic/lymphatic, Musculoskeletal, Neurological, Behavioral/Psych, Endocrine, Allergic/Immunologic - with pertinent responses as per HPI.  Physical Examination: BP: ()/()  Arterial Line BP: ()/()   General - well nourished, well developed, in no apparent distress, although complaining of right sided HA.    Ophthalmologic - fundi not visualized due to noncooperation.    Cardiovascular - regular rhythm and rate  Neuro - awake, alert, eyes open, orientated to age, place, time. No aphasia, but paucity of speech, following all simple commands. Able to name and repeat. No gaze palsy, tracking bilaterally, visual field full, PERRL. Left facial droop. Tongue midline. Left UE 4-/5 with drift but not to bed within 10 sec, LLE 3/5 with drift but not drift to bed within 5 sec. Sensation symmetrical bilaterally, right FTN intact, gait not tested.   NIH Stroke Scale  Level Of Consciousness 0=Alert; keenly responsive 1=Arouse to minor stimulation 2=Requires repeated stimulation to  arouse or movements to pain 3=postures or unresponsive 0  LOC Questions to Month and Age 29=Answers both questions correctly 1=Answers one question correctly or dysarthria/intubated/trauma/language barrier 2=Answers neither question correctly or aphasia 0  LOC Commands      -Open/Close eyes     -Open/close grip     -Pantomime commands if communication barrier 0=Performs both tasks correctly 1=Performs one task correctly 2=Performs neighter task correctly 0  Best Gaze     -Only assess horizontal gaze 0=Normal 1=Partial gaze palsy 2=Forced deviation, or total gaze paresis 0  Visual 0=No visual loss 1=Partial hemianopia 2=Complete hemianopia 3=Bilateral hemianopia (blind including cortical blindness) 0  Facial Palsy     -Use grimace if  obtunded 0=Normal symmetrical movement 1=Minor paralysis (asymmetry) 2=Partial paralysis (lower face) 3=Complete paralysis (upper and lower face) 2  Motor  0=No drift for 10/5 seconds 1=Drift, but does not hit bed 2=Some antigravity effort, hits  bed 3=No effort against gravity, limb falls 4=No movement 0=Amputation/joint fusion Right Arm 0     Leg 0    Left Arm 1     Leg 1  Limb Ataxia     - FNT/HTS 0=Absent or does not understand or paralyzed or amputation/joint fusion 1=Present in one limb 2=Present in two limbs 0  Sensory 0=Normal 1=Mild to moderate sensory loss 2=Severe to total sensory loss or coma/unresponsive 0  Best Language 0=No aphasia, normal 1=Mild to moderate aphasia 2=Severe aphasia 3=Mute, global aphasia, or coma/unresponsive 0  Dysarthria 0=Normal 1=Mild to moderate 2=Severe, unintelligible or mute/anarthric 0=intubated/unable to test 1  Extinction/Neglect 0=No abnormality 1=visual/tactile/auditory/spatia/personal inattention/Extinction to bilateral simultaneous stimulation 2=Profound neglect/extinction more than 1 modality  0  Total   5      Data Reviewed: CT HEAD CODE STROKE WO CONTRAST  Result Date:  04/15/2021 CLINICAL DATA:  Code stroke.  Acute neuro deficit. EXAM: CT HEAD WITHOUT CONTRAST TECHNIQUE: Contiguous axial images were obtained from the base of the skull through the vertex without intravenous contrast. COMPARISON:  CT head 01/23/2021 FINDINGS: Brain: Acute hemorrhage right basal ganglia measuring 3.7 x 14 x 23 mm. This is diffusely high density with mild surrounding edema. Hematoma volume 6 mL of blood. There is mass-effect on the right lateral ventricle with 5 mm midline shift to the left. Patchy hypodensity in the frontal white matter bilaterally similar to the prior study and consistent with chronic microvascular ischemia. Vascular: Negative for hyperdense vessel Skull: Negative Sinuses/Orbits: Retention cyst left maxillary sinus.  Normal orbit Other: None ASPECTS ([place] Stroke Program Early CT Score) Aspects not calculated due to intracranial hemorrhage. IMPRESSION: 1. Acute hemorrhage right basal ganglia likely due to hypertension. Mild surrounding edema. Mass-effect on the right lateral ventricle with 5 mm midline shift to the left. 2. Chronic microvascular ischemic change in the white matter. 3. These results were called by telephone at the time of interpretation on 04/15/2021 at 3:55 pm to provider JULIE HAVILAND , who verbally acknowledged these results. Electronically Signed   By: Franchot Gallo M.D.   On: 04/15/2021 15:56     Assessment: 50 y.o. female with PMH of HTN, DM, HLD, CKD, PE on Eliquis, CHF presented to ED for HA, left facial droop and left side weakness with slurry speech. Time onset 1pm, NIHSS = 5. CT showed right BG ICH about 10cc but there is MLS of 16mm. She received Andexxa reversal and on cleviprex for BP goal < 140. Will also started on 3% saline for MLS. Repeat CT and do CTA head and neck in 4-6 hours. Admit to ICU.  She is on eliquis for PE and heterozygous prothrombin gene mutation which increases chance of DVT by 2-3x. She stated that she takes eliquis only once  a day and last dose was yesterday 9am.  Plan: ICU admission for close vital sign monitoring and neuro neck Repeat CT and do CTA head and neck at 8pm Stat CT head if acute neuro changes Blood pressure management - Target SBP <140 - on cleviprex drip  Reversal of eliquis with Andexxa Head of bed elevation > 30 degrees if not contraindicated Put on 3% saline for cerebral edema with MLS, Na goal 150-155 Discussed with EDP  This patient is critically ill due to right  BG ICH, hypertensive emergency, on eliquis with reversal, cerebral edema and at significant risk of neurological worsening, death form brain herniation, hematoma expansion, seizure. This patient's care requires constant monitoring of vital signs, hemodynamics, respiratory and cardiac monitoring, review of multiple databases, neurological assessment, discussion with family, other specialists and medical decision making of high complexity. I spent 50 minutes of neurocritical care time in the care of this patient.  Rosalin Hawking, MD PhD Stroke Neurology 04/15/2021 5:09 PM

## 2021-04-15 NOTE — ED Triage Notes (Signed)
Pt BIB [facility] for CODE STROKE. Pt had a HA starting at 0900 today. Went to PCP to get seen, got home at 1300 and daughter noticed R sided facial droop, L sided weakness, and slurred speech. HA 10/10 to R side of head. Takes eliquis, last dose 0900 yesterday AM. 18g L AC by EMS.   EMS VS- CBG 157, 148/56, temp 98.2, 100% RA, HR 69

## 2021-04-15 NOTE — Code Documentation (Addendum)
Stroke Response Nurse Documentation Code Documentation  Courtney Stewart is a 50 y.o. female arriving to [facility] ED via [facility] on 04/15/2021 with past medical hx of HTN, DM, HLD, CKD, PE on Eliquis, CHF. On Eliquis (apixaban) daily. Code stroke was activated by EMS.   Patient from home where she was LKW at 0900 and now complaining of 10/10 right sided headache, left facial droop. Pt had onset of headache at 0900 this morning. She went to a PCP appointment and when she returned home at 1300 her daughter noted her to have a left facial droop.   Stroke team at the bedside on patient arrival. Labs drawn and patient cleared for CT by Dr. Irene Limbo. Patient to CT with team. NIHSS 6, see documentation for details and code stroke times. Patient with left facial droop, left arm weakness, left leg weakness, left limb ataxia, and left decreased sensation on exam. The following imaging was completed: CT. Patient is not a candidate for IV Thrombolytic due to ICH. Patient is not a candidate for IR.   Care/Plan: q1h NIHSS, VS and pupils. SBP <140 (more frequent VS checks if managing BP).  Difficulty with establishing IV access. IV team consult placed.   Bedside handoff with ED RN Camryn.    Courtney Stewart L Courtney Stewart  Rapid Response RN

## 2021-04-16 ENCOUNTER — Inpatient Hospital Stay (HOSPITAL_COMMUNITY): Payer: Self-pay

## 2021-04-16 DIAGNOSIS — I6389 Other cerebral infarction: Secondary | ICD-10-CM

## 2021-04-16 LAB — RAPID URINE DRUG SCREEN, HOSP PERFORMED
Amphetamines: NOT DETECTED
Barbiturates: NOT DETECTED
Benzodiazepines: NOT DETECTED
Cocaine: NOT DETECTED
Opiates: NOT DETECTED
Tetrahydrocannabinol: NOT DETECTED

## 2021-04-16 LAB — LIPID PANEL
Cholesterol: 203 mg/dL — ABNORMAL HIGH (ref 0–200)
HDL: 33 mg/dL — ABNORMAL LOW (ref >40)
LDL Cholesterol: 123 mg/dL — ABNORMAL HIGH (ref 0–99)
Total CHOL/HDL Ratio: 6.2 RATIO
Triglycerides: 234 mg/dL — ABNORMAL HIGH (ref <150)
VLDL: 47 mg/dL — ABNORMAL HIGH (ref 0–40)

## 2021-04-16 LAB — BASIC METABOLIC PANEL
Anion gap: 7 (ref 5–15)
BUN: 7 mg/dL (ref 6–20)
CO2: 24 mmol/L (ref 22–32)
Calcium: 9.1 mg/dL (ref 8.9–10.3)
Chloride: 105 mmol/L (ref 98–111)
Creatinine, Ser: 0.76 mg/dL (ref 0.44–1.00)
GFR, Estimated: >60 mL/min (ref >60)
Glucose, Bld: 148 mg/dL — ABNORMAL HIGH (ref 70–99)
Potassium: 4.2 mmol/L (ref 3.5–5.1)
Sodium: 136 mmol/L (ref 135–145)

## 2021-04-16 LAB — ECHOCARDIOGRAM COMPLETE
AR max vel: 1.85 cm2
AV Area VTI: 1.86 cm2
AV Area mean vel: 1.95 cm2
AV Mean grad: 7.6 mmHg
AV Peak grad: 17.3 mmHg
Ao pk vel: 2.08 m/s
Area-P 1/2: 5.02 cm2
Height: 62 in
S' Lateral: 3.7 cm
Weight: 3915.37 oz

## 2021-04-16 LAB — SODIUM
Sodium: 139 mmol/L (ref 135–145)
Sodium: 137 mmol/L (ref 135–145)

## 2021-04-16 LAB — CBC
HCT: 41.8 % (ref 36.0–46.0)
Hemoglobin: 13.6 g/dL (ref 12.0–15.0)
MCH: 28.7 pg (ref 26.0–34.0)
MCHC: 32.5 g/dL (ref 30.0–36.0)
MCV: 88.2 fL (ref 80.0–100.0)
Platelets: 351 10*3/uL (ref 150–400)
RBC: 4.74 MIL/uL (ref 3.87–5.11)
RDW: 13.2 % (ref 11.5–15.5)
WBC: 10.2 10*3/uL (ref 4.0–10.5)
nRBC: 0 % (ref 0.0–0.2)

## 2021-04-16 LAB — GLUCOSE, CAPILLARY
Glucose-Capillary: 123 mg/dL — ABNORMAL HIGH (ref 70–99)
Glucose-Capillary: 133 mg/dL — ABNORMAL HIGH (ref 70–99)
Glucose-Capillary: 212 mg/dL — ABNORMAL HIGH (ref 70–99)
Glucose-Capillary: 188 mg/dL — ABNORMAL HIGH (ref 70–99)

## 2021-04-16 LAB — HEMOGLOBIN A1C
Hgb A1c MFr Bld: 7.7 % — ABNORMAL HIGH (ref 4.8–5.6)
Mean Plasma Glucose: 174.29 mg/dL

## 2021-04-16 MED ORDER — VITAMIN D 25 MCG (1000 UNIT) PO TABS
1000.0000 [IU] | ORAL_TABLET | Freq: Every day | ORAL | Status: DC
  Administered 2021-04-16 – 2021-04-19 (×4): 1000 [IU] via ORAL
  Filled 2021-04-16 (×4): qty 1

## 2021-04-16 MED ORDER — INSULIN ASPART 100 UNIT/ML IJ SOLN
0.0000 [IU] | Freq: Three times a day (TID) | INTRAMUSCULAR | Status: DC
  Administered 2021-04-16: 1 [IU] via SUBCUTANEOUS
  Administered 2021-04-16: 3 [IU] via SUBCUTANEOUS
  Administered 2021-04-17: 1 [IU] via SUBCUTANEOUS
  Administered 2021-04-17: 2 [IU] via SUBCUTANEOUS
  Administered 2021-04-18: 1 [IU] via SUBCUTANEOUS
  Administered 2021-04-18: 2 [IU] via SUBCUTANEOUS
  Administered 2021-04-18 – 2021-04-19 (×2): 1 [IU] via SUBCUTANEOUS
  Administered 2021-04-19: 2 [IU] via SUBCUTANEOUS

## 2021-04-16 MED ORDER — LOSARTAN POTASSIUM 50 MG PO TABS
25.0000 mg | ORAL_TABLET | Freq: Every day | ORAL | Status: DC
  Administered 2021-04-16 – 2021-04-19 (×4): 25 mg via ORAL
  Filled 2021-04-16 (×4): qty 1

## 2021-04-16 MED ORDER — HYDRALAZINE HCL 25 MG PO TABS
25.0000 mg | ORAL_TABLET | Freq: Three times a day (TID) | ORAL | Status: DC
  Administered 2021-04-16 – 2021-04-17 (×3): 25 mg via ORAL
  Filled 2021-04-16 (×3): qty 1

## 2021-04-16 MED ORDER — CARVEDILOL 3.125 MG PO TABS
6.2500 mg | ORAL_TABLET | Freq: Two times a day (BID) | ORAL | Status: DC
  Administered 2021-04-16 – 2021-04-19 (×6): 6.25 mg via ORAL
  Filled 2021-04-16 (×7): qty 2

## 2021-04-16 MED ORDER — TRIAMCINOLONE ACETONIDE 0.5 % EX OINT
1.0000 "application " | TOPICAL_OINTMENT | Freq: Two times a day (BID) | CUTANEOUS | Status: DC
  Administered 2021-04-16 – 2021-04-18 (×5): 1 via TOPICAL
  Filled 2021-04-16: qty 15

## 2021-04-16 NOTE — Progress Notes (Addendum)
STROKE TEAM PROGRESS NOTE   INTERVAL HISTORY Patient is seen in her room with her RN at the bedside.  She was admitted yesterday with a headache, left sided weakness and left facial droop with slurring of speech.  On arrival to the ED, she was found to have a right BG ICH with midline shift.  Her home Eliquis was reversed with Andexxa, she was admitted to the ICU and hypertonic saline was started.CTA completed yesterday.  She feels better more alert/awake. Still have headache but it is improved. Tylenol given this am. No double vision.   Vitals:   04/16/21 0930 04/16/21 0945 04/16/21 0958 04/16/21 1000  BP: 139/77  (!) 144/78   Pulse: 77 80  77  Resp: 19 (!) 24  16  Temp:      TempSrc:      SpO2: 96% 95%  97%  Weight:      Height:       CBC:  Recent Labs  Lab 04/15/21 1638 04/16/21 0530  WBC 7.9 10.2  NEUTROABS 5.3  --   HGB 12.5 13.6  HCT 38.8 41.8  MCV 90.9 88.2  PLT 306 351   Basic Metabolic Panel:  Recent Labs  Lab 04/15/21 1638 04/15/21 2220 04/16/21 0530  NA 137 131* 136  K 4.0  --  4.2  CL 106  --  105  CO2 24  --  24  GLUCOSE 160*  --  148*  BUN 11  --  7  CREATININE 0.86  --  0.76  CALCIUM 9.0  --  9.1   Lipid Panel:  Recent Labs  Lab 04/16/21 0530  CHOL 203*  TRIG 234*  HDL 33*  CHOLHDL 6.2  VLDL 47*  LDLCALC 123*   HgbA1c:  Recent Labs  Lab 04/16/21 0530  HGBA1C 7.7*   Urine Drug Screen: No results for input(s): LABOPIA, COCAINSCRNUR, LABBENZ, AMPHETMU, THCU, LABBARB in the last 168 hours.  Alcohol Level No results for input(s): ETH in the last 168 hours.  IMAGING past 24 hours CT ANGIO HEAD NECK W WO CM  Result Date: 04/15/2021 CLINICAL DATA:  Left facial droop and left arm weakness EXAM: CT ANGIOGRAPHY HEAD AND NECK TECHNIQUE: Multidetector CT imaging of the head and neck was performed using the standard protocol during bolus administration of intravenous contrast. Multiplanar CT image reconstructions and MIPs were obtained to evaluate  the vascular anatomy. Carotid stenosis measurements (when applicable) are obtained utilizing NASCET criteria, using the distal internal carotid diameter as the denominator. CONTRAST:  27mL OMNIPAQUE IOHEXOL 350 MG/ML SOLN COMPARISON:  None. FINDINGS: CTA NECK FINDINGS SKELETON: There is no bony spinal canal stenosis. No lytic or blastic lesion. OTHER NECK: Normal pharynx, larynx and major salivary glands. No cervical lymphadenopathy. Unremarkable thyroid gland. UPPER CHEST: No pneumothorax or pleural effusion. No nodules or masses. AORTIC ARCH: There is no calcific atherosclerosis of the aortic arch. There is no aneurysm, dissection or hemodynamically significant stenosis of the visualized portion of the aorta. Conventional 3 vessel aortic branching pattern. The visualized proximal subclavian arteries are widely patent. RIGHT CAROTID SYSTEM: Normal without aneurysm, dissection or stenosis. LEFT CAROTID SYSTEM: Normal without aneurysm, dissection or stenosis. VERTEBRAL ARTERIES: Left dominant configuration. Both origins are clearly patent. There is no dissection, occlusion or flow-limiting stenosis to the skull base (V1-V3 segments). CTA HEAD FINDINGS POSTERIOR CIRCULATION: --Vertebral arteries: Normal V4 segments. --Inferior cerebellar arteries: Normal. --Basilar artery: Normal. --Superior cerebellar arteries: Normal. --Posterior cerebral arteries (PCA): Normal. ANTERIOR CIRCULATION: --Intracranial internal carotid arteries: Normal. --  Anterior cerebral arteries (ACA): Normal. Both A1 segments are present. Patent anterior communicating artery (a-comm). --Middle cerebral arteries (MCA): Normal. VENOUS SINUSES: As permitted by contrast timing, patent. ANATOMIC VARIANTS: None Review of the MIP images confirms the above findings. IMPRESSION: Normal CTA of the head and neck. Electronically Signed   By: Deatra Robinson M.D.   On: 04/15/2021 20:46   CT HEAD CODE STROKE WO CONTRAST  Result Date: 04/15/2021 CLINICAL DATA:   Code stroke.  Acute neuro deficit. EXAM: CT HEAD WITHOUT CONTRAST TECHNIQUE: Contiguous axial images were obtained from the base of the skull through the vertex without intravenous contrast. COMPARISON:  CT head 01/23/2021 FINDINGS: Brain: Acute hemorrhage right basal ganglia measuring 3.7 x 14 x 23 mm. This is diffusely high density with mild surrounding edema. Hematoma volume 6 mL of blood. There is mass-effect on the right lateral ventricle with 5 mm midline shift to the left. Patchy hypodensity in the frontal white matter bilaterally similar to the prior study and consistent with chronic microvascular ischemia. Vascular: Negative for hyperdense vessel Skull: Negative Sinuses/Orbits: Retention cyst left maxillary sinus.  Normal orbit Other: None ASPECTS ([place] Stroke Program Early CT Score) Aspects not calculated due to intracranial hemorrhage. IMPRESSION: 1. Acute hemorrhage right basal ganglia likely due to hypertension. Mild surrounding edema. Mass-effect on the right lateral ventricle with 5 mm midline shift to the left. 2. Chronic microvascular ischemic change in the white matter. 3. These results were called by telephone at the time of interpretation on 04/15/2021 at 3:55 pm to provider JULIE HAVILAND , who verbally acknowledged these results. Electronically Signed   By: Marlan Palau M.D.   On: 04/15/2021 15:56    PHYSICAL EXAM General: Patient is an alert, well-nourished female in no acute distress.  General - well nourished, well developed, in no apparent distress. Sleepy but easily awakens and interactive.   Ext: No C/C/E. Skin: dry skin in b/l LE and wrist/hands.    Neuro - awake, alert, eyes open, orientated to age, place, time. No aphasia. Will answers questions/communicate. following commands. Able to name and repeat. No gaze palsy, tracking bilaterally, visual field full, PERRL. Left facial droop. Tongue midline.  Motor: 4/5 left grip. Slight drift in left leg. .No drift on  right. Sensation symmetrical bilaterally, right FTN intact, gait not tested.    ASSESSMENT/PLAN Ms. Keyira Mondesir is a 50 y.o. 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 She was found to have a right basal ganglia ICH and was admitted to the ICU and started on hypertonic saline.   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  Will repeat noncontrast head CT at 1500 today to evaluate for expansion of ICH, CTA head & neck No acute abnormalities 2D Echo EF 60-65% with normal atrial septum LDL 123 HgbA1c 7.7 VTE prophylaxis - SCDs    Diet   Diet clear liquid Room service appropriate? Yes with Assist; Fluid consistency: Thin   Eliquis (apixaban) daily prior to admission, now on No antithrombotic secondary to ICH Therapy recommendations:  pending Disposition:  pending  Hypertension Home meds:  Coreg 6.25  mg daily, hydralazine 25 mg q8h, losartan 25 mg daily, continued in hospital Unstable Kepp SBP < 140 at this time, may liberalize to < 160 tomorrow Long-term BP goal normotensive  Hyperlipidemia Home meds:  none, LDL 123, goal < 70 High intensity statin to be started at discharge Continue statin at discharge  Diabetes type II Uncontrolled Home meds:  Invokana 300 mg daily, Glipzide 5 mg daily HgbA1c 7.7, goal < 7.0 CBGs Recent Labs    04/15/21 1706 04/15/21 2311 04/16/21 0736  GLUCAP 157* 144* 123*   Diabetes coordinator consult SSI  Other Stroke Risk Factors Obesity, Body mass index is 44.76 kg/m., BMI >/= 30 associated with increased stroke risk,  recommend weight loss, diet and exercise as appropriate  Congestive heart failure  Other Active Problems CKD Monitor daily BUN/Cr Avoid contrast when possible Renally dose medications as appropriate  History of PE and prothrombin gene mutation Was taking Eliquis indefinitely for prevention of DVT/PE Will discuss resumption of home anticoagulation when ICH has resolved  Hospital day # 1  Cortney E Ernestina Columbia, NP   ATTENDING ATTESTATION:  S/p right [facility] ICH with edema and shift. On hypertonic saline but Na not at goal. Will increase per protocol. Follow Na levels. Repeat CT later today reviewed. Official read is pending but looks slightly improved from last scan.   Dr. Viviann Spare evaluated pt independently, reviewed imaging, chart, labs. Discussed and formulated plan with the APP. Please see APP note above for details.      This patient is critically ill due to respiratory distress, stroke, herniation from edema and at significant risk of neurological worsening, death form heart failure, respiratory failure, recurrent stroke, bleeding from [facility], seizure, sepsis. This patient's care requires constant monitoring of vital signs, hemodynamics, respiratory and cardiac monitoring, review of multiple databases, neurological assessment, discussion with family, other specialists and medical decision making of high complexity. I spent 35 minutes of neurocritical care time in the care of this patient.   Shoshanah Dapper,MD   To contact Stroke Continuity provider, please refer to [email or web address]. After hours, contact General Neurology

## 2021-04-16 NOTE — Progress Notes (Addendum)
Physical Therapy Evaluation Patient Details Name: Courtney Stewart MRN: [id number] DOB: Sep 16, 1970 Today's Date: 04/16/2021  History of Present Illness  50 y.o. female admitted on 04/15/21 forHA and L sided weakness, slurred speech.  CT showed R BG ICH.  Pt with significant PMH of anemia, DM, HTN, NSTEMI, ulcerative colitis.  Clinical Impression  Pt was able to get up OOB to chair with one person min assist overall, but given borderline BPs and moderate HA I did not push for ambulation today, will re attempt tomorrow.   PT to follow acutely for deficits listed below.        Recommendations for follow up therapy are one component of a multi-disciplinary discharge planning process, led by the attending physician.  Recommendations may be updated based on patient status, additional functional criteria and insurance authorization.  Follow Up Recommendations Home health PT    Assistance Recommended at Discharge Intermittent Supervision/Assistance  Functional Status Assessment Patient has had a recent decline in their functional status and demonstrates the ability to make significant improvements in function in a reasonable and predictable amount of time.  Equipment Recommendations  Rolling walker (2 wheels);3in1 (PT)    Recommendations for Other Services       Precautions / Restrictions Precautions Precautions: Fall Precaution Comments: very mildly weak on left, BP goal <140      Mobility  Bed Mobility Overal bed mobility: Needs Assistance Bed Mobility: Supine to Sit     Supine to sit: Min assist;HOB elevated     General bed mobility comments: Min hand held assist to pull up to sitting from supine.  Pt used railing with free hand.    Transfers Overall transfer level: Needs assistance Equipment used: None Transfers: Sit to/from [employer] Sit to Stand: Min assist Stand pivot transfers: Min assist         General transfer comment: Min assist for balance, pt  relying on UE support on bed rail and armrests of chair during transitions.    Ambulation/Gait             General Gait Details: NT today given moderate HA pain.  Stairs            Wheelchair Mobility    Modified Rankin (Stroke Patients Only) Modified Rankin (Stroke Patients Only) Pre-Morbid Rankin Score: No symptoms Modified Rankin: Moderately severe disability     Balance Overall balance assessment: Mild deficits observed, not formally tested                                           Pertinent Vitals/Pain Pain Assessment: Faces Faces Pain Scale: Hurts even more Pain Location: head Pain Descriptors / Indicators: Aching Pain Intervention(s): Monitored during session;Limited activity within patient's tolerance;Repositioned;Premedicated before session    Home Living Family/patient expects to be discharged to:: Private residence Living Arrangements: Children (daughter 73 y.o.) Available Help at Discharge: Available PRN/intermittently (daughter works) Type of Home: Apartment Home Access: Level entry       Home Layout: One level Home Equipment: None      Prior Function Prior Level of Function : Independent/Modified Independent             Mobility Comments: works as a Arboriculturist, drives, completely independent PTA.       Hand Dominance   Dominant Hand: Right    Extremity/Trunk Assessment   Upper Extremity Assessment Upper Extremity Assessment: Defer to  OT evaluation    Lower Extremity Assessment Lower Extremity Assessment: LLE deficits/detail LLE Deficits / Details: left leg mildly weaker than right leg however, both grossly 4/5 when tested    Cervical / Trunk Assessment Cervical / Trunk Assessment: Normal  Communication   Communication: No difficulties  Cognition Arousal/Alertness: Awake/alert Behavior During Therapy: Flat affect Overall Cognitive Status: Impaired/Different from baseline Area of Impairment: Problem  solving                             Problem Solving: Slow processing General Comments: Pt seems a bit slow to process, but followed commands well given extra time, also just woke from sleeping.        General Comments General comments (skin integrity, edema, etc.): Educated on attempting to keep HA pain in check by limiting noxious stimuli/over stimulation (phone, TV, loud noise, bright light) without being completely in the dark and sleeping all of the time.    Exercises     Assessment/Plan    PT Assessment Patient needs continued PT services  PT Problem List Decreased strength;Decreased balance;Decreased activity tolerance;Decreased mobility;Decreased coordination;Decreased cognition;Decreased knowledge of use of DME;Decreased safety awareness;Obesity;Cardiopulmonary status limiting activity;Pain       PT Treatment Interventions DME instruction;Gait training;Functional mobility training;Therapeutic activities;Therapeutic exercise;Balance training;Neuromuscular re-education;Cognitive remediation;Patient/family education    PT Goals (Current goals can be found in the Care Plan section)  Acute Rehab PT Goals Patient Stated Goal: to decrease HA pain PT Goal Formulation: With patient Time For Goal Achievement: 04/30/21 Potential to Achieve Goals: Good    Frequency Min 4X/week   Barriers to discharge        Co-evaluation               AM-PAC PT "6 Clicks" Mobility  Outcome Measure Help needed turning from your back to your side while in a flat bed without using bedrails?: A Little Help needed moving from lying on your back to sitting on the side of a flat bed without using bedrails?: A Little Help needed moving to and from a bed to a chair (including a wheelchair)?: A Little Help needed standing up from a chair using your arms (e.g., wheelchair or bedside chair)?: A Little Help needed to walk in hospital room?: Total Help needed climbing 3-5 steps with a  railing? : Total 6 Click Score: 14    End of Session   Activity Tolerance: Patient limited by pain Patient left: in chair;with call bell/phone within reach;with chair alarm set   PT Visit Diagnosis: Muscle weakness (generalized) (M62.81);Difficulty in walking, not elsewhere classified (R26.2);Hemiplegia and hemiparesis Hemiplegia - Right/Left: Left Hemiplegia - dominant/non-dominant: Non-dominant Hemiplegia - caused by: Nontraumatic intracerebral hemorrhage    Time: [id number] PT Time Calculation (min) (ACUTE ONLY): 25 min   Charges:   PT Evaluation $PT Eval Moderate Complexity: 1 Mod PT Treatments $Therapeutic Activity: 8-22 mins       Verdene Lennert, PT, DPT  Acute Rehabilitation Ortho Tech Supervisor [phone] pager [phone]) [phone] office

## 2021-04-16 NOTE — Evaluation (Signed)
Speech Language Pathology Evaluation Patient Details Name: Courtney Stewart MRN: [id number] DOB: Oct 14, 1970 Today's Date: 04/16/2021 Time: 7829-5621 SLP Time Calculation (min) (ACUTE ONLY): 15 min  Problem List:  Patient Active Problem List   Diagnosis Date Noted   ICH (intracerebral hemorrhage) (HCC) 04/15/2021   Personal history of noncompliance with medical treatment, presenting hazards to health 11/16/2016   NSTEMI (non-ST elevated myocardial infarction) (HCC) 12/21/2015   Morbid obesity (HCC) 08/12/2015   Diabetes mellitus without complication (HCC) 08/12/2015   Essential hypertension 08/12/2015   Past Medical History:  Past Medical History:  Diagnosis Date   Anemia    Diabetes mellitus without complication (HCC)    Hypertension    NSTEMI (non-ST elevated myocardial infarction) (HCC)    UC (ulcerative colitis) (HCC)    Past Surgical History:  Past Surgical History:  Procedure Laterality Date   CARDIAC CATHETERIZATION N/A 12/22/2015   Procedure: Left Heart Cath and Coronary Angiography;  Surgeon: Yates Decamp, MD;  Location: [facility] INVASIVE CV LAB;  Service: Cardiovascular;  Laterality: N/A;   CESAREAN SECTION     HPI:  Per EMS, pt started to have HA at 9am. Daughter drove her to see her doctor but at 1pm when they arrived home pt was not able to get out of the car and she was found to have left facial droop and left side weakness with slurry speech.  She was found to have an ICH.  CT of the head was showing acute hemorrhagic right basal ganglia with mild surrounding edema, mass effect on the right lateral ventricle with 68mm midline shift to left as well as chronic microvascular ischemic changes in the white matter.  Patient is from home living with her daughter.  She works full time.   Assessment / Plan / Recommendation Clinical Impression  Cognitive/linguistic evaluation and motor speech screen were completed.   Cranial nerve exam was completed and unremarkable. Lingual, labial,  facial and jaw range of motion and strength appeared adequate.  Facial sensation was intact and she did not endorse a difference in sensation from the right to left side of her face.   Speech was clear and easy to understand.  No discernible dysarthria or apraxia were noted.  She achieved an overall score of 27/30 on the Mini Mental State Exam suggesting functional cognitive/linguistic skills.  She was oriented to person, place, time and situation.  She had good attention to task.  Immediate and delayed recall were adequate for recall of three novel words.  Language skills appeared to be grossly intact.  She was able to name objects, repeat a short sentence, follow a 3 step command, read/comprehend a short sentence and write a sentence.  Speech was fluent and she was able to describe reason for her admission.  Mild diffiuclty was noted for copying a design.  Finally she was able to complete the clock drawing task adequately and provide logical solutions to simple problems.  Given results of this evaluation ST follow up is not indicated.  If we can be of further assitance please feel free to reconsult.    SLP Assessment  SLP Visit Diagnosis: Cognitive communication deficit (R41.841)    Recommendations for follow up therapy are one component of a multi-disciplinary discharge planning process, led by the attending physician.  Recommendations may be updated based on patient status, additional functional criteria and insurance authorization.    Follow Up Recommendations  None          SLP Evaluation Cognition  Overall Cognitive Status: Within  Functional Limits for tasks assessed Arousal/Alertness: Awake/alert Orientation Level: Oriented X4 Attention: Sustained Sustained Attention: Appears intact Memory: Appears intact Problem Solving: Appears intact       Comprehension  Auditory Comprehension Overall Auditory Comprehension: Appears within functional limits for tasks assessed Yes/No Questions:  Not tested Commands: Within Functional Limits Conversation: Simple Reading Comprehension Reading Status: Within funtional limits    Expression Expression Primary Mode of Expression: Verbal Verbal Expression Overall Verbal Expression: Appears within functional limits for tasks assessed Initiation: No impairment Automatic Speech: Name;Social Response Level of Generative/Spontaneous Verbalization: Sentence;Conversation Repetition: No impairment Naming: No impairment Pragmatics: No impairment Non-Verbal Means of Communication: Not applicable Written Expression Dominant Hand: Right Written Expression: Within Functional Limits   Oral / Motor  Oral Motor/Sensory Function Overall Oral Motor/Sensory Function: Within functional limits Motor Speech Overall Motor Speech: Appears within functional limits for tasks assessed Respiration: Within functional limits Phonation: Normal Resonance: Within functional limits Articulation: Within functional limitis Intelligibility: Intelligible Motor Planning: Witnin functional limits Motor Speech Errors: Not applicable   GO                   Shelly Flatten, MA, CCC-SLP Acute Rehab SLP [phone]' Lamar Sprinkles 04/16/2021, 9:01 AM

## 2021-04-16 NOTE — Evaluation (Signed)
Clinical/Bedside Swallow Evaluation Patient Details  Name: Courtney Stewart MRN: [id number] Date of Birth: 1970-06-16  Today's Date: 04/16/2021 Time: SLP Start Time (ACUTE ONLY): 0815 SLP Stop Time (ACUTE ONLY): 0830 SLP Time Calculation (min) (ACUTE ONLY): 15 min  Past Medical History:  Past Medical History:  Diagnosis Date   Anemia    Diabetes mellitus without complication (HCC)    Hypertension    NSTEMI (non-ST elevated myocardial infarction) (HCC)    UC (ulcerative colitis) (HCC)    Past Surgical History:  Past Surgical History:  Procedure Laterality Date   CARDIAC CATHETERIZATION N/A 12/22/2015   Procedure: Left Heart Cath and Coronary Angiography;  Surgeon: Yates Decamp, MD;  Location: [facility] INVASIVE CV LAB;  Service: Cardiovascular;  Laterality: N/A;   CESAREAN SECTION     HPI:  Per EMS, pt started to have HA at 9am. Daughter drove her to see her doctor but at 1pm when they arrived home pt was not able to get out of the car and she was found to have left facial droop and left side weakness with slurry speech.  She was found to have an ICH.  CT of the head was showing acute hemorrhagic right basal ganglia with mild surrounding edema, mass effect on the right lateral ventricle with 75mm midline shift to left as well as chronic microvascular ischemic changes in the white matter.  Patient is from home living with her daughter.  She works full time.    Assessment / Plan / Recommendation  Clinical Impression  Clinical swallowing evaluation was completed in setting of admission due to ICH.  The patient did no endorse any issues swallowing prior to admission stating she ate a regular diet.  Cranial nerve exam was completed and unremarkable. Lingual, labial, facial and jaw range of motion and strength appeared adequate.  Facial sensation was intact and she did not endorse a difference in sensation from the right to left side of her face.  She was presented with thin liquids via spoon, cup and straw,  pureed material and dry solids.  Her oral and pharyngeal swallow appeared to be functional.  Mastication of dry solids was adequate with good oral clearance.  Swallow trigger was appreciated to palpation and overt s/s of aspiration were not seen.  In addition, she passed the [facility] Swallowing Protocol.  Suggest a regular textured diet with thin liquids.  ST follow up is not indicated.  If we can be of further assistance please feel free to reconsult. SLP Visit Diagnosis: Dysphagia, unspecified (R13.10)    Aspiration Risk  No limitations    Diet Recommendation   Regular with thin liquids  Medication Administration: Whole meds with liquid    Other  Recommendations Oral Care Recommendations: Oral care BID    Recommendations for follow up therapy are one component of a multi-disciplinary discharge planning process, led by the attending physician.  Recommendations may be updated based on patient status, additional functional criteria and insurance authorization.  Follow up Recommendations None        Swallow Study   General Date of Onset: 04/15/21 HPI: Per EMS, pt started to have HA at 9am. Daughter drove her to see her doctor but at 1pm when they arrived home pt was not able to get out of the car and she was found to have left facial droop and left side weakness with slurry speech.  She was found to have an ICH.  CT of the head was showing acute hemorrhagic right basal ganglia with mild  surrounding edema, mass effect on the right lateral ventricle with 90mm midline shift to left as well as chronic microvascular ischemic changes in the white matter.  Patient is from home living with her daughter.  She works full time. Type of Study: Bedside Swallow Evaluation Previous Swallow Assessment: None noted at [facility]. Diet Prior to this Study: NPO Temperature Spikes Noted: No Respiratory Status: Room air History of Recent Intubation: No Behavior/Cognition: Alert;Cooperative Oral Cavity Assessment: Within  Functional Limits Oral Care Completed by SLP: No Oral Cavity - Dentition: Adequate natural dentition Vision: Functional for self-feeding Self-Feeding Abilities: Able to feed self Patient Positioning: Upright in bed Baseline Vocal Quality: Normal Volitional Swallow: Able to elicit    Oral/Motor/Sensory Function Overall Oral Motor/Sensory Function: Within functional limits   Ice Chips Ice chips: Not tested   Thin Liquid Thin Liquid: Within functional limits Presentation: Spoon;Cup;Straw;Self Fed    Nectar Thick Nectar Thick Liquid: Not tested   Honey Thick Honey Thick Liquid: Not tested   Puree Puree: Within functional limits Presentation: Spoon   Solid     Solid: Within functional limits Presentation: [place], [place], [place] Acute Rehab SLP [phone]  Lamar Sprinkles 04/16/2021,8:52 AM

## 2021-04-16 NOTE — Progress Notes (Signed)
  Echocardiogram 2D Echocardiogram has been performed.  Delcie Roch 04/16/2021, 11:42 AM

## 2021-04-16 NOTE — Progress Notes (Signed)
PT Cancellation Note  Patient Details Name: Courtney Stewart MRN: [id number] DOB: Sep 27, 1970   Cancelled Treatment:    Reason Eval/Treat Not Completed: Patient at procedure or test/unavailable.  Pt was on bedrest earlier, off of bedrest and now at CT.  PT will check back later today or tomorrow as time allows.    Thanks,  Corinna Capra, PT, DPT  Acute Rehabilitation Ortho Tech Supervisor [phone] pager #[phone] office      [name] [name] 04/16/2021, 3:03 PM

## 2021-04-17 DIAGNOSIS — I619 Nontraumatic intracerebral hemorrhage, unspecified: Secondary | ICD-10-CM

## 2021-04-17 LAB — GLUCOSE, CAPILLARY
Glucose-Capillary: 126 mg/dL — ABNORMAL HIGH (ref 70–99)
Glucose-Capillary: 112 mg/dL — ABNORMAL HIGH (ref 70–99)
Glucose-Capillary: 183 mg/dL — ABNORMAL HIGH (ref 70–99)
Glucose-Capillary: 134 mg/dL — ABNORMAL HIGH (ref 70–99)

## 2021-04-17 LAB — CBC
HCT: 41.8 % (ref 36.0–46.0)
Hemoglobin: 13.6 g/dL (ref 12.0–15.0)
MCH: 29.2 pg (ref 26.0–34.0)
MCHC: 32.5 g/dL (ref 30.0–36.0)
MCV: 89.7 fL (ref 80.0–100.0)
Platelets: 328 10*3/uL (ref 150–400)
RBC: 4.66 MIL/uL (ref 3.87–5.11)
RDW: 13.6 % (ref 11.5–15.5)
WBC: 8.9 10*3/uL (ref 4.0–10.5)
nRBC: 0 % (ref 0.0–0.2)

## 2021-04-17 LAB — BASIC METABOLIC PANEL
Anion gap: 7 (ref 5–15)
BUN: 5 mg/dL — ABNORMAL LOW (ref 6–20)
CO2: 22 mmol/L (ref 22–32)
Calcium: 8.4 mg/dL — ABNORMAL LOW (ref 8.9–10.3)
Chloride: 110 mmol/L (ref 98–111)
Creatinine, Ser: 0.76 mg/dL (ref 0.44–1.00)
GFR, Estimated: >60 mL/min (ref >60)
Glucose, Bld: 141 mg/dL — ABNORMAL HIGH (ref 70–99)
Potassium: 3.6 mmol/L (ref 3.5–5.1)
Sodium: 139 mmol/L (ref 135–145)

## 2021-04-17 MED ORDER — HYDRALAZINE HCL 50 MG PO TABS
50.0000 mg | ORAL_TABLET | Freq: Three times a day (TID) | ORAL | Status: DC
  Administered 2021-04-17 – 2021-04-19 (×7): 50 mg via ORAL
  Filled 2021-04-17 (×7): qty 1

## 2021-04-17 MED ORDER — LABETALOL HCL 5 MG/ML IV SOLN
10.0000 mg | INTRAVENOUS | Status: DC | PRN
  Administered 2021-04-17 (×2): 10 mg via INTRAVENOUS
  Filled 2021-04-17 (×2): qty 4

## 2021-04-17 NOTE — Progress Notes (Signed)
Physical Therapy Treatment Patient Details Name: Courtney Stewart MRN: [id number] DOB: 29-Jun-1970 Today's Date: 04/17/2021   History of Present Illness 50 y.o. female admitted on 04/15/21 forHA and L sided weakness, slurred speech.  CT showed R BG ICH.  Pt with significant PMH of anemia, DM, HTN, NSTEMI, ulcerative colitis.    PT Comments    Pt continues to report HA 7/10 but is up in chair and states that she feels close to her baseline. Pt progressing very well from a mobility standpoint, ambulated 200' beginning with RW and ending with no AD with min-guard A and no LOB on level surface with minimal distraction. Pt remains slow to process info and commands. Updated follow up rec to OP PT for higher level balance before returning to work. PT will continue to follow.    Recommendations for follow up therapy are one component of a multi-disciplinary discharge planning process, led by the attending physician.  Recommendations may be updated based on patient status, additional functional criteria and insurance authorization.  Follow Up Recommendations  Outpatient PT     Assistance Recommended at Discharge Intermittent Supervision/Assistance  Equipment Recommendations  3in1 (PT)    Recommendations for Other Services       Precautions / Restrictions Precautions Precautions: Fall Precaution Comments: very mildly weak on left, BP goal <140 Restrictions Weight Bearing Restrictions: No     Mobility  Bed Mobility Overal bed mobility: Needs Assistance Bed Mobility: Supine to Sit     Supine to sit: Min assist;HOB elevated     General bed mobility comments: Min hand held assist to pull up to sitting from supine.  Pt used railing with free hand.    Transfers Overall transfer level: Needs assistance Equipment used: None Transfers: Sit to/from [employer] Sit to Stand: Min assist Stand pivot transfers: Min assist         General transfer comment: Min assist for  balance, pt relying on UE support on bed rail and armrests of chair during transitions.    Ambulation/Gait Ambulation/Gait assistance: Min guard Gait Distance (Feet): 200 Feet Assistive device: Rolling walker (2 wheels);None Gait Pattern/deviations: Step-through pattern;Wide base of support Gait velocity: decreased Gait velocity interpretation: <1.8 ft/sec, indicate of risk for recurrent falls General Gait Details: began with RW and pt steady with no LOB. Last 16' without AD and pt remained steady. Gait speed decreased and pt not challenged with high level activities due to head pain   Stairs             Wheelchair Mobility    Modified Rankin (Stroke Patients Only) Modified Rankin (Stroke Patients Only) Pre-Morbid Rankin Score: No symptoms Modified Rankin: Moderately severe disability     Balance Overall balance assessment: Mild deficits observed, not formally tested                                          Cognition Arousal/Alertness: Awake/alert Behavior During Therapy: Flat affect Overall Cognitive Status: Impaired/Different from baseline Area of Impairment: Problem solving                             Problem Solving: Slow processing General Comments: Requires increased time to process new info; slurred speech remains slightly. Passed Short Blessed Test.        Exercises      General Comments General comments (skin  integrity, edema, etc.): VSS. Discussed granddaughter living in the home with her and the need for her to take some quiet time for brain rest each day. Pt works as a Copy at [employer], quite physical job. Changing d/c rec to OP PT to work on higher level balance and cognitive tasks      Pertinent Vitals/Pain Pain Assessment: 0-10 Pain Score: 4  Pain Location: head Pain Descriptors / Indicators: Aching Pain Intervention(s): Monitored during session;Repositioned    Home Living Family/patient expects to be  discharged to:: Private residence Living Arrangements: Children (daughter 16 y.o.) Available Help at Discharge: Available PRN/intermittently (daughter works) Type of Home: Apartment Home Access: Level entry       Home Layout: One level Home Equipment: None      Prior Function            PT Goals (current goals can now be found in the care plan section) Acute Rehab PT Goals Patient Stated Goal: to decrease HA pain PT Goal Formulation: With patient Time For Goal Achievement: 04/30/21 Potential to Achieve Goals: Good Progress towards PT goals: Progressing toward goals    Frequency    Min 4X/week      PT Plan Discharge plan needs to be updated    Stewart-evaluation              AM-PAC PT "6 Clicks" Mobility   Outcome Measure  Help needed turning from your back to your side while in a flat bed without using bedrails?: A Little Help needed moving from lying on your back to sitting on the side of a flat bed without using bedrails?: A Little Help needed moving to and from a bed to a chair (including a wheelchair)?: A Little Help needed standing up from a chair using your arms (e.g., wheelchair or bedside chair)?: A Little Help needed to walk in hospital room?: A Little Help needed climbing 3-5 steps with a railing? : A Little 6 Click Score: 18    End of Session Equipment Utilized During Treatment: Gait belt Activity Tolerance: Patient tolerated treatment well Patient left: in chair;with call bell/phone within reach;with chair alarm set Nurse Communication: Mobility status PT Visit Diagnosis: Muscle weakness (generalized) (M62.81);Difficulty in walking, not elsewhere classified (R26.2);Hemiplegia and hemiparesis Hemiplegia - Right/Left: Left Hemiplegia - dominant/non-dominant: Non-dominant Hemiplegia - caused by: Nontraumatic intracerebral hemorrhage     Time: 1209-1228 PT Time Calculation (min) (ACUTE ONLY): 19 min  Charges:  $Gait Training: 8-22 mins                      Courtney Stewart, PT  Acute Rehab Services  Pager [phone] Office [phone]    Courtney Stewart Courtney Stewart 04/17/2021, 1:58 PM

## 2021-04-17 NOTE — Evaluation (Signed)
Occupational Therapy Evaluation Patient Details Name: Courtney Stewart MRN: [id number] DOB: 1970/07/06 Today's Date: 04/17/2021   History of Present Illness 50 y.o. female admitted on 04/15/21 forHA and L sided weakness, slurred speech.  CT showed R BG ICH.  Pt with significant PMH of anemia, DM, HTN, NSTEMI, ulcerative colitis.   Clinical Impression   Pt PTA: working and independent. Pt currently, requires increased time to process new info; slurred speech remains slightly. Passed Short Blessed Test. ADL tasks- SupervisionA advised to  modified independence; pt standing at sink for ADL tasks and no fatigue noticed. Education on needing to ask for help and reduce noise/stimulation for healing process. Pt would benefit from continued OT skilled services for higher level cognitive tasks. OT following  acutely.     Recommendations for follow up therapy are one component of a multi-disciplinary discharge planning process, led by the attending physician.  Recommendations may be updated based on patient status, additional functional criteria and insurance authorization.   Follow Up Recommendations  Outpatient OT    Assistance Recommended at Discharge Frequent or constant Supervision/Assistance  Functional Status Assessment  Patient has had a recent decline in their functional status and demonstrates the ability to make significant improvements in function in a reasonable and predictable amount of time.  Equipment Recommendations  None recommended by OT    Recommendations for Other Services       Precautions / Restrictions Precautions Precautions: Fall Precaution Comments: very mildly weak on left, BP goal <140 Restrictions Weight Bearing Restrictions: No      Mobility Bed Mobility               General bed mobility comments: in recliner pre and post session    Transfers Overall transfer level: Needs assistance Equipment used: None Transfers: Sit to/from [employer] Sit to Stand: Min assist Stand pivot transfers: Min assist         General transfer comment: Min assist for balance, pt relying on UE support on bed rail and armrests of chair during transitions.      Balance Overall balance assessment: Mild deficits observed, not formally tested                                         ADL either performed or assessed with clinical judgement   ADL Overall ADL's : Modified independent                                       General ADL Comments: SupervisionA advised; pt standing at sink for ADL tasks and no fatigue noticed.     Vision Baseline Vision/History: 0 No visual deficits Ability to See in Adequate Light: 0 Adequate Patient Visual Report: No change from baseline Vision Assessment?: Yes Eye Alignment: Within Functional Limits Ocular Range of Motion: Within Functional Limits Alignment/Gaze Preference: Within Defined Limits Additional Comments: read phone, clock and information packet sentence     Perception     Praxis      Pertinent Vitals/Pain Pain Assessment: 0-10 Pain Score: 4  Pain Location: head Pain Descriptors / Indicators: Aching Pain Intervention(s): Monitored during session;Repositioned     Hand Dominance Right   Extremity/Trunk Assessment Upper Extremity Assessment Upper Extremity Assessment: LUE deficits/detail LUE Deficits / Details: stregnth 4/5; mild coordination deficits   Lower Extremity  Assessment LLE Deficits / Details: left leg mildly weaker than right leg however, both grossly 4/5 when tested   Cervical / Trunk Assessment Cervical / Trunk Assessment: Normal   Communication Communication Communication: No difficulties   Cognition Arousal/Alertness: Awake/alert Behavior During Therapy: Flat affect Overall Cognitive Status: Impaired/Different from baseline Area of Impairment: Problem solving                             Problem Solving: Slow  processing General Comments: Requires increased time to process new info; slurred speech remains slightly. Passed Short Blessed Test.     General Comments  Education on needing to ask for help and reduce noise/stimulation for healing process.    Exercises     Shoulder Instructions      Home Living Family/patient expects to be discharged to:: Private residence Living Arrangements: Children (daughter 24 y.o.) Available Help at Discharge: Available PRN/intermittently (daughter works) Type of Home: Apartment Home Access: Level entry     Home Layout: One level     Bathroom Shower/Tub: Teacher, early years/pre: Standard     Home Equipment: None          Prior Functioning/Environment Prior Level of Function : Independent/Modified Independent             Mobility Comments: works as a Sports coach, drives, completely independent PTA. ADLs Comments: independent        OT Problem List: Decreased strength;Decreased activity tolerance;Decreased coordination;Decreased safety awareness;Impaired UE functional use      OT Treatment/Interventions: Self-care/ADL training;Therapeutic exercise;Therapeutic activities;Cognitive remediation/compensation;Patient/family education;Balance training;Neuromuscular education;Energy conservation    OT Goals(Current goals can be found in the care plan section) Acute Rehab OT Goals Patient Stated Goal: to go home OT Goal Formulation: With patient Time For Goal Achievement: 05/01/21 Potential to Achieve Goals: Good ADL Goals Additional ADL Goal #1: Pt will perform 3 step ADL tasks with <1 cue for sequencing. Additional ADL Goal #2: Pt will participate in higher level cognitive tasks <2 cues to attend.  OT Frequency: Min 2X/week   Barriers to D/C:            Co-evaluation              AM-PAC OT "6 Clicks" Daily Activity     Outcome Measure Help from another person eating meals?: None Help from another person taking care  of personal grooming?: A Little Help from another person toileting, which includes using toliet, bedpan, or urinal?: A Little Help from another person bathing (including washing, rinsing, drying)?: A Little Help from another person to put on and taking off regular upper body clothing?: None Help from another person to put on and taking off regular lower body clothing?: A Little 6 Click Score: 20   End of Session Equipment Utilized During Treatment: Gait belt;Rolling walker (2 wheels) Nurse Communication: Mobility status  Activity Tolerance: Patient tolerated treatment well Patient left: in chair;with call bell/phone within reach;with chair alarm set  OT Visit Diagnosis: Unsteadiness on feet (R26.81);Muscle weakness (generalized) (M62.81)                Time: [id number] OT Time Calculation (min): 29 min Charges:  OT General Charges $OT Visit: 1 Visit OT Evaluation $OT Eval Moderate Complexity: 1 Mod OT Treatments $Self Care/Home Management : 8-22 mins Jefferey Pica, OTR/L Acute Rehabilitation Services Pager: [phone] Office: [place] 04/17/2021, 2:05 PM

## 2021-04-17 NOTE — Progress Notes (Addendum)
STROKE TEAM PROGRESS NOTE   INTERVAL HISTORY Patient is seen in her room with her RN at the bedside. She has not had any acute events overnight. She reports a slight headache and fatigue but states that she feels better than yesterday.  She has been able to get OOB and ambulate to the bathroom.     Vitals:   04/17/21 0730 04/17/21 0745 04/17/21 0800 04/17/21 0815  BP: 139/65 120/66 (!) 128/45 (!) 122/50  Pulse:      Resp: 14 14 17 19   Temp:   98 F (36.7 C)   TempSrc:   Oral   SpO2:      Weight:      Height:       CBC:  Recent Labs  Lab 04/15/21 1638 04/16/21 0530 04/17/21 0414  WBC 7.9 10.2 8.9  NEUTROABS 5.3  --   --   HGB 12.5 13.6 13.6  HCT 38.8 41.8 41.8  MCV 90.9 88.2 89.7  PLT 306 351 328    Basic Metabolic Panel:  Recent Labs  Lab 04/16/21 0530 04/16/21 1138 04/16/21 1822 04/17/21 0414  NA 136   < > 137 139  K 4.2  --   --  3.6  CL 105  --   --  110  CO2 24  --   --  22  GLUCOSE 148*  --   --  141*  BUN 7  --   --  5*  CREATININE 0.76  --   --  0.76  CALCIUM 9.1  --   --  8.4*   < > = values in this interval not displayed.    Lipid Panel:  Recent Labs  Lab 04/16/21 0530  CHOL 203*  TRIG 234*  HDL 33*  CHOLHDL 6.2  VLDL 47*  LDLCALC 123*    HgbA1c:  Recent Labs  Lab 04/16/21 0530  HGBA1C 7.7*    Urine Drug Screen:  Recent Labs  Lab 04/16/21 0952  LABOPIA NONE DETECTED  COCAINSCRNUR NONE DETECTED  LABBENZ NONE DETECTED  AMPHETMU NONE DETECTED  THCU NONE DETECTED  LABBARB NONE DETECTED     Alcohol Level No results for input(s): ETH in the last 168 hours.  IMAGING past 24 hours CT HEAD WO CONTRAST (13/05/22)  Result Date: 04/16/2021 CLINICAL DATA:  Stroke, follow-up.  Intracranial hemorrhage. EXAM: CT HEAD WITHOUT CONTRAST TECHNIQUE: Contiguous axial images were obtained from the base of the skull through the vertex without intravenous contrast. COMPARISON:  04/15/2021 FINDINGS: Brain: An acute hemorrhage involving the lateral  aspect of the right basal ganglia/external capsule has not significantly changed in size upon remeasurement, measuring 4.0 x 1.7 x 2.4 cm (approximate volume of 8.2 mL). Mild surrounding edema and regional mass effect are unchanged including 5 mm of leftward midline shift. The ventricles are unchanged in size. No acute infarct is identified separate from the hemorrhage. Hypodensities elsewhere in the cerebral white matter bilaterally are unchanged and nonspecific but compatible with mild chronic small vessel ischemic disease. Vascular: No hyperdense vessel. Skull: No fracture or suspicious osseous lesion. Sinuses/Orbits: Mucous retention cyst in the left maxillary sinus. Clear mastoid air cells. Unremarkable orbits. Other: None. IMPRESSION: Unchanged right basal ganglia hemorrhage with 5 mm of leftward midline shift. Electronically Signed   By: 13/09/2020 M.D.   On: 04/16/2021 19:14   ECHOCARDIOGRAM COMPLETE  Result Date: 04/16/2021    ECHOCARDIOGRAM REPORT   Patient Name:   Courtney Stewart Date of Exam: 04/16/2021 Medical Rec #:  13/10/2020  Height:       62.0 in Accession #:    [id number]     Weight:       244.7 lb Date of Birth:  1970/11/22      BSA:          2.083 m Patient Age:    50 years       BP:           135/79 mmHg Patient Gender: F              HR:           78 bpm. Exam Location:  Inpatient Procedure: 2D Echo Indications:    stroke  History:        Patient has no prior history of Echocardiogram examinations.                 Risk Factors:Hypertension and Diabetes.  Sonographer:    Delcie Roch RDCS Referring Phys: [id number] Marvel Plan  Sonographer Comments: Patient is morbidly obese. Image acquisition challenging due to patient body habitus. IMPRESSIONS  1. Left ventricular ejection fraction, by estimation, is 60 to 65%. The left ventricle has normal function. The left ventricle has no regional wall motion abnormalities. There is mild left ventricular hypertrophy. Left ventricular diastolic  parameters were normal.  2. Right ventricular systolic function is normal. The right ventricular size is normal. Tricuspid regurgitation signal is inadequate for assessing PA pressure.  3. The mitral valve is normal in structure. No evidence of mitral valve regurgitation. No evidence of mitral stenosis.  4. The aortic valve is tricuspid. Aortic valve regurgitation is not visualized. No aortic stenosis is present.  5. The inferior vena cava is normal in size with greater than 50% respiratory variability, suggesting right atrial pressure of 3 mmHg. FINDINGS  Left Ventricle: Left ventricular ejection fraction, by estimation, is 60 to 65%. The left ventricle has normal function. The left ventricle has no regional wall motion abnormalities. The left ventricular internal cavity size was normal in size. There is  mild left ventricular hypertrophy. Left ventricular diastolic parameters were normal. Right Ventricle: The right ventricular size is normal. No increase in right ventricular wall thickness. Right ventricular systolic function is normal. Tricuspid regurgitation signal is inadequate for assessing PA pressure. Left Atrium: Left atrial size was normal in size. Right Atrium: Right atrial size was normal in size. Pericardium: There is no evidence of pericardial effusion. Mitral Valve: The mitral valve is normal in structure. No evidence of mitral valve regurgitation. No evidence of mitral valve stenosis. Tricuspid Valve: The tricuspid valve is normal in structure. Tricuspid valve regurgitation is trivial. No evidence of tricuspid stenosis. Aortic Valve: The aortic valve is tricuspid. Aortic valve regurgitation is not visualized. No aortic stenosis is present. Aortic valve mean gradient measures 7.6 mmHg. Aortic valve peak gradient measures 17.3 mmHg. Aortic valve area, by VTI measures 1.86  cm. Pulmonic Valve: The pulmonic valve was not well visualized. Pulmonic valve regurgitation is not visualized. No evidence of  pulmonic stenosis. Aorta: The aortic root is normal in size and structure. Venous: The inferior vena cava is normal in size with greater than 50% respiratory variability, suggesting right atrial pressure of 3 mmHg. IAS/Shunts: No atrial level shunt detected by color flow Doppler.  LEFT VENTRICLE PLAX 2D LVIDd:         5.90 cm   Diastology LVIDs:         3.70 cm   LV e' medial:    8.49 cm/s LV  PW:         1.10 cm   LV E/e' medial:  11.9 LV IVS:        1.30 cm   LV e' lateral:   11.00 cm/s LVOT diam:     2.10 cm   LV E/e' lateral: 9.2 LV SV:         73 LV SV Index:   35 LVOT Area:     3.46 cm  RIGHT VENTRICLE             IVC RV S prime:     17.20 cm/s  IVC diam: 1.50 cm TAPSE (M-mode): 2.8 cm LEFT ATRIUM             Index        RIGHT ATRIUM           Index LA diam:        4.30 cm 2.06 cm/m   RA Area:     12.90 cm LA Vol (A2C):   61.7 ml 29.62 ml/m  RA Volume:   27.10 ml  13.01 ml/m LA Vol (A4C):   48.2 ml 23.14 ml/m LA Biplane Vol: 56.6 ml 27.17 ml/m  AORTIC VALVE AV Area (Vmax):    1.85 cm AV Area (Vmean):   1.95 cm AV Area (VTI):     1.86 cm AV Vmax:           207.68 cm/s AV Vmean:          125.839 cm/s AV VTI:            0.394 m AV Peak Grad:      17.3 mmHg AV Mean Grad:      7.6 mmHg LVOT Vmax:         111.00 cm/s LVOT Vmean:        71.000 cm/s LVOT VTI:          0.212 m LVOT/AV VTI ratio: 0.54  AORTA Ao Root diam: 2.70 cm Ao Asc diam:  3.30 cm MITRAL VALVE MV Area (PHT): 5.02 cm     SHUNTS MV Decel Time: 151 msec     Systemic VTI:  0.21 m MV E velocity: 101.00 cm/s  Systemic Diam: 2.10 cm MV A velocity: 103.00 cm/s MV E/A ratio:  0.98 Dina Rich MD Electronically signed by Dina Rich MD Signature Date/Time: 04/16/2021/11:46:36 AM    Final     PHYSICAL EXAM General: Patient is an alert, well-nourished female in no acute distress.  General - well nourished, well developed, in no apparent distress.   Ext: No C/C/E. Skin: dry skin in b/l LE and wrist/hands.    Neuro - awake, alert, eyes  open, orientated to age, place, time. No aphasia. Will answers questions/communicate. following commands. Able to name and repeat. No gaze palsy, tracking bilaterally, visual field full, PERRL. Left facial droop. Tongue midline.  Motor: left sided weakness esp with grip 4/5.Marland Kitchen No drift in all ext. Left arm is subjectively heavier Sensation symmetrical bilaterally, bilateral FTN intact, gait not tested.     ASSESSMENT/PLAN Ms. Courtney Stewart is a 50 y.o. 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 She was found to have a right basal ganglia ICH and was admitted to the ICU and started on hypertonic saline. CT head yesterday appeared unchanged.  As patient is improving, will discontinue hypertonic saline.  Will increase hydralazine and add  PRN labetalol in an effort to wean cleviprex.  ICH:  right basal ganglia infarct embolic secondary to small vessel disease caused by hypertension and exacerbated by home anticoagulation Code Stroke CT head acute right basal ganglia hemorrhage with mild surrounding edema, mass effect and 1mm midline shift     CTA head & neck No acute abnormalities 2D Echo EF 60-65% with normal atrial septum LDL 123 HgbA1c 7.7 VTE prophylaxis - SCDs    Diet   Diet heart healthy/carb modified Room service appropriate? Yes with Assist; Fluid consistency: Thin   Eliquis (apixaban) daily prior to admission, now on No antithrombotic secondary to ICH Therapy recommendations:  pending Disposition:  pending  Hypertension Home meds:  Coreg 6.25 mg daily, hydralazine 25 mg q8h, losartan 25 mg daily, continued in hospital Add PRN IV labetalol and increase PO hydralazine Attempt to wear Cleviprex as able Unstable Keep SBP < 140 at this time, may liberalize to < 160 tomorrow Long-term BP goal normotensive  Hyperlipidemia Home meds:  none, LDL 123, goal <  70 High intensity statin to be started at discharge Continue statin at discharge  Diabetes type II Uncontrolled Home meds:  Invokana 300 mg daily, Glipzide 5 mg daily HgbA1c 7.7, goal < 7.0 CBGs Recent Labs    04/16/21 1716 04/16/21 1935 04/17/21 0718  GLUCAP 212* 188* 126*    Diabetes coordinator consult SSI  Other Stroke Risk Factors Obesity, Body mass index is 44.76 kg/m., BMI >/= 30 associated with increased stroke risk, recommend weight loss, diet and exercise as appropriate  Congestive heart failure  Other Active Problems CKD Monitor daily BUN/Cr Avoid contrast when possible Renally dose medications as appropriate  History of PE and prothrombin gene mutation Was taking Eliquis indefinitely for prevention of DVT/PE Will discuss resumption of home anticoagulation when ICH has resolved  Hospital day # 2  Courtney E Ernestina Columbia, NP   ATTENDING ATTESTATION:  Pt with right BG ICH s/p reversal with andexxa. Put on hypertonic saline but Na did not increase despite increase in rate yesterday.  Repeat CT yesterday stable. Clinically she is improving. Stop hyertonic saline today.  Stat head CT if any worsening. Echo neg.change to q2 hrs neuro checks from hourly.  Dr. Viviann Spare evaluated pt independently, reviewed imaging, chart, labs. Discussed and formulated plan with the APP. Please see APP note above for details.      This patient is critically ill due to ICH and at significant risk of neurological worsening, death form heart failure, respiratory failure, recurrent stroke, bleeding from [facility], seizure, sepsis. This patient's care requires constant monitoring of vital signs, hemodynamics, respiratory and cardiac monitoring, review of multiple databases, neurological assessment, discussion with family, other specialists and medical decision making of high complexity. I spent 35 minutes of neurocritical care time in the care of this patient.   Philopateer Strine,MD     To contact  Stroke Continuity provider, please refer to [email or web address]. After hours, contact General Neurology

## 2021-04-17 NOTE — Progress Notes (Signed)
Inpatient Diabetes Program Recommendations  AACE/ADA: New Consensus Statement on Inpatient Glycemic Control (2015)  Target Ranges:  Prepandial:   less than 140 mg/dL      Peak postprandial:   less than 180 mg/dL (1-2 hours)      Critically ill patients:  140 - 180 mg/dL   Lab Results  Component Value Date   GLUCAP 112 (H) 04/17/2021   HGBA1C 7.7 (H) 04/16/2021    Review of Glycemic Control  Diabetes history: DM2 Outpatient Diabetes medications: glipizide 5 mg QD (not taking), metformin 1000 mg BID Current orders for Inpatient glycemic control: Novolog 0-9 units TID with meals  CBGs 126, 112 mg/dL [id number] 5.7%  Inpatient Diabetes Program Recommendations:    Agree with orders.  Will need tight glucose control with risk factors.  See on 04/18/21.  Continue to follow.  Thank you. Ailene Ards, RD, LDN, CDE Inpatient Diabetes Coordinator [phone]

## 2021-04-18 LAB — CBC
HCT: 40.2 % (ref 36.0–46.0)
Hemoglobin: 13.4 g/dL (ref 12.0–15.0)
MCH: 29.5 pg (ref 26.0–34.0)
MCHC: 33.3 g/dL (ref 30.0–36.0)
MCV: 88.4 fL (ref 80.0–100.0)
Platelets: 345 10*3/uL (ref 150–400)
RBC: 4.55 MIL/uL (ref 3.87–5.11)
RDW: 13.6 % (ref 11.5–15.5)
WBC: 11.9 10*3/uL — ABNORMAL HIGH (ref 4.0–10.5)
nRBC: 0 % (ref 0.0–0.2)

## 2021-04-18 LAB — BASIC METABOLIC PANEL
Anion gap: 11 (ref 5–15)
BUN: 9 mg/dL (ref 6–20)
CO2: 21 mmol/L — ABNORMAL LOW (ref 22–32)
Calcium: 9 mg/dL (ref 8.9–10.3)
Chloride: 100 mmol/L (ref 98–111)
Creatinine, Ser: 0.84 mg/dL (ref 0.44–1.00)
GFR, Estimated: >60 mL/min (ref >60)
Glucose, Bld: 158 mg/dL — ABNORMAL HIGH (ref 70–99)
Potassium: 4.1 mmol/L (ref 3.5–5.1)
Sodium: 132 mmol/L — ABNORMAL LOW (ref 135–145)

## 2021-04-18 LAB — GLUCOSE, CAPILLARY
Glucose-Capillary: 141 mg/dL — ABNORMAL HIGH (ref 70–99)
Glucose-Capillary: 130 mg/dL — ABNORMAL HIGH (ref 70–99)
Glucose-Capillary: 159 mg/dL — ABNORMAL HIGH (ref 70–99)
Glucose-Capillary: 191 mg/dL — ABNORMAL HIGH (ref 70–99)

## 2021-04-18 MED ORDER — HYDRALAZINE HCL 20 MG/ML IJ SOLN
20.0000 mg | Freq: Four times a day (QID) | INTRAMUSCULAR | Status: DC | PRN
  Administered 2021-04-19: 20 mg via INTRAVENOUS
  Filled 2021-04-18: qty 1

## 2021-04-18 MED ORDER — LABETALOL HCL 5 MG/ML IV SOLN
20.0000 mg | INTRAVENOUS | Status: DC | PRN
  Filled 2021-04-18: qty 4

## 2021-04-18 MED ORDER — ACETAMINOPHEN-CODEINE #3 300-30 MG PO TABS
2.0000 | ORAL_TABLET | Freq: Four times a day (QID) | ORAL | Status: DC | PRN
  Administered 2021-04-18: 2 via ORAL
  Filled 2021-04-18: qty 2

## 2021-04-18 MED ORDER — FENTANYL CITRATE PF 50 MCG/ML IJ SOSY
25.0000 ug | PREFILLED_SYRINGE | Freq: Once | INTRAMUSCULAR | Status: AC
  Administered 2021-04-18: 25 ug via INTRAVENOUS
  Filled 2021-04-18: qty 1

## 2021-04-18 MED ORDER — LIVING WELL WITH DIABETES BOOK
Freq: Once | Status: AC
  Administered 2021-04-18: 1
  Filled 2021-04-18: qty 1

## 2021-04-18 MED ORDER — LABETALOL HCL 5 MG/ML IV SOLN
10.0000 mg | INTRAVENOUS | Status: DC | PRN
  Administered 2021-04-18: 10 mg via INTRAVENOUS
  Filled 2021-04-18: qty 4

## 2021-04-18 MED ORDER — PANTOPRAZOLE SODIUM 40 MG PO TBEC
40.0000 mg | DELAYED_RELEASE_TABLET | Freq: Every day | ORAL | Status: DC
  Administered 2021-04-18: 40 mg via ORAL
  Filled 2021-04-18: qty 1

## 2021-04-18 NOTE — Progress Notes (Addendum)
Occupational Therapy Treatment Patient Details Name: Courtney Stewart MRN: [id number] DOB: 09/01/1970 Today's Date: 04/18/2021   History of present illness 50 y.o. female admitted on 04/15/21 forHA and L sided weakness, slurred speech.  CT showed R BG ICH.  Pt with significant PMH of anemia, DM, HTN, NSTEMI, ulcerative colitis.   OT comments  This 50 yo female seen today with her being somewhat lethargic initially (per RN he had given her pain meds recently). Immediate recall good, delayed recall an issue. Overall at a S level. We will continue to follow.   Recommendations for follow up therapy are one component of a multi-disciplinary discharge planning process, led by the attending physician.  Recommendations may be updated based on patient status, additional functional criteria and insurance authorization.    Follow Up Recommendations  Outpatient OT    Assistance Recommended at Discharge Frequent or constant Supervision/Assistance  Equipment Recommendations  None recommended by OT       Precautions / Restrictions Precautions Precautions: Fall Precaution Comments: very mildly weak on left, BP goal <160 Restrictions Weight Bearing Restrictions: No       Mobility Bed Mobility               General bed mobility comments: up in recliner upon arrival    Transfers Overall transfer level: Needs assistance Equipment used: None Transfers: Sit to/from Stand Sit to Stand: Supervision           General transfer comment: Min guard assist for safety, cues for safe RW use when used.     Balance Overall balance assessment: Needs assistance Sitting-balance support: No upper extremity supported;Feet supported Sitting balance-Leahy Scale: Good     Standing balance support: No upper extremity supported;During functional activity Standing balance-Leahy Scale: Fair Standing balance comment: standing at sink to wash hands                           ADL either  performed or assessed with clinical judgement   ADL Overall ADL's : Needs assistance/impaired                                       General ADL Comments: overall at S due to lethargy and decreased cognition    Extremity/Trunk Assessment Upper Extremity Assessment Upper Extremity Assessment: LUE deficits/detail LUE Deficits / Details: stregnth 4/5; mild coordination deficits--encouraged her to use it as much as possible            Vision Ability to See in Adequate Light: 0 Adequate            Cognition Arousal/Alertness: Lethargic (but woke up when I got her up and moving) Behavior During Therapy: Flat affect Overall Cognitive Status: Impaired/Different from baseline Area of Impairment: Problem solving                             Problem Solving: Slow processing General Comments: Pt able to recall 3 items immediately, at 3 minutes with increased time and at 5 minutes needed VCs for 1 of 3          Exercises Exercises: General Lower Extremity General Exercises - Lower Extremity Long Arc Quad: AROM;Left;10 reps (5 second holds to increase quad strength)      General Comments VSS, BPs within parameters    Pertinent Vitals/ Pain  Pain Assessment: No/denies pain Faces Pain Scale: Hurts little more Pain Location: head Pain Descriptors / Indicators: Aching Pain Intervention(s): Limited activity within patient's tolerance;Monitored during session;Repositioned         Frequency  Min 3X/week        Progress Toward Goals  OT Goals(current goals can now be found in the care plan section)  Progress towards OT goals: Progressing toward goals  Acute Rehab OT Goals OT Goal Formulation: With patient Time For Goal Achievement: 05/01/21 Potential to Achieve Goals: Good  Plan Discharge plan remains appropriate       AM-PAC OT "6 Clicks" Daily Activity     Outcome Measure   Help from another person eating meals?: None Help from  another person taking care of personal grooming?: A Little Help from another person toileting, which includes using toliet, bedpan, or urinal?: A Little Help from another person bathing (including washing, rinsing, drying)?: A Little Help from another person to put on and taking off regular upper body clothing?: A Little Help from another person to put on and taking off regular lower body clothing?: A Little 6 Click Score: 19    End of Session Equipment Utilized During Treatment: Gait belt  OT Visit Diagnosis: Unsteadiness on feet (R26.81);Muscle weakness (generalized) (M62.81)   Activity Tolerance Patient tolerated treatment well   Patient Left in chair;with call bell/phone within reach;with chair alarm set   Nurse Communication Mobility status        Time: [id number] OT Time Calculation (min): 24 min  Charges: OT General Charges $OT Visit: 1 Visit OT Treatments $Self Care/Home Management : 23-37 mins  Golden Circle, OTR/L Acute [employer] Pager [phone] Office [phone]    Almon Register 04/18/2021, 4:22 PM

## 2021-04-18 NOTE — Progress Notes (Addendum)
STROKE TEAM PROGRESS NOTE   INTERVAL HISTORY Patient is lying in bed in NAD still on cleviprex gtt. No family at bedside. She is alert and oriented, moving all extremities. Patient c/o headache. Will change blood pressure goal to SBP goal <160. RN at bedside during rounds.  Neurological exam is unchanged.  Vital signs are stable.  Vitals:   04/18/21 1030 04/18/21 1045 04/18/21 1100 04/18/21 1200  BP:  (!) 130/53 134/63   Pulse: 75 79 73   Resp: 12 15 19    Temp:    97.8 F (36.6 C)  TempSrc:    Oral  SpO2: 98% 97% 97%   Weight:      Height:       CBC:  Recent Labs  Lab 04/15/21 1638 04/16/21 0530 04/17/21 0414 04/18/21 0216  WBC 7.9   < > 8.9 11.9*  NEUTROABS 5.3  --   --   --   HGB 12.5   < > 13.6 13.4  HCT 38.8   < > 41.8 40.2  MCV 90.9   < > 89.7 88.4  PLT 306   < > 328 345   < > = values in this interval not displayed.    Basic Metabolic Panel:  Recent Labs  Lab 04/17/21 0414 04/18/21 0400  NA 139 132*  K 3.6 4.1  CL 110 100  CO2 22 21*  GLUCOSE 141* 158*  BUN 5* 9  CREATININE 0.76 0.84  CALCIUM 8.4* 9.0    Lipid Panel:  Recent Labs  Lab 04/16/21 0530  CHOL 203*  TRIG 234*  HDL 33*  CHOLHDL 6.2  VLDL 47*  LDLCALC 123*    HgbA1c:  Recent Labs  Lab 04/16/21 0530  HGBA1C 7.7*    Urine Drug Screen:  Recent Labs  Lab 04/16/21 0952  LABOPIA NONE DETECTED  COCAINSCRNUR NONE DETECTED  LABBENZ NONE DETECTED  AMPHETMU NONE DETECTED  THCU NONE DETECTED  LABBARB NONE DETECTED     Alcohol Level No results for input(s): ETH in the last 168 hours.  IMAGING past 24 hours No results found.  PHYSICAL EXAM General: Patient is middle-aged obese African-American lady alert, n no acute distress.  General - well nourished, well developed, in no apparent distress.   Ext: No C/C/E. Skin: dry skin in b/l LE and wrist/hands.    Neuro - awake, alert, eyes open, oriented to age, place, time. No aphasia. Will answers questions/communicate. following  commands. Able to name and repeat.  No aphasia or dysarthria.  No gaze palsy, tracking bilaterally, visual field full, PERRL. Left facial droop. Tongue midline.  Motor: left sided weakness esp with grip 4/5.13/05/22 No drift in all ext. Left arm is subjectively heavier Sensation symmetrical bilaterally, bilateral FTN intact, gait not tested.     ASSESSMENT/PLAN Ms. Courtney Stewart is a 50 y.o. 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 She was found to have a right basal ganglia ICH and was admitted to the ICU and started on hypertonic saline. CT head yesterday appeared unchanged.  As patient is improving, will discontinue hypertonic saline.  Will increase hydralazine and add PRN labetalol in an effort to wean cleviprex.  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 Yes with Assist; Fluid consistency: Thin   Eliquis (apixaban) daily prior to admission, now on No antithrombotic secondary to ICH Therapy recommendations:  Outpt PT/OT Disposition:  pending  Hypertension Home meds:  Coreg 6.25 mg daily, hydralazine 25 mg q8h, losartan 25 mg daily, continued in hospital Add PRN IV labetalol and increase PO hydralazine Attempt to wean Cleviprex as able Unstable Keep SBP < 160  Long-term BP goal normotensive  Hyperlipidemia Home meds:  none, LDL 123, goal < 70 High intensity statin to be  started at discharge Continue statin at discharge  Diabetes type II Uncontrolled Home meds:  Invokana 300 mg daily, Glipzide 5 mg daily HgbA1c 7.7, goal < 7.0 CBGs Recent Labs    04/17/21 2129 04/18/21 0726 04/18/21 1124  GLUCAP 134* 141* 130*    Diabetes coordinator consult SSI  Other Stroke Risk Factors Obesity, Body mass index is 44.76 kg/m., BMI >/= 30 associated with increased stroke risk, recommend weight loss, diet and exercise as appropriate  Congestive heart failure  Other Active Problems CKD -Monitor daily BUN/Cr -Avoid contrast when possible -Renally dose medications as appropriate  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 Agree with note above.  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  She has history of DVT/PE and prothrombin gene mutation hence as high risk for recurrent thromboembolism and we will need to consider resuming anticoagulation in 2 to 4 weeks once hematoma has resolved.  Continue ongoing therapies and strict blood pressure control.  Mobilize out of bed.  Transfer out of ICU.  Discharged home with outpatient therapies in the next few days if stable.  Repeat CT head without contrast tomorrow morning This patient is critically ill and at significant risk of neurological worsening, death and care requires constant monitoring of vital signs,  hemodynamics,respiratory and cardiac monitoring, extensive review of multiple databases, frequent neurological assessment, discussion with family, other specialists and medical decision making of high complexity.I have made any additions or clarifications directly to the above note.This critical care time does not reflect procedure time, or teaching time or supervisory time of PA/NP/Med Resident etc but could involve care discussion time.  I spent 30 minutes of neurocritical care time  in the care of  this patient.     Delia Heady, MD Medical Director [facility] Pager: [phone] 04/18/2021 3:39 PM   To contact Stroke Continuity provider, please refer to [email or web address]. After hours, contact General Neurology

## 2021-04-18 NOTE — TOC CAGE-AID Note (Signed)
Transition of Care [facility]) - CAGE-AID Screening   Patient Details  Name: Courtney Stewart MRN: [id number] Date of Birth: 1971-04-27  Transition of Care [facility]) CM/SW Contact:    Janyra Barillas C Tarpley-Carter, LCSWA Phone Number: 04/18/2021, 12:54 PM   Clinical Narrative: Pt is unable to participate in Cage Aid.   Mariette Cowley Tarpley-Carter, MSW, LCSW-A Pronouns:  She/Her/Hers [facility] Health[facility]itions of Care Clinical Social Worker Direct Number:  [phone] [email or web address]  CAGE-AID Screening: Substance Abuse Screening unable to be completed due to: : Patient unable to participate             Substance Abuse Education Offered: No

## 2021-04-18 NOTE — Progress Notes (Addendum)
Dr. Pearlean Brownie gave RN verbal orders that the patient's blood pressure parameters are now systolic bp < 160.  He also stated that we can change the prn dose of labetalol to 20 mg IV and that we could change the tylenol to extra strength tylenol.

## 2021-04-18 NOTE — Progress Notes (Signed)
Physical Therapy Treatment Patient Details Name: Courtney Stewart MRN: [id number] DOB: [birth year] Today's Date: 04/18/2021   History of Present Illness 50 y.o. female admitted on 04/15/21 forHA and L sided weakness, slurred speech.  CT showed R BG ICH.  Pt with significant PMH of anemia, DM, HTN, NSTEMI, ulcerative colitis.    PT Comments    Pt is progressing gait, however, still feels she needs the support of the RW in the hallway, but is declining my recommendation that she has one at home for safety.  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  Pt needs to practice stairs prior to d/c home.  PT will continue to follow acutely for safe mobility progression.  Recommendations for follow up therapy are one component of a multi-disciplinary discharge planning process, led by the attending physician.  Recommendations may be updated based on patient status, additional functional criteria and insurance authorization.  Follow Up Recommendations  Outpatient PT     Assistance Recommended at Discharge Intermittent Supervision/Assistance  Equipment Recommendations  None recommended by PT (pt did not want a RW)    Recommendations for Other Services       Precautions / Restrictions Precautions Precautions: Fall Precaution Comments: very mildly weak on left, BP goal <140     Mobility  Bed Mobility               General bed mobility comments: Pt was OOB in the recliner chair, per RN just worked with OT a short time ago.    Transfers Overall transfer level: Needs assistance Equipment used: Rolling walker (2 wheels);None Transfers: Sit to/from Stand Sit to Stand: Min guard           General transfer comment: Min guard assist for safety, cues for safe RW use when used.    Ambulation/Gait Ambulation/Gait assistance: Min guard Gait Distance (Feet): 200 Feet Assistive device: Rolling walker (2 wheels);None Gait  Pattern/deviations: Step-through pattern;Antalgic Gait velocity: decreased Gait velocity interpretation: <1.8 ft/sec, indicate of risk for recurrent falls   General Gait Details: Started in room with hand held assist without RW and was min assist, pt chose to use RW in hallway, min guard assist with device.  Pt reports L knee instability and pain (was arthritic and would give out PTA).   Stairs             Wheelchair Mobility    Modified Rankin (Stroke Patients Only) Modified Rankin (Stroke Patients Only) Pre-Morbid Rankin Score: No symptoms Modified Rankin: Moderately severe disability     Balance Overall balance assessment: Needs assistance Sitting-balance support: Feet supported;No upper extremity supported Sitting balance-Leahy Scale: Normal     Standing balance support: Bilateral upper extremity supported;Single extremity supported Standing balance-Leahy Scale: Poor Standing balance comment: min assist without device and min guard with device, pt needs light assist in standing for safety and balance.                            Cognition Arousal/Alertness: Lethargic Behavior During Therapy: Flat affect Overall Cognitive Status: Impaired/Different from baseline Area of Impairment: Problem solving                             Problem Solving: Slow processing General Comments: Pt arouses to voice, but once still in chair, nods off.  Slow processing, but generally alert and oriented.  Exercises General Exercises - Lower Extremity Long Arc Quad: AROM;Left;10 reps (5 second holds to increase quad strength)    General Comments General comments (skin integrity, edema, etc.): VSS, BPs within parameters      Pertinent Vitals/Pain Pain Assessment: Faces Faces Pain Scale: Hurts little more Pain Location: head Pain Descriptors / Indicators: Aching Pain Intervention(s): Limited activity within patient's tolerance;Monitored during  session;Repositioned    Home Living                          Prior Function            PT Goals (current goals can now be found in the care plan section) Acute Rehab PT Goals Patient Stated Goal: to decrease HA pain Progress towards PT goals: Progressing toward goals    Frequency    Min 4X/week      PT Plan Current plan remains appropriate    Co-evaluation              AM-PAC PT "6 Clicks" Mobility   Outcome Measure  Help needed turning from your back to your side while in a flat bed without using bedrails?: A Little Help needed moving from lying on your back to sitting on the side of a flat bed without using bedrails?: A Little Help needed moving to and from a bed to a chair (including a wheelchair)?: A Little Help needed standing up from a chair using your arms (e.g., wheelchair or bedside chair)?: A Little Help needed to walk in hospital room?: A Little Help needed climbing 3-5 steps with a railing? : A Little 6 Click Score: 18    End of Session Equipment Utilized During Treatment: Gait belt Activity Tolerance: Patient limited by pain;Patient limited by fatigue Patient left: in chair;with call bell/phone within reach;with chair alarm set Nurse Communication: Mobility status PT Visit Diagnosis: Muscle weakness (generalized) (M62.81);Difficulty in walking, not elsewhere classified (R26.2);Hemiplegia and hemiparesis Hemiplegia - Right/Left: Left Hemiplegia - dominant/non-dominant: Non-dominant Hemiplegia - caused by: Nontraumatic intracerebral hemorrhage     Time: [id number] PT Time Calculation (min) (ACUTE ONLY): 14 min  Charges:  $Gait Training: 8-22 mins                    Verdene Lennert, PT, DPT  Acute Rehabilitation Ortho Tech Supervisor [phone] pager [phone]) [phone] office

## 2021-04-19 ENCOUNTER — Inpatient Hospital Stay (HOSPITAL_COMMUNITY): Payer: Self-pay

## 2021-04-19 LAB — GLUCOSE, CAPILLARY
Glucose-Capillary: 123 mg/dL — ABNORMAL HIGH (ref 70–99)
Glucose-Capillary: 156 mg/dL — ABNORMAL HIGH (ref 70–99)

## 2021-04-19 LAB — CBC
HCT: 38.7 % (ref 36.0–46.0)
Hemoglobin: 12.6 g/dL (ref 12.0–15.0)
MCH: 28.4 pg (ref 26.0–34.0)
MCHC: 32.6 g/dL (ref 30.0–36.0)
MCV: 87.4 fL (ref 80.0–100.0)
Platelets: 290 10*3/uL (ref 150–400)
RBC: 4.43 MIL/uL (ref 3.87–5.11)
RDW: 13.5 % (ref 11.5–15.5)
WBC: 7.8 10*3/uL (ref 4.0–10.5)
nRBC: 0 % (ref 0.0–0.2)

## 2021-04-19 LAB — BASIC METABOLIC PANEL
Anion gap: 10 (ref 5–15)
BUN: 12 mg/dL (ref 6–20)
CO2: 22 mmol/L (ref 22–32)
Calcium: 8.9 mg/dL (ref 8.9–10.3)
Chloride: 104 mmol/L (ref 98–111)
Creatinine, Ser: 0.9 mg/dL (ref 0.44–1.00)
GFR, Estimated: >60 mL/min (ref >60)
Glucose, Bld: 131 mg/dL — ABNORMAL HIGH (ref 70–99)
Potassium: 3.6 mmol/L (ref 3.5–5.1)
Sodium: 136 mmol/L (ref 135–145)

## 2021-04-19 MED ORDER — CARVEDILOL 6.25 MG PO TABS
6.2500 mg | ORAL_TABLET | Freq: Two times a day (BID) | ORAL | 1 refills | Status: AC
Start: 2021-04-19 — End: ?

## 2021-04-19 MED ORDER — HYDRALAZINE HCL 50 MG PO TABS: 50.0000 mg | ORAL_TABLET | Freq: Three times a day (TID) | ORAL | 1 refills | Status: AC

## 2021-04-19 NOTE — Progress Notes (Signed)
Physical Therapy Treatment Patient Details Name: Courtney Stewart MRN: [id number] DOB: [birth year] Today's Date: 04/19/2021   History of Present Illness 50 y.o. female admitted on 04/15/21 forHA and L sided weakness, slurred speech.  CT showed R BG ICH.  Pt with significant PMH of anemia, DM, HTN, NSTEMI, ulcerative colitis.    PT Comments    Pt progressing with mobility, ambulated with and without RW today and pt with more normal gait pattern, increased pace, and supervision level when using RW. She agreed with this as well and is willing to take one home now. Without RW needs min-guard A for balance and favors L side. Pt continues to have flat affect but was asking appropriate questions about stroke and was given handbook to read. PT will continue to follow.    Recommendations for follow up therapy are one component of a multi-disciplinary discharge planning process, led by the attending physician.  Recommendations may be updated based on patient status, additional functional criteria and insurance authorization.  Follow Up Recommendations  Outpatient PT     Assistance Recommended at Discharge Intermittent Supervision/Assistance  Equipment Recommendations  Rolling walker (2 wheels)    Recommendations for Other Services       Precautions / Restrictions Precautions Precautions: Fall Precaution Comments: very mildly weak on left, BP goal <160 Restrictions Weight Bearing Restrictions: No     Mobility  Bed Mobility               General bed mobility comments: in recliner    Transfers Overall transfer level: Needs assistance Equipment used: None Transfers: Sit to/from Stand Sit to Stand: Supervision           General transfer comment: stood from recliner and toilet without physical assist, no LOB    Ambulation/Gait Ambulation/Gait assistance: Min guard;Supervision Gait Distance (Feet): 500 Feet Assistive device: Rolling walker (2 wheels);None Gait  Pattern/deviations: Step-through pattern;Antalgic Gait velocity: decreased Gait velocity interpretation: >2.62 ft/sec, indicative of community ambulatory   General Gait Details: ambulated without RW first 100' with widened BOS and slightly antalgic gait favoring L side. Pt then ambulated 400' with RW and increased pace, supervision level, and smoother gait pattern.   Stairs Stairs:  (pt reports no stairs at home)           Wheelchair Mobility    Modified Rankin (Stroke Patients Only) Modified Rankin (Stroke Patients Only) Pre-Morbid Rankin Score: No symptoms Modified Rankin: Moderately severe disability     Balance Overall balance assessment: Needs assistance Sitting-balance support: No upper extremity supported;Feet supported Sitting balance-Leahy Scale: Good     Standing balance support: No upper extremity supported;During functional activity Standing balance-Leahy Scale: Fair                              Cognition Arousal/Alertness: Awake/alert Behavior During Therapy: Flat affect Overall Cognitive Status: Impaired/Different from baseline Area of Impairment: Problem solving                             Problem Solving: Slow processing General Comments: pt continues to be flat however she was asking appropriate questions today about her stroke and given stroke booklet to read and she began reading it. Also discussed RW for home and safety and pt agreeable        Exercises General Exercises - Lower Extremity Ankle Circles/Pumps: AROM;Both;10 reps;Seated Quad Sets: AROM;Both;10 reps;Seated Long Arc Quad: AROM;Left;10 reps (  5 second holds to increase quad strength) Heel Slides: AROM;Left;5 reps;Seated Straight Leg Raises: AROM;Left;10 reps;Seated    General Comments General comments (skin integrity, edema, etc.): VSS on RA, BP stable      Pertinent Vitals/Pain Pain Assessment: No/denies pain    Home Living                           Prior Function            PT Goals (current goals can now be found in the care plan section) Acute Rehab PT Goals Patient Stated Goal: go home PT Goal Formulation: With patient Time For Goal Achievement: 04/30/21 Potential to Achieve Goals: Good Progress towards PT goals: Progressing toward goals    Frequency    Min 4X/week      PT Plan Current plan remains appropriate    Co-evaluation              AM-PAC PT "6 Clicks" Mobility   Outcome Measure  Help needed turning from your back to your side while in a flat bed without using bedrails?: A Little Help needed moving from lying on your back to sitting on the side of a flat bed without using bedrails?: A Little Help needed moving to and from a bed to a chair (including a wheelchair)?: A Little Help needed standing up from a chair using your arms (e.g., wheelchair or bedside chair)?: A Little Help needed to walk in hospital room?: A Little Help needed climbing 3-5 steps with a railing? : A Little 6 Click Score: 18    End of Session Equipment Utilized During Treatment: Gait belt Activity Tolerance: Patient tolerated treatment well Patient left: in chair;with call bell/phone within reach Nurse Communication: Mobility status PT Visit Diagnosis: Muscle weakness (generalized) (M62.81);Difficulty in walking, not elsewhere classified (R26.2);Hemiplegia and hemiparesis Hemiplegia - Right/Left: Left Hemiplegia - dominant/non-dominant: Non-dominant Hemiplegia - caused by: Nontraumatic intracerebral hemorrhage     Time: [id number] PT Time Calculation (min) (ACUTE ONLY): 27 min  Charges:  $Gait Training: 8-22 mins $Therapeutic Exercise: 8-22 mins                     Leighton Roach, [place]  Pager [phone] Office [place] 04/19/2021, 12:04 PM

## 2021-04-19 NOTE — Progress Notes (Signed)
Patient and daughter, [place], were given discharge instructions.  Both peripheral Ivs were removed and patient was wheeled down to her daughters car.  With [employer] walker in tote.

## 2021-04-19 NOTE — Discharge Summary (Addendum)
Stroke Discharge Summary  Patient ID: Courtney Stewart   MRN: [id number]      DOB: Nov 24, 1970  Date of Admission: 04/15/2021 Date of Discharge: 04/19/2021  Attending Physician:  Stroke, Md, MD, Stroke MD Consultant(s):    None   Patient's PCP:  Bing Neighbors, FNP  DISCHARGE DIAGNOSIS:   Active Problems:   ICH (intracerebral hemorrhage) (HCC)  right basal ganglia hemorrhagic infarct  secondary to small vessel disease caused by hypertension and exacerbated by home Eliquis anticoagulation s/p reversal with Andexxa Left facial weakness  Allergies as of 04/19/2021       Reactions   Latex Shortness Of Breath, Swelling   Peanut Oil Anaphylaxis   Tomato Anaphylaxis   Amlodipine    Possibly diarrhea   Hydrochlorothiazide W-triamterene    Dehydration   Metoprolol    Side effect decreased energy   Oxycodone Other (See Comments)   Pt does not like to take it   Shellfish-derived Products         Medication List     STOP taking these medications    apixaban 5 MG Tabs tablet Commonly known as: ELIQUIS   furosemide 40 MG tablet Commonly known as: LASIX   isosorbide mononitrate 60 MG 24 hr tablet Commonly known as: IMDUR   losartan 100 MG tablet Commonly known as: COZAAR   losartan 25 MG tablet Commonly known as: COZAAR   ondansetron 8 MG disintegrating tablet Commonly known as: ZOFRAN-ODT   predniSONE 20 MG tablet Commonly known as: DELTASONE   rivaroxaban 20 MG Tabs tablet Commonly known as: XARELTO   spironolactone 50 MG tablet Commonly known as: ALDACTONE       TAKE these medications    canagliflozin 300 MG Tabs tablet Commonly known as: INVOKANA Take 300 mg by mouth daily before breakfast.   carvedilol 6.25 MG tablet Commonly known as: COREG Take 1 tablet (6.25 mg total) by mouth 2 (two) times daily with a meal. What changed: when to take this   cholecalciferol 25 MCG (1000 UNIT) tablet Commonly known as: VITAMIN D3 Take 1,000 Units by mouth  daily.   clobetasol cream 0.05 % Commonly known as: TEMOVATE Apply 1 application topically 2 (two) times daily. Apply to thicker itchy areas of eczema daily until smooth.   glipiZIDE 5 MG tablet Commonly known as: GLUCOTROL Take 1 tablet (5 mg total) by mouth daily before breakfast.   glucose blood test strip Test 4-5 times a week before eating and 3-4 times a week 2 hours after eating. Pt uses- one touch verio IQ meter What changed:  how much to take how to take this when to take this   hydrALAZINE 50 MG tablet Commonly known as: APRESOLINE Take 1 tablet (50 mg total) by mouth every 8 (eight) hours. What changed:  medication strength how much to take   hydrOXYzine 25 MG tablet Commonly known as: ATARAX/VISTARIL Take 25-50 mg by mouth at bedtime as needed for itching (sleep).   Magnesium 250 MG Tabs Take 250 mg by mouth daily.   metFORMIN 1000 MG tablet Commonly known as: GLUCOPHAGE Take 1,000 mg by mouth 2 (two) times daily with a meal.   OneTouch Delica Lancets Fine Misc TEST 4 TO 5 TMES A WEEK BEFORE EATING AND 3 TO 4 TIMES A WEEK, 2 HOURS AFTER EATING What changed: See the new instructions.   sacubitril-valsartan 97-103 MG Commonly known as: ENTRESTO Take 1 tablet by mouth 2 (two) times daily.   torsemide 20 MG  tablet Commonly known as: DEMADEX Take 60 mg by mouth 2 (two) times a day.   triamcinolone 0.025 % ointment Commonly known as: KENALOG Apply 1 application topically 2 (two) times daily. Do not apply to face               Durable Medical Equipment  (From admission, onward)           Start     Ordered   04/19/21 1145  For home use only DME Walker rolling  Once       Question Answer Comment  Walker: With 5 Inch Wheels   Patient needs a walker to treat with the following condition ICH (intracerebral hemorrhage) (HCC)      04/19/21 1145            LABORATORY STUDIES CBC    Component Value Date/Time   WBC 7.8 04/19/2021 0242    RBC 4.43 04/19/2021 0242   HGB 12.6 04/19/2021 0242   HGB 12.3 06/24/2018 1059   HCT 38.7 04/19/2021 0242   HCT 38.1 06/24/2018 1059   PLT 290 04/19/2021 0242   PLT 507 (H) 06/24/2018 1059   MCV 87.4 04/19/2021 0242   MCV 84 06/24/2018 1059   MCH 28.4 04/19/2021 0242   MCHC 32.6 04/19/2021 0242   RDW 13.5 04/19/2021 0242   RDW 13.9 06/24/2018 1059   LYMPHSABS 1.7 04/15/2021 1638   LYMPHSABS 3.0 06/24/2018 1059   MONOABS 0.6 04/15/2021 1638   EOSABS 0.2 04/15/2021 1638   EOSABS 0.2 06/24/2018 1059   BASOSABS 0.1 04/15/2021 1638   BASOSABS 0.1 06/24/2018 1059   CMP    Component Value Date/Time   NA 136 04/19/2021 0242   NA 142 11/27/2018 1135   K 3.6 04/19/2021 0242   CL 104 04/19/2021 0242   CO2 22 04/19/2021 0242   GLUCOSE 131 (H) 04/19/2021 0242   BUN 12 04/19/2021 0242   BUN 30 (H) 11/27/2018 1135   CREATININE 0.90 04/19/2021 0242   CREATININE 0.92 11/16/2016 1108   CALCIUM 8.9 04/19/2021 0242   PROT 6.8 04/15/2021 1638   PROT 6.5 11/27/2018 1135   ALBUMIN 3.3 (L) 04/15/2021 1638   ALBUMIN 3.4 (L) 11/27/2018 1135   AST 14 (L) 04/15/2021 1638   ALT 12 04/15/2021 1638   ALKPHOS 57 04/15/2021 1638   BILITOT 0.6 04/15/2021 1638   BILITOT 1.0 11/27/2018 1135   GFRNONAA >60 04/19/2021 0242   GFRAA 40 (L) 11/27/2018 1135   COAGS Lab Results  Component Value Date   INR 1.1 04/15/2021   INR 1.07 12/22/2015   Lipid Panel    Component Value Date/Time   CHOL 203 (H) 04/16/2021 0530   CHOL 186 06/24/2018 1059   TRIG 234 (H) 04/16/2021 0530   HDL 33 (L) 04/16/2021 0530   HDL 38 (L) 06/24/2018 1059   CHOLHDL 6.2 04/16/2021 0530   VLDL 47 (H) 04/16/2021 0530   LDLCALC 123 (H) 04/16/2021 0530   LDLCALC 119 (H) 06/24/2018 1059   HgbA1C  Lab Results  Component Value Date   HGBA1C 7.7 (H) 04/16/2021   Urinalysis    Component Value Date/Time   COLORURINE AMBER (A) 10/28/2018 1024   APPEARANCEUR HAZY (A) 10/28/2018 1024   LABSPEC 1.026 10/28/2018 1024    PHURINE 5.0 10/28/2018 1024   GLUCOSEU NEGATIVE 10/28/2018 1024   HGBUR NEGATIVE 10/28/2018 1024   BILIRUBINUR NEGATIVE 10/28/2018 1024   BILIRUBINUR n 08/12/2015 1118   KETONESUR 5 (A) 10/28/2018 1024   PROTEINUR 100 (A)  10/28/2018 1024   UROBILINOGEN 0.2 01/16/2016 1631   NITRITE NEGATIVE 10/28/2018 1024   LEUKOCYTESUR NEGATIVE 10/28/2018 1024   Urine Drug Screen     Component Value Date/Time   LABOPIA NONE DETECTED 04/16/2021 0952   COCAINSCRNUR NONE DETECTED 04/16/2021 0952   LABBENZ NONE DETECTED 04/16/2021 0952   AMPHETMU NONE DETECTED 04/16/2021 0952   THCU NONE DETECTED 04/16/2021 0952   LABBARB NONE DETECTED 04/16/2021 0952    Alcohol Level No results found for: [facility]   SIGNIFICANT DIAGNOSTIC STUDIES CT ANGIO HEAD NECK W WO CM  Result Date: 04/15/2021 CLINICAL DATA:  Left facial droop and left arm weakness EXAM: CT ANGIOGRAPHY HEAD AND NECK TECHNIQUE: Multidetector CT imaging of the head and neck was performed using the standard protocol during bolus administration of intravenous contrast. Multiplanar CT image reconstructions and MIPs were obtained to evaluate the vascular anatomy. Carotid stenosis measurements (when applicable) are obtained utilizing NASCET criteria, using the distal internal carotid diameter as the denominator. CONTRAST:  11mL OMNIPAQUE IOHEXOL 350 MG/ML SOLN COMPARISON:  None. FINDINGS: CTA NECK FINDINGS SKELETON: There is no bony spinal canal stenosis. No lytic or blastic lesion. OTHER NECK: Normal pharynx, larynx and major salivary glands. No cervical lymphadenopathy. Unremarkable thyroid gland. UPPER CHEST: No pneumothorax or pleural effusion. No nodules or masses. AORTIC ARCH: There is no calcific atherosclerosis of the aortic arch. There is no aneurysm, dissection or hemodynamically significant stenosis of the visualized portion of the aorta. Conventional 3 vessel aortic branching pattern. The visualized proximal subclavian arteries are widely patent. RIGHT  CAROTID SYSTEM: Normal without aneurysm, dissection or stenosis. LEFT CAROTID SYSTEM: Normal without aneurysm, dissection or stenosis. VERTEBRAL ARTERIES: Left dominant configuration. Both origins are clearly patent. There is no dissection, occlusion or flow-limiting stenosis to the skull base (V1-V3 segments). CTA HEAD FINDINGS POSTERIOR CIRCULATION: --Vertebral arteries: Normal V4 segments. --Inferior cerebellar arteries: Normal. --Basilar artery: Normal. --Superior cerebellar arteries: Normal. --Posterior cerebral arteries (PCA): Normal. ANTERIOR CIRCULATION: --Intracranial internal carotid arteries: Normal. --Anterior cerebral arteries (ACA): Normal. Both A1 segments are present. Patent anterior communicating artery (a-comm). --Middle cerebral arteries (MCA): Normal. VENOUS SINUSES: As permitted by contrast timing, patent. ANATOMIC VARIANTS: None Review of the MIP images confirms the above findings. IMPRESSION: Normal CTA of the head and neck. Electronically Signed   By: Deatra Robinson M.D.   On: 04/15/2021 20:46   CT HEAD WO CONTRAST ( )  Result Date: 04/19/2021 CLINICAL DATA:  Stroke, follow up EXAM: CT HEAD WITHOUT CONTRAST TECHNIQUE: Contiguous axial images were obtained from the base of the skull through the vertex without intravenous contrast. COMPARISON:  04/16/2021 FINDINGS: Brain: Parenchymal hemorrhage involving the right lentiform nucleus and subinsular white matter again identified and measures similar to slightly smaller in size. Similar mild surrounding edema. No substantial change in mass effect with minor leftward midline shift. No hydrocephalus. No new hemorrhage or loss of gray-white differentiation. Vascular: No new findings. Skull: Unremarkable. Sinuses/Orbits: No acute finding. Other: None. IMPRESSION: No substantial change in right basal ganglia hemorrhage. No new hemorrhage or worsening mass effect. Electronically Signed   By: Guadlupe Spanish M.D.   On: 04/19/2021 11:12   CT HEAD WO  CONTRAST ( )  Result Date: 04/16/2021 CLINICAL DATA:  Stroke, follow-up.  Intracranial hemorrhage. EXAM: CT HEAD WITHOUT CONTRAST TECHNIQUE: Contiguous axial images were obtained from the base of the skull through the vertex without intravenous contrast. COMPARISON:  04/15/2021 FINDINGS: Brain: An acute hemorrhage involving the lateral aspect of the right basal ganglia/external capsule has not significantly changed  in size upon remeasurement, measuring 4.0 x 1.7 x 2.4 cm (approximate volume of 8.2 mL). Mild surrounding edema and regional mass effect are unchanged including 5 mm of leftward midline shift. The ventricles are unchanged in size. No acute infarct is identified separate from the hemorrhage. Hypodensities elsewhere in the cerebral white matter bilaterally are unchanged and nonspecific but compatible with mild chronic small vessel ischemic disease. Vascular: No hyperdense vessel. Skull: No fracture or suspicious osseous lesion. Sinuses/Orbits: Mucous retention cyst in the left maxillary sinus. Clear mastoid air cells. Unremarkable orbits. Other: None. IMPRESSION: Unchanged right basal ganglia hemorrhage with 5 mm of leftward midline shift. Electronically Signed   By: Sebastian Ache M.D.   On: 04/16/2021 19:14   ECHOCARDIOGRAM COMPLETE  Result Date: 04/16/2021    ECHOCARDIOGRAM REPORT   Patient Name:   ETHELREDA SUKHU Date of Exam: 04/16/2021 Medical Rec #:  [id number]      Height:       62.0 in Accession #:    [id number]     Weight:       244.7 lb Date of Birth:  Oct 26, 1970      BSA:          2.083 m Patient Age:    50 years       BP:           135/79 mmHg Patient Gender: F              HR:           78 bpm. Exam Location:  Inpatient Procedure: 2D Echo Indications:    stroke  History:        Patient has no prior history of Echocardiogram examinations.                 Risk Factors:Hypertension and Diabetes.  Sonographer:    Delcie Roch RDCS Referring Phys: [id number] Marvel Plan  Sonographer Comments:  Patient is morbidly obese. Image acquisition challenging due to patient body habitus. IMPRESSIONS  1. Left ventricular ejection fraction, by estimation, is 60 to 65%. The left ventricle has normal function. The left ventricle has no regional wall motion abnormalities. There is mild left ventricular hypertrophy. Left ventricular diastolic parameters were normal.  2. Right ventricular systolic function is normal. The right ventricular size is normal. Tricuspid regurgitation signal is inadequate for assessing PA pressure.  3. The mitral valve is normal in structure. No evidence of mitral valve regurgitation. No evidence of mitral stenosis.  4. The aortic valve is tricuspid. Aortic valve regurgitation is not visualized. No aortic stenosis is present.  5. The inferior vena cava is normal in size with greater than 50% respiratory variability, suggesting right atrial pressure of 3 mmHg. FINDINGS  Left Ventricle: Left ventricular ejection fraction, by estimation, is 60 to 65%. The left ventricle has normal function. The left ventricle has no regional wall motion abnormalities. The left ventricular internal cavity size was normal in size. There is  mild left ventricular hypertrophy. Left ventricular diastolic parameters were normal. Right Ventricle: The right ventricular size is normal. No increase in right ventricular wall thickness. Right ventricular systolic function is normal. Tricuspid regurgitation signal is inadequate for assessing PA pressure. Left Atrium: Left atrial size was normal in size. Right Atrium: Right atrial size was normal in size. Pericardium: There is no evidence of pericardial effusion. Mitral Valve: The mitral valve is normal in structure. No evidence of mitral valve regurgitation. No evidence of mitral valve stenosis. Tricuspid Valve: The tricuspid valve is  normal in structure. Tricuspid valve regurgitation is trivial. No evidence of tricuspid stenosis. Aortic Valve: The aortic valve is tricuspid.  Aortic valve regurgitation is not visualized. No aortic stenosis is present. Aortic valve mean gradient measures 7.6 mmHg. Aortic valve peak gradient measures 17.3 mmHg. Aortic valve area, by VTI measures 1.86  cm. Pulmonic Valve: The pulmonic valve was not well visualized. Pulmonic valve regurgitation is not visualized. No evidence of pulmonic stenosis. Aorta: The aortic root is normal in size and structure. Venous: The inferior vena cava is normal in size with greater than 50% respiratory variability, suggesting right atrial pressure of 3 mmHg. IAS/Shunts: No atrial level shunt detected by color flow Doppler.  LEFT VENTRICLE PLAX 2D LVIDd:         5.90 cm   Diastology LVIDs:         3.70 cm   LV e' medial:    8.49 cm/s LV PW:         1.10 cm   LV E/e' medial:  11.9 LV IVS:        1.30 cm   LV e' lateral:   11.00 cm/s LVOT diam:     2.10 cm   LV E/e' lateral: 9.2 LV SV:         73 LV SV Index:   35 LVOT Area:     3.46 cm  RIGHT VENTRICLE             IVC RV S prime:     17.20 cm/s  IVC diam: 1.50 cm TAPSE (M-mode): 2.8 cm LEFT ATRIUM             Index        RIGHT ATRIUM           Index LA diam:        4.30 cm 2.06 cm/m   RA Area:     12.90 cm LA Vol (A2C):   61.7 ml 29.62 ml/m  RA Volume:   27.10 ml  13.01 ml/m LA Vol (A4C):   48.2 ml 23.14 ml/m LA Biplane Vol: 56.6 ml 27.17 ml/m  AORTIC VALVE AV Area (Vmax):    1.85 cm AV Area (Vmean):   1.95 cm AV Area (VTI):     1.86 cm AV Vmax:           207.68 cm/s AV Vmean:          125.839 cm/s AV VTI:            0.394 m AV Peak Grad:      17.3 mmHg AV Mean Grad:      7.6 mmHg LVOT Vmax:         111.00 cm/s LVOT Vmean:        71.000 cm/s LVOT VTI:          0.212 m LVOT/AV VTI ratio: 0.54  AORTA Ao Root diam: 2.70 cm Ao Asc diam:  3.30 cm MITRAL VALVE MV Area (PHT): 5.02 cm     SHUNTS MV Decel Time: 151 msec     Systemic VTI:  0.21 m MV E velocity: 101.00 cm/s  Systemic Diam: 2.10 cm MV A velocity: 103.00 cm/s MV E/A ratio:  0.98 Dina Rich MD Electronically  signed by Dina Rich MD Signature Date/Time: 04/16/2021/11:46:36 AM    Final    CT HEAD CODE STROKE WO CONTRAST  Result Date: 04/15/2021 CLINICAL DATA:  Code stroke.  Acute neuro deficit. EXAM: CT HEAD WITHOUT CONTRAST TECHNIQUE: Contiguous axial images were  obtained from the base of the skull through the vertex without intravenous contrast. COMPARISON:  CT head 01/23/2021 FINDINGS: Brain: Acute hemorrhage right basal ganglia measuring 3.7 x 14 x 23 mm. This is diffusely high density with mild surrounding edema. Hematoma volume 6 mL of blood. There is mass-effect on the right lateral ventricle with 5 mm midline shift to the left. Patchy hypodensity in the frontal white matter bilaterally similar to the prior study and consistent with chronic microvascular ischemia. Vascular: Negative for hyperdense vessel Skull: Negative Sinuses/Orbits: Retention cyst left maxillary sinus.  Normal orbit Other: None ASPECTS ([place] Stroke Program Early CT Score) Aspects not calculated due to intracranial hemorrhage. IMPRESSION: 1. Acute hemorrhage right basal ganglia likely due to hypertension. Mild surrounding edema. Mass-effect on the right lateral ventricle with 5 mm midline shift to the left. 2. Chronic microvascular ischemic change in the white matter. 3. These results were called by telephone at the time of interpretation on 04/15/2021 at 3:55 pm to provider JULIE HAVILAND , who verbally acknowledged these results. Electronically Signed   By: Marlan Palau M.D.   On: 04/15/2021 15:56      HISTORY OF PRESENT ILLNESS: admitting 11/4      Courtney Stewart is a 50 y.o. African American female with PMH of HTN, DM, HLD, CKD, PE on Eliquis, CHF presented to ED for code stroke.   Per EMS, pt started to have HA at 9am. Daughter drove her to see her doctor but at 1pm when they arrived home pt was not able to get out of the car and she was found to have left facial droop and left side weakness with slurry speech. Per pt, she  can not remember when the HA started but may be 11am, HA at right parietal area, dull pain, 10/10. She started to have left sided weakness at 1pm. She denies any N/V or LOC. BP with EMS 148/86 and glucose 153. In ER glucose 167 and SBP 136. CT showed right BG ICH about 10cc but there is MLS of 5mm. She received Andexxa reversal and on cleviprex for BP goal < 140.   Per note, she was on [facility] for PE, and recently 02/2021 changed from Xarelto to Eliquis due to experienced forgetfulness that interfered with her daily life. 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    HOSPITAL COURSE  ICH:  right basal ganglia hemorrhagic infarct  secondary to small vessel disease caused by hypertension and exacerbated by home Eliquis anticoagulation s/p reversal with Andexxa Code Stroke CT head acute right basal ganglia hemorrhage with mild surrounding edema, mass effect and 5mm midline shift     CTA head & neck No acute abnormalities 2D Echo EF 60-65% with normal atrial septum LDL 123 HgbA1c 7.7 VTE prophylaxis - SCDs       Diet    Diet heart healthy/carb modified Room service appropriate? Yes with Assist; Fluid consistency: Thin        Eliquis (apixaban) daily prior to admission, now on No antithrombotic secondary to ICH Therapy recommendations:  Outpt PT/OT Disposition:  home today with home PT>  Spoke with patient's daughter at length regarding plan of care.    Hypertension Home meds:  Coreg 6.25 mg daily, hydralazine 25 mg q8h, losartan 25 mg daily, continued in hospital Add PRN IV  labetalol and increase PO hydralazine Attempt to wean Cleviprex as able Unstable Keep SBP < 160  Long-term BP goal normotensive   Hyperlipidemia Home meds:  none, LDL 123, goal < 70 High intensity statin to be started at  discharge Continue statin at discharge   Diabetes type II Uncontrolled Home meds:  Invokana 300 mg daily, Glipzide 5 mg daily HgbA1c 7.7, goal < 7.0 CBGs    DISCHARGE EXAM Blood pressure (!) 154/75, pulse 67, temperature 97.7 F (36.5 C), temperature source Oral, resp. rate 16, height 5\' 2"  (1.575 m), weight 111 kg, SpO2 96 %.    Discharge Diet       Diet   Diet heart healthy/carb modified Room service appropriate? Yes with Assist; Fluid consistency: Thin   liquids  DISCHARGE PLAN Disposition:  home No antithrombotic may need to consider resuming anticoagulation in 4 to 6 weeks for her history of PE Ongoing stroke risk factor control by Primary Care Physician at time of discharge Follow-up PCP , FNP in 2 weeks. Follow-up in [facility] in 4 weeks, office to schedule an appointment.    35 minutes were spent preparing discharge.    I have personally obtained history,examined this patient, reviewed notes, independently viewed imaging studies, participated in medical decision making and plan of care.ROS completed by me personally and pertinent positives fully documented  I have made any additions or clarifications directly to the above note. Agree with note above.    Bing Neighbors, MD Medical Director [facility] Pager: [phone] 04/19/2021 5:16 PM

## 2021-04-19 NOTE — TOC Transition Note (Addendum)
Transition of Care [facility]) - CM/SW Discharge Note   Patient Details  Name: Courtney Stewart MRN: [id number] Date of Birth: 10-Apr-1971  Transition of Care [facility]) CM/SW Contact:  [name], RN Phone Number: 04/19/2021, 12:45pm   Clinical Narrative:    50 y.o. female admitted on 04/15/21 forHA and L sided weakness, slurred speech.  CT showed R BG ICH.  Prior to admission, patient independent and living at home with daughter.  PT/OT recommending outpatient follow-up.  Met with patient and her daughter, at bedside to review discharge arrangements.  Patient/daughter agreeable to outpatient rehab referral; referral made to [facility] neuro rehab for follow-up.  Referral to [place] for rolling walker, as recommended by physical therapist; walker to be delivered to bedside prior to discharge home today.  Patient states that she does have a PCP, [name], NP, and that she does have financial assistance with her medications. Daughter upset that she has not spoken with the physician regarding her mother's condition and prognosis.  She inquired about disability and Medicaid benefits for patient and states that she wants this done before patient leaves.  Daughter states that she did receive a call from financial counseling yesterday, but does not remember what they said or who she spoke with.  Offered to send financial counselor message to see if they can follow-up with her regarding Medicaid and disability application.  She then demanded to speak with the social worker regarding applying for Medicaid and disability.  She states that she knows individuals who were able to get all of this done before they were discharged.  Explained to her that this is done through financial counseling and contracted to send an urgent message to their supervisor. Message has been sent to [employer] supervisor; they plan to reach out to patient's daughter, Courtney Stewart, for follow up. Bedside nurse states he will have Stroke PA  follow up with daughter as well.    Final next level of care: OP Rehab Barriers to Discharge: Barriers Resolved   Patient Goals and CMS Choice Patient states their goals for this hospitalization and ongoing recovery are:: to go home                          Discharge Plan and Services   Discharge Planning Services: CM Consult, Medication Assistance, [place] Program            DME Arranged: Walker rolling DME Agency: [employer] Date DME Agency Contacted: 04/19/21 Time DME Agency Contacted: 1300 Representative spoke with at DME Agency: [place] Interventions     Readmission Risk Interventions No flowsheet data found.  Reinaldo Raddle, RN, BSN  Trauma/Neuro ICU Case Manager [phone]

## 2021-04-19 NOTE — Progress Notes (Signed)
Inpatient Diabetes Program Recommendations  AACE/ADA: New Consensus Statement on Inpatient Glycemic Control (2015)  Target Ranges:  Prepandial:   less than 140 mg/dL      Peak postprandial:   less than 180 mg/dL (1-2 hours)      Critically ill patients:  140 - 180 mg/dL   Lab Results  Component Value Date   GLUCAP 156 (H) 04/19/2021   HGBA1C 7.7 (H) 04/16/2021    Review of Glycemic Control  Diabetes history: DM2 Outpatient Diabetes medications: glipizide 5 mg QD (not taking), metformin 1000 mg BID Current orders for Inpatient glycemic control: Novolog 0-9 units TID with meals  Inpatient Diabetes Program Recommendations:   Spoke with pt about A1C results 7.7 and explained what an A1C is, basic pathophysiology of DM Type 2, basic home care, basic diabetes diet nutrition principles, importance of checking CBGs and maintaining good CBG control to prevent long-term and short-term complications. Reviewed signs and symptoms of hyperglycemia and hypoglycemia and how to treat hypoglycemia at home. Also reviewed blood sugar goals at home.  Patient states her last A1c @ her doctor's office was approximately 7.2. Patient states willingness to attempt to decrease further her carbohydrates and keep check on her CBGs and report to her doctor. Discussed risks of elevated CBGs on vascular risks including CVA. Patient very receptive to discussion.  Thank you, Billy Fischer. Kyl Givler, RN, MSN, CDE  Diabetes Coordinator Inpatient Glycemic Control Team Team Pager [phone] (8am-5pm) 04/19/2021 12:20 PM

## 2021-04-20 ENCOUNTER — Telehealth: Payer: Self-pay

## 2021-04-20 NOTE — Telephone Encounter (Signed)
Transition Care Management Unsuccessful Follow-up Telephone Call  Date of discharge and from where:  04/19/2021, [facility]  Attempts:  1st Attempt  Reason for unsuccessful TCM follow-up call:  Left voice message on # [phone], call back requested.   Need to inquire if patient wants to schedule a hospital follow up appointment at one of the [facility].  Jerrilyn Cairo, FNP is listed as her PCP but she has not seen the patient in 2 years and Ms Tiburcio Pea is no longer at [facility].

## 2021-04-21 ENCOUNTER — Telehealth: Payer: Self-pay

## 2021-04-21 NOTE — Telephone Encounter (Signed)
Transition Care Management Unsuccessful Follow-up Telephone Call  Date of discharge and from where:  04/19/2021, [facility]   Attempts:  2nd Attempt  Reason for unsuccessful TCM follow-up call:  Left voice message  on # [phone], call back requested.    Need to inquire if patient wants to schedule a hospital follow up appointment at one of the [facility].  Jerrilyn Cairo, FNP is listed as her PCP but she has not seen the patient in 2 years and Ms Tiburcio Pea is no longer at [facility].

## 2021-04-22 ENCOUNTER — Telehealth: Payer: Self-pay

## 2021-04-22 ENCOUNTER — Encounter: Payer: Self-pay | Admitting: Family Medicine

## 2021-04-22 NOTE — Telephone Encounter (Signed)
Transition Care Management Unsuccessful Follow-up Telephone Call   Date of discharge and from where:  04/19/2021, [facility]    Attempts:  3rd Attempt   Reason for unsuccessful TCM follow-up call:  Left voice message  on # [phone], call back requested.

## 2021-05-08 ENCOUNTER — Other Ambulatory Visit: Payer: Self-pay

## 2021-05-08 ENCOUNTER — Emergency Department (HOSPITAL_COMMUNITY): Payer: Self-pay

## 2021-05-08 ENCOUNTER — Encounter (HOSPITAL_COMMUNITY): Payer: Self-pay | Admitting: *Deleted

## 2021-05-08 ENCOUNTER — Emergency Department (HOSPITAL_COMMUNITY)
Admission: EM | Admit: 2021-05-08 | Discharge: 2021-05-08 | Disposition: A | Discharge status: Home / Self Care | Payer: Self-pay | Attending: Emergency Medicine | Admitting: Emergency Medicine

## 2021-05-08 DIAGNOSIS — I10 Essential (primary) hypertension: Secondary | ICD-10-CM | POA: Insufficient documentation

## 2021-05-08 DIAGNOSIS — R0602 Shortness of breath: Secondary | ICD-10-CM | POA: Insufficient documentation

## 2021-05-08 DIAGNOSIS — Z91010 Allergy to peanuts: Secondary | ICD-10-CM | POA: Insufficient documentation

## 2021-05-08 DIAGNOSIS — Z79899 Other long term (current) drug therapy: Secondary | ICD-10-CM | POA: Insufficient documentation

## 2021-05-08 DIAGNOSIS — Z91040 Latex allergy status: Secondary | ICD-10-CM | POA: Insufficient documentation

## 2021-05-08 DIAGNOSIS — Z7984 Long term (current) use of oral hypoglycemic drugs: Secondary | ICD-10-CM | POA: Insufficient documentation

## 2021-05-08 DIAGNOSIS — R079 Chest pain, unspecified: Secondary | ICD-10-CM | POA: Insufficient documentation

## 2021-05-08 DIAGNOSIS — E119 Type 2 diabetes mellitus without complications: Secondary | ICD-10-CM | POA: Insufficient documentation

## 2021-05-08 LAB — CBC WITH DIFFERENTIAL/PLATELET
Abs Immature Granulocytes: 0.02 10*3/uL (ref 0.00–0.07)
Basophils Absolute: 0.1 10*3/uL (ref 0.0–0.1)
Basophils Relative: 1 %
Eosinophils Absolute: 0.3 10*3/uL (ref 0.0–0.5)
Eosinophils Relative: 5 %
HCT: 41 % (ref 36.0–46.0)
Hemoglobin: 13.2 g/dL (ref 12.0–15.0)
Immature Granulocytes: 0 %
Lymphocytes Relative: 34 %
Lymphs Abs: 2.1 10*3/uL (ref 0.7–4.0)
MCH: 28.9 pg (ref 26.0–34.0)
MCHC: 32.2 g/dL (ref 30.0–36.0)
MCV: 89.7 fL (ref 80.0–100.0)
Monocytes Absolute: 0.5 10*3/uL (ref 0.1–1.0)
Monocytes Relative: 8 %
Neutro Abs: 3.3 10*3/uL (ref 1.7–7.7)
Neutrophils Relative %: 52 %
Platelets: 363 10*3/uL (ref 150–400)
RBC: 4.57 MIL/uL (ref 3.87–5.11)
RDW: 13 % (ref 11.5–15.5)
WBC: 6.3 10*3/uL (ref 4.0–10.5)
nRBC: 0 % (ref 0.0–0.2)

## 2021-05-08 LAB — CBG MONITORING, ED: Glucose-Capillary: 95 mg/dL (ref 70–99)

## 2021-05-08 LAB — BRAIN NATRIURETIC PEPTIDE: B Natriuretic Peptide: 109.9 pg/mL — ABNORMAL HIGH (ref 0.0–100.0)

## 2021-05-08 LAB — BASIC METABOLIC PANEL
Anion gap: 7 (ref 5–15)
BUN: 14 mg/dL (ref 6–20)
CO2: 27 mmol/L (ref 22–32)
Calcium: 9.6 mg/dL (ref 8.9–10.3)
Chloride: 104 mmol/L (ref 98–111)
Creatinine, Ser: 0.84 mg/dL (ref 0.44–1.00)
GFR, Estimated: >60 mL/min (ref >60)
Glucose, Bld: 109 mg/dL — ABNORMAL HIGH (ref 70–99)
Potassium: 3.9 mmol/L (ref 3.5–5.1)
Sodium: 138 mmol/L (ref 135–145)

## 2021-05-08 LAB — TROPONIN I (HIGH SENSITIVITY)
Troponin I (High Sensitivity): 5 ng/L (ref <18)
Troponin I (High Sensitivity): 5 ng/L (ref <18)

## 2021-05-08 MED ORDER — HYDRALAZINE HCL 25 MG PO TABS
50.0000 mg | ORAL_TABLET | Freq: Once | ORAL | Status: AC
  Administered 2021-05-08: 21:00:00 50 mg via ORAL
  Filled 2021-05-08: qty 2

## 2021-05-08 NOTE — ED Notes (Signed)
Ekg completed prior to coming to room

## 2021-05-08 NOTE — ED Notes (Signed)
This RN spoke with ED phlebotomist per need for collection of second timed troponin

## 2021-05-08 NOTE — ED Triage Notes (Signed)
The pt has had rt sided chest pain since 1500 this afternoon  no cough sob or dizziness  lmp none

## 2021-05-08 NOTE — ED Provider Notes (Signed)
[facility] EMERGENCY DEPARTMENT Provider Note   CSN: [id number] Arrival date & time: 05/08/21  1620     History Chief Complaint  Patient presents with   Chest Pain    Courtney Stewart is a 50 y.o. female.  Patient presents chief complaint of mid chest pain.  Triage notation states it was on the right side, however when patient points she points to the mid chest between her breasts.  It occurred prior to arrival lasted for about 30 minutes and now has since resolved.  Denies any pain or discomfort at this time.  No radiation of the pain no associated fevers or cough no vomiting or diarrhea reported.      Past Medical History:  Diagnosis Date   Anemia    Diabetes mellitus without complication (HCC)    Hypertension    NSTEMI (non-ST elevated myocardial infarction) (HCC)    UC (ulcerative colitis) (HCC)     Patient Active Problem List   Diagnosis Date Noted   ICH (intracerebral hemorrhage) (HCC) 04/15/2021   Personal history of noncompliance with medical treatment, presenting hazards to health 11/16/2016   NSTEMI (non-ST elevated myocardial infarction) (HCC) 12/21/2015   Morbid obesity (HCC) 08/12/2015   Diabetes mellitus without complication (HCC) 08/12/2015   Essential hypertension 08/12/2015    Past Surgical History:  Procedure Laterality Date   CARDIAC CATHETERIZATION N/A 12/22/2015   Procedure: Left Heart Cath and Coronary Angiography;  Surgeon: Yates Decamp, MD;  Location: [facility] INVASIVE CV LAB;  Service: Cardiovascular;  Laterality: N/A;   CESAREAN SECTION       OB History   No obstetric history on file.     Family History  Problem Relation Age of Onset   Diabetes Mother    Hyperlipidemia Mother    Hypertension Mother    Kidney disease Mother    Colon cancer Father     Social History   Tobacco Use   Smoking status: Never   Smokeless tobacco: Never  Substance Use Topics   Alcohol use: No    Alcohol/week: 0.0 standard drinks   Drug use:  No    Home Medications Prior to Admission medications   Medication Sig Start Date End Date Taking? Authorizing Provider  canagliflozin (INVOKANA) 300 MG TABS tablet Take 300 mg by mouth daily before breakfast.    [provider]  carvedilol (COREG) 6.25 MG tablet Take 1 tablet (6.25 mg total) by mouth 2 (two) times daily with a meal. 04/19/21   Suzan Garibaldi, PA-C  cholecalciferol (VITAMIN D3) 25 MCG (1000 UT) tablet Take 1,000 Units by mouth daily. Patient not taking: Reported on 04/16/2021    [provider]  clobetasol cream (TEMOVATE) 0.05 % Apply 1 application topically 2 (two) times daily. Apply to thicker itchy areas of eczema daily until smooth.    [provider]  glipiZIDE (GLUCOTROL) 5 MG tablet Take 1 tablet (5 mg total) by mouth daily before breakfast. Patient not taking: No sig reported 11/13/18   Bing Neighbors, FNP  glucose blood test strip Test 4-5 times a week before eating and 3-4 times a week 2 hours after eating. Pt uses- one touch verio IQ meter Patient taking differently: 1 each by Other route See admin instructions. Test 4-5 times a week before eating and 3-4 times a week 2 hours after eating. Pt uses- one touch verio IQ meter 08/24/15   Henson, Vickie L, PA-C  hydrALAZINE (APRESOLINE) 50 MG tablet Take 1 tablet (50 mg total) by  mouth every 8 (eight) hours. 04/19/21   Dennison Mascot, PA-C  hydrOXYzine (ATARAX/VISTARIL) 25 MG tablet Take 25-50 mg by mouth at bedtime as needed for itching (sleep).    [provider]  Magnesium 250 MG TABS Take 250 mg by mouth daily.    [provider]  metFORMIN (GLUCOPHAGE) 1000 MG tablet Take 1,000 mg by mouth 2 (two) times daily with a meal.    [provider]  ONETOUCH DELICA LANCETS FINE MISC TEST 4 TO 5 TMES A WEEK BEFORE EATING AND 3 TO 4 TIMES A WEEK, 2 HOURS AFTER EATING Patient taking differently: 1 each by Other route See admin instructions. TEST 4 TO 5 TMES A WEEK BEFORE  EATING AND 3 TO 4 TIMES A WEEK, 2 HOURS AFTER EATING 08/24/15   Henson, Vickie L, PA-C  sacubitril-valsartan (ENTRESTO) 97-103 MG Take 1 tablet by mouth 2 (two) times daily.    [provider]  torsemide (DEMADEX) 20 MG tablet Take 60 mg by mouth 2 (two) times a day. 12/11/18 04/16/21  [provider]  triamcinolone (KENALOG) 0.025 % ointment Apply 1 application topically 2 (two) times daily. Do not apply to face Patient not taking: Reported on 04/16/2021 12/26/18   [name], MD    Allergies    Latex, Peanut oil, Tomato, Amlodipine, Hydrochlorothiazide w-triamterene, Metoprolol, Oxycodone, and Shellfish-derived products  Review of Systems   Review of Systems  Constitutional:  Negative for fever.  HENT:  Negative for ear pain.   Eyes:  Negative for pain.  Respiratory:  Negative for cough.   Cardiovascular:  Positive for chest pain.  Gastrointestinal:  Negative for abdominal pain.  Genitourinary:  Negative for flank pain.  Musculoskeletal:  Negative for back pain.  Skin:  Negative for rash.  Neurological:  Negative for headaches.   Physical Exam Updated Vital Signs BP (!) 165/69   Pulse 81   Temp 99.4 F (37.4 C) (Oral)   Resp 18   Ht 5\' 2"  (1.575 m)   Wt 111 kg   LMP  (LMP Unknown)   SpO2 99%   BMI 44.76 kg/m   Physical Exam Constitutional:      General: She is not in acute distress.    Appearance: Normal appearance.  HENT:     Head: Normocephalic.     Nose: Nose normal.  Eyes:     Extraocular Movements: Extraocular movements intact.  Cardiovascular:     Rate and Rhythm: Normal rate.  Pulmonary:     Effort: Pulmonary effort is normal.  Abdominal:     Tenderness: There is no abdominal tenderness. There is no guarding or rebound.  Musculoskeletal:        General: Normal range of motion.     Cervical back: Normal range of motion.  Neurological:     General: No focal deficit present.     Mental Status: She is alert. Mental status is at baseline.     ED Results / Procedures / Treatments   Labs (all labs ordered are listed, but only abnormal results are displayed) Labs Reviewed  BASIC METABOLIC PANEL - Abnormal; Notable for the following components:      Result Value   Glucose, Bld 109 (*)    All other components within normal limits  BRAIN NATRIURETIC PEPTIDE - Abnormal; Notable for the following components:   B Natriuretic Peptide 109.9 (*)    All other components within normal limits  CBC WITH DIFFERENTIAL/PLATELET  CBG MONITORING, ED  TROPONIN I (HIGH SENSITIVITY)  TROPONIN I (HIGH SENSITIVITY)    EKG EKG Interpretation  Date/Time:  Sunday May 08 2021 16:41:25 EST Ventricular Rate:  77 PR Interval:  152 QRS Duration: 86 QT Interval:  404 QTC Calculation: 457 R Axis:   88 Text Interpretation: Normal sinus rhythm Cannot rule out Anterior infarct , age undetermined Abnormal ECG Confirmed by Thamas Jaegers ([id number]) on 05/08/2021 6:31:04 PM  Radiology DG Chest 2 View  Result Date: 05/08/2021 CLINICAL DATA:  Chest pain for 2 hours substernal into RIGHT side of chest, some shortness of breath, history diabetes mellitus, hypertension, NSTEMI EXAM: CHEST - 2 VIEW COMPARISON:  11/27/2018 FINDINGS: Enlargement of cardiac silhouette. Mediastinal contours and pulmonary vascularity normal. Linear subsegmental atelectasis RIGHT lung base. Remaining lungs clear. No infiltrate, pleural effusion, or pneumothorax. Osseous structures unremarkable. IMPRESSION: Enlargement of cardiac silhouette with linear subsegmental atelectasis at RIGHT lung base. Electronically Signed   By: Lavonia Dana M.D.   On: 05/08/2021 17:12    Procedures Procedures   Medications Ordered in ED Medications  hydrALAZINE (APRESOLINE) tablet 50 mg (50 mg Oral Given 05/08/21 2107)    ED Course  I have reviewed the triage vital signs and the nursing notes.  Pertinent labs & imaging results that were available during my care of the patient were reviewed by me  and considered in my medical decision making (see chart for details).    MDM Rules/Calculators/A&P                           ECG shows sinus rhythm no ST elevations or depressions noted.  Rate is normal.  Labs sent proBNP normal troponin negative.  Second troponin sent several hours later continues to be negative.  Patient remains pain and symptom-free at this time.  Chest x-ray is unremarkable as well.  Blood pressure is elevated given hydralazine with improvement, recommending outpatient follow-up with cardiology within 3 to 4 days, recommend immediate return for worsening symptoms or any additional concerns.   Final Clinical Impression(s) / ED Diagnoses Final diagnoses:  Chest pain, unspecified type    Rx / DC Orders ED Discharge Orders     None        Luna Fuse, MD 05/08/21 2233

## 2021-05-08 NOTE — ED Provider Notes (Signed)
Emergency Medicine Provider Triage Evaluation Note  Courtney Stewart , a 50 y.o. female  was evaluated in triage.  Pt complains of chest pain began approximately 2 hours PTA.  Located substernally into the right side of her chest.  Some mild shortness of breath.  No cough, lower extremity edema.  Has a history of CHF.  No fever, emesis, back pain.  Did recently have a hemorrhagic stroke.  Followed by [facility] cardiology. Feels fatigued.  Review of Systems  Positive: Chest pain, shortness of Negative: Fever, emesis, back pain  Physical Exam  BP (!) 187/94 (BP Location: Right Arm)   Pulse 73   Temp 99.4 F (37.4 C) (Oral)   Resp 18   Ht 5\' 2"  (1.575 m)   Wt 111 kg   LMP  (LMP Unknown)   SpO2 99%   BMI 44.76 kg/m  Gen:   Awake, no distress   Resp:  Normal effort  MSK:   Moves extremities without difficulty, no le edema Other:    Medical Decision Making  Medically screening exam initiated at 4:53 PM.  Appropriate orders placed.  Courtney Stewart was informed that the remainder of the evaluation will be completed by another provider, this initial triage assessment does not replace that evaluation, and the importance of remaining in the ED until their evaluation is complete.  CP   Hilary Pundt A, PA-C 05/08/21 1655    05/10/21, MD 05/08/21 2106

## 2021-05-08 NOTE — ED Notes (Signed)
E-signature pad unavailable at time of pt discharge. This RN discussed discharge materials with pt and answered all pt questions. Pt stated understanding of discharge material. ? ?

## 2021-05-08 NOTE — ED Notes (Addendum)
Warm blankets and graham crackers given to pt

## 2021-05-08 NOTE — Discharge Instructions (Signed)
Call your primary care doctor or specialist as discussed in the next 2-3 days.   Return immediately back to the ER if:  Your symptoms worsen within the next 12-24 hours. You develop new symptoms such as new fevers, persistent vomiting, new pain, shortness of breath, or new weakness or numbness, or if you have any other concerns.  

## 2021-05-18 ENCOUNTER — Ambulatory Visit: Payer: Self-pay | Attending: Neurology | Admitting: Rehabilitation

## 2021-05-18 ENCOUNTER — Encounter: Payer: Self-pay | Admitting: Rehabilitation

## 2021-05-18 ENCOUNTER — Ambulatory Visit: Payer: Self-pay | Admitting: Occupational Therapy

## 2021-05-18 ENCOUNTER — Other Ambulatory Visit: Payer: Self-pay

## 2021-05-18 DIAGNOSIS — R41844 Frontal lobe and executive function deficit: Secondary | ICD-10-CM | POA: Insufficient documentation

## 2021-05-18 DIAGNOSIS — I619 Nontraumatic intracerebral hemorrhage, unspecified: Secondary | ICD-10-CM | POA: Insufficient documentation

## 2021-05-18 DIAGNOSIS — R41840 Attention and concentration deficit: Secondary | ICD-10-CM | POA: Insufficient documentation

## 2021-05-18 DIAGNOSIS — R2689 Other abnormalities of gait and mobility: Secondary | ICD-10-CM | POA: Insufficient documentation

## 2021-05-18 DIAGNOSIS — M6281 Muscle weakness (generalized): Secondary | ICD-10-CM | POA: Insufficient documentation

## 2021-05-18 DIAGNOSIS — R2681 Unsteadiness on feet: Secondary | ICD-10-CM | POA: Insufficient documentation

## 2021-05-18 DIAGNOSIS — R278 Other lack of coordination: Secondary | ICD-10-CM | POA: Insufficient documentation

## 2021-05-18 NOTE — Therapy (Signed)
[facility] [address] [place], [place], [address] Phone: [phone]   Fax:  [phone]  Physical Therapy Evaluation  Patient Details  Name: Courtney Stewart MRN: [id number] Date of Birth: 1970/08/01 Referring Provider (PT): Antony Contras, MD   Encounter Date: 05/18/2021   PT End of Session - 05/18/21 0940     Visit Number 1    Number of Visits 9    Authorization Type MCD pending    PT Start Time 0845    PT Stop Time 0931    PT Time Calculation (min) 46 min    Activity Tolerance Patient tolerated treatment well    Behavior During Therapy [facility] for tasks assessed/performed             Past Medical History:  Diagnosis Date   Anemia    Diabetes mellitus without complication ([place])    Hypertension    NSTEMI (non-ST elevated myocardial infarction) ([place])    UC (ulcerative colitis) ([place])     Past Surgical History:  Procedure Laterality Date   CARDIAC CATHETERIZATION N/A 12/22/2015   Procedure: Left Heart Cath and Coronary Angiography;  Surgeon: Adrian Prows, MD;  Location: [facility];  Service: Cardiovascular;  Laterality: N/A;   CESAREAN SECTION      There were no vitals filed for this visit.    Subjective Assessment - 05/18/21 0850     Subjective "Some days my left knee doesn't want to work, it feels really stiff.  I am having trouble with my balance when my knee is stiff.    Pertinent History CVA 11/4, hx of HTN, DM, HLD, CKD and CHF    Limitations Standing;Walking;House hold activities    How long can you stand comfortably? 15 mins    How long can you walk comfortably? 15 mins    Patient Stated Goals "I want to be able to drive."    Currently in Pain? Yes    Pain Score 3     Pain Location Knee    Pain Orientation Left    Pain Descriptors / Indicators Tightness    Pain Type Acute pain;Chronic pain    Pain Onset 1 to 4 weeks ago    Pain Frequency Intermittent    Aggravating Factors  when she  wakes up    Pain Relieving Factors moving                [facility] PT Assessment - 05/18/21 0856       Assessment   Medical Diagnosis CVA    Referring Provider (PT) Antony Contras, MD    Onset Date/Surgical Date 04/15/21    Hand Dominance Right    Prior Therapy Acute PT/OT      Balance Screen   Has the patient fallen in the past 6 months No    Has the patient had a decrease in activity level because of a fear of falling?  No    Is the patient reluctant to leave their home because of a fear of falling?  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[employer], hopes to return (worked full time, days)    Leisure Get out of the house      Cognition   Overall Cognitive Status Impaired/Different from baseline    Area of Impairment Attention;Memory    Memory Decreased short-term memory      Sensation   Light Touch Impaired Detail    Light Touch Impaired Details Impaired LLE   light touch intact, but duller   Hot/Cold Appears Intact      Coordination   Gross Motor Movements are Fluid and Coordinated Yes    Fine Motor Movements are Fluid and Coordinated Yes    Heel Shin Test WFL      ROM / Strength   AROM / PROM / Strength Strength      Strength   Overall Strength Deficits    Overall Strength Comments L hip flex 3+/5, B knee flex 3+/5, all others grossly 4/5.  Limited hip flex ROM due to body habitus as it as tested in sitting      Transfers   Transfers Sit to Stand;Stand to Sit    Sit to Stand 6: Modified independent  (Device/Increase time)    Five time sit to stand comments  16.46 secs without UE support from low mat in treatment room    Stand to Sit 6: Modified independent (Device/Increase time)      Ambulation/Gait   Ambulation/Gait Yes    Ambulation/Gait Assistance 5: Supervision    Ambulation/Gait Assistance Details S for safety, esp during balance challenges with FGA    Ambulation Distance (Feet) 300 Feet    Assistive device None    Gait Pattern Step-through pattern;Decreased stride length;Trunk flexed;Wide base of support    Ambulation Surface Level;Indoor    Gait velocity 1.98 ft/sec without device    Stairs Yes    Stairs Assistance 4: Min guard    Stair Management Technique Two rails;Step to pattern;Forwards    Number of Stairs 4    Height of Stairs 6      6 Minute walk- Post Test   6 Minute Walk Post Test yes      Functional Gait  Assessment   Gait assessed  Yes    Gait Level Surface Walks 20 ft, slow speed, abnormal gait pattern, evidence for imbalance or deviates 10-15 in outside of the 12 in walkway width. Requires more than 7 sec to ambulate 20 ft.   10 secs   Change in Gait Speed Makes only minor adjustments to walking speed, or accomplishes a change in speed with significant gait deviations, deviates 10-15 in outside the 12 in walkway width, or changes speed but loses balance but is able to recover and continue walking.    Gait with Horizontal Head Turns Performs head turns smoothly with no change in gait. Deviates no more than 6 in outside 12 in walkway width   is normal based on her baseline gait speed   Gait with Vertical Head Turns Performs head turns with no change in gait. Deviates no more than 6 in outside 12 in walkway width.   is normal based on her baseline gait speed   Gait and Pivot Turn Pivot turns safely within 3 sec and stops quickly with no loss of balance.    Step Over Obstacle Is able to step over one shoe box (4.5 in total height) without changing  gait speed. No  evidence of imbalance.    Gait with Narrow Base of Support Ambulates less than 4 steps heel to toe or cannot perform without assistance.    Gait with Eyes Closed Walks 20 ft, uses assistive device, slower speed, mild gait deviations, deviates 6-10 in outside 12 in walkway width. Ambulates 20 ft in less than 9 sec but greater than 7 sec.    Ambulating Backwards Walks 20 ft, slow speed, abnormal gait pattern, evidence for imbalance, deviates 10-15 in outside 12 in walkway width.    Steps Two feet to a stair, must use rail.    Total Score 17    FGA comment: < 19 = high risk fall                        Objective measurements completed on examination: See above findings.                PT Education - 05/18/21 0940     Education Details Education on evaluation results, POC, goals    Person(s) Educated Patient    Methods Explanation    Comprehension Verbalized understanding              PT Short Term Goals - 05/18/21 0948       PT SHORT TERM GOAL #1   Title Pt will initiate HEP in order to indicate improved functional mobility and dec fall risk.  (Target Date:  Following 3rd visit)    Baseline no HEP at this time    Time 1    Period Months    Status New      PT SHORT TERM GOAL #2   Title Pt will improve gait speed to >/=2.28 ft/sec in order to indicate improving community gait.    Baseline 1.98 ft/sec    Time 1    Period Months    Status New      PT SHORT TERM GOAL #3   Title Pt will negotiate up/down stairs with single rail in alternating fashion in order to indicate improved strength.    Baseline 4 with B rails in step to pattern    Time 1    Period Months    Status New               PT Long Term Goals - 05/18/21 0950       PT LONG TERM GOAL #1   Title Pt will  be IND with final HEP in order to indicate improved functional mobility and dec fall risk.  (Following 6th visit)    Baseline no HEP at this time    Time 2    Period Months     Status New      PT LONG TERM GOAL #2   Title Pt will improve FGA to >/=22/30 in order to indicate dec fall risk.    Baseline 17/30    Time 2    Period Months    Status New      PT LONG TERM GOAL #3   Title Pt will improve gait speed to >/=2.62 ft/sec in order to indicate safe community mobility.    Baseline 1.98 ft/sec    Time 2    Period Months    Status New      PT LONG TERM GOAL #4   Title Pt will ambulate x 500' over varying outdoor surfaces at mod I level in order to indicate safe community mobility.  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 Note history of HTN, DM, HLD, CKD, and CHF.  Upon PT evaluation, note that gait speed is 1.98 ft/sec without device which is indicative of limited community ambulator, 5TSS time of 16.46 secs indicative of elevated fall risk and decreased functional strength, and FGA score of 17/30 which is indicative of high fall risk.  Pt is mostly limited due to slower gait speed in general and with balance challenges.  Feel she will benefit from short episode of care to address deficits.    Personal Factors and Comorbidities Comorbidity 3+;Transportation;Finances    Comorbidities see above    Examination-Activity Limitations Locomotion Level;Stand;Stairs;Squat;Transfers    Examination-Participation Restrictions Community Activity;Driving;Personal Finances;Occupation    Stability/Clinical Decision Making Evolving/Moderate complexity    Clinical Decision  Making Moderate    Rehab Potential Good    PT Frequency 1x / week   1x/wk for 8 weeks as MCD is pending   PT Duration 8 weeks    PT Treatment/Interventions ADLs/Self Care Home Management;DME Instruction;Gait training;Stair training;Functional mobility training;Therapeutic activities;Therapeutic exercise;Balance training;Neuromuscular re-education;Patient/family education;Vestibular;Passive range of motion    PT Next Visit Plan Provide HEP for high level balance, balance strategies, work on increasing gait speed (treadmill??), functional strengthening    Consulted and Agree with Plan of Care Patient             Patient will benefit from skilled therapeutic intervention in order to improve the following deficits and impairments:  Decreased activity tolerance, Decreased balance, Decreased mobility, Impaired flexibility, Postural dysfunction, Impaired sensation  Visit Diagnosis: Unsteadiness on feet  Muscle weakness (generalized)  Other abnormalities of gait and mobility     Problem List Patient Active Problem List   Diagnosis Date Noted   ICH (intracerebral hemorrhage) ([place]) 04/15/2021   Personal history of noncompliance with medical treatment, presenting hazards to health 11/16/2016   NSTEMI (non-ST elevated myocardial infarction) ([place]) 12/21/2015   Morbid obesity ([place]) 08/12/2015   Diabetes mellitus without complication ([place]) 0000000   Essential hypertension 08/12/2015    Cameron Sprang, PT, MPT [facility] [address] [place], [place], [address] Phone: [phone]   Fax:  [phone] 05/18/21, 9:55 AM   Name: Courtney Stewart MRN: [id number] Date of Birth: 1970/07/23

## 2021-05-19 ENCOUNTER — Encounter: Payer: Self-pay | Admitting: Occupational Therapy

## 2021-05-23 ENCOUNTER — Other Ambulatory Visit: Payer: Self-pay

## 2021-05-23 ENCOUNTER — Encounter: Payer: Self-pay | Admitting: Occupational Therapy

## 2021-05-23 ENCOUNTER — Ambulatory Visit: Payer: Self-pay | Admitting: Occupational Therapy

## 2021-05-23 DIAGNOSIS — R41844 Frontal lobe and executive function deficit: Secondary | ICD-10-CM

## 2021-05-23 DIAGNOSIS — R278 Other lack of coordination: Secondary | ICD-10-CM

## 2021-05-23 DIAGNOSIS — M6281 Muscle weakness (generalized): Secondary | ICD-10-CM

## 2021-05-23 DIAGNOSIS — R41840 Attention and concentration deficit: Secondary | ICD-10-CM

## 2021-05-23 DIAGNOSIS — R2689 Other abnormalities of gait and mobility: Secondary | ICD-10-CM

## 2021-05-23 DIAGNOSIS — R2681 Unsteadiness on feet: Secondary | ICD-10-CM

## 2021-05-23 NOTE — Therapy (Signed)
[facility] [address] [place], [place], [address] Phone: [phone]   Fax:  [phone]  Occupational Therapy Evaluation  Patient Details  Name: Marcile Omura MRN: [id number] Date of Birth: [birth year] Referring Provider (OT): Leonie Man   Encounter Date: 05/23/2021   OT End of Session - 05/23/21 1150     Visit Number 1    Number of Visits 9    Date for OT Re-Evaluation 08/01/21   8 visits over 10 weeks d/t any scheduling conflicts   Authorization Type Self Pay - Medicaid Pending    OT Start Time 0845    OT Stop Time 0930    OT Time Calculation (min) 45 min    Activity Tolerance Patient tolerated treatment well;Patient limited by lethargy;Patient limited by fatigue    Behavior During Therapy [facility] for tasks assessed/performed   pt extremely fatigued and lethargic            Past Medical History:  Diagnosis Date   Anemia    Diabetes mellitus without complication ([place])    Hypertension    NSTEMI (non-ST elevated myocardial infarction) ([place])    UC (ulcerative colitis) ([place])     Past Surgical History:  Procedure Laterality Date   CARDIAC CATHETERIZATION N/A 12/22/2015   Procedure: Left Heart Cath and Coronary Angiography;  Surgeon: Adrian Prows, MD;  Location: [facility];  Service: Cardiovascular;  Laterality: N/A;   CESAREAN SECTION      There were no vitals filed for this visit.   Subjective Assessment - 05/23/21 0918     Subjective  Pt is a 50 year old female that was admitted on 04/15/21 for headache and left sided weakness, slurred speech. CT revealed R basal ganglia ICH. Pt PMH significant for anemia, DM, HTN, NSTEMI, ulcerative colitis. Pt reports extreme fatigue as her number one complaint and reports difficulty focusing, reporting, "feels like I have ADHD". Pt reports her goal is "to get back driving". Pt denies any visual changes. 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    Pertinent History Anemia, DM, HTN, NSTEMI, ulcerative colitis    Limitations [facility] Transportation, Fall Risk, mild left weakness    Patient Stated Goals "get back to driving"    Currently in Pain? Yes    Pain Score 8    reports as high as 10/10 at night   Pain Location Back    Pain Orientation Lower    Pain Descriptors / Indicators Aching    Pain Type Acute pain    Pain Onset 1 to 4 weeks ago    Pain Frequency Intermittent    Aggravating Factors  at night               [facility] OT Assessment - 05/23/21 0856       Assessment   Medical Diagnosis CVA    Referring Provider (OT) Leonie Man    Onset Date/Surgical Date 04/15/21    Hand Dominance Right    Prior Therapy Acute PT/OT      Precautions   Precautions Fall      Balance Screen   Has the patient fallen in the past 6 months No      Home  Environment   Family/patient expects to be discharged to: Private residence    Living Arrangements Children   daughter and granddaughter   Available Help at Discharge Family  Type of Home Aartment    Home Layout Able to live on main level with bedroom/bathroom    Bathroom Shower/Tub Tub/Shower unit    Home Equipment Grab bars - tub/shower      Prior Function   Level of Independence Independent    Vocation Full time employment    Teacher, musicVocation Requirements janitor    Leisure watch TV      ADL   Eating/Feeding Modified independent    Grooming Modified independent    Upper Body Bathing Modified independent    Lower Body Bathing Modified independent    Upper Body Dressing Independent    Lower Body Dressing Modified independent    Toilet Transfer Modified independent    Toileting - Clothing Manipulation Modified independent    Toileting -  Hygiene Modified Independent    Tub/Shower Transfer Modified independent      IADL   Prior Level of Function Shopping was completing prior    Shopping Completely unable  to shop    Light Housekeeping Does personal laundry completely;Launders small items, rinses stockings, etc.;Maintains house alone or with occasional assistance    Meal Prep Plans, prepares and serves adequate meals independently;Prepares adequate meal if supplied with ingredients    Community Mobility Relies on family or friends for transportation    Medication Management Is responsible for taking medication in correct dosages at correct time    Financial Management Requires assistance      Written Expression   Dominant Hand Right      Vision - History   Baseline Vision No visual deficits    Additional Comments denies any changes      Activity Tolerance   Activity Tolerance Tolerates < 10 min activity with changes in vital signs   did not check vitals at eval but pt has BP monitoring in note <160   Activity Tolerance Comments pt asleep upon retrieving from lobby, report extreme fatigue      Cognition   Area of Impairment Attention;Memory;Awareness    Attention Comments Trail Making A good, Trail Making B with increased time > 60 secondds and with mod difficulty. 88's cancellation with 85% accuracy      Sensation   Light Touch Appears Intact    Hot/Cold Appears Intact      Coordination   9 Hole Peg Test Right;Left    Right 9 Hole Peg Test 23.54s    Left 9 Hole Peg Test 33.47s    Box and Blocks R 41, L 41      ROM / Strength   AROM / PROM / Strength AROM;Strength      AROM   Overall AROM  Within functional limits for tasks performed      Strength   Overall Strength Deficits    Overall Strength Comments RUE WFL, LUE shoulder flex with min/mod resistance      Hand Function   Right Hand Gross Grasp Functional    Right Hand Grip (lbs) 40.3    Left Hand Gross Grasp Functional    Left Hand Grip (lbs) 29.3                                   OT Long Term Goals - 05/23/21 1159       OT LONG TERM GOAL #1   Title Pt will be independent with HEP     Baseline would benefit from coordination and grip strength, proximal strength  Time 10    Period Weeks    Status New    Target Date 08/01/21      OT LONG TERM GOAL #2   Title Pt will perform physical and cognitive task simultaneously with 90% accuracy or greater    Time 10    Period Weeks    Status New      OT LONG TERM GOAL #3   Title Pt will increase grip strength by at least 5 lbs in LUE.    Baseline R 40.3, L 29.3    Time 10    Period Weeks    Status New      OT LONG TERM GOAL #4   Title Pt will complete 9 hole peg test with LUE in 5 seconds faster than at evaluation    Baseline R 23.54s, L 33.47s    Time 10    Period Weeks    Status New      OT LONG TERM GOAL #5   Title Pt will verbalize understanding of adapted strategies and/or equipment for increase safety and independence with ADLs and IADLs.    Time 10    Period Weeks    Status New      Long Term Additional Goals   Additional Long Term Goals Yes      OT LONG TERM GOAL #6   Title Pt will increase activity tolerance by completed 15 minutes or more of home management or cooking tasks per report or in clinic assessment without req'ing seated rest break.    Time 10    Period Weeks    Status New                   Plan - 05/23/21 1154     Clinical Impression Statement Pt is a 50 year old female that was admitted on 04/15/21 for headache and left sided weakness, slurred speech. CT revealed R basal ganglia ICH. Pt PMH significant for anemia, DM, HTN, NSTEMI, ulcerative colitis. Pt presents to evaluation today with LUE mild weakness and decreased coordination, unsteadiness on feet and deficits with alternating attention and awareness.  Skilled occupational therapy is recommended to target listed areas of deficit and increase independence with ADLs and IADLs.    OT Occupational Profile and History Problem Focused Assessment - Including review of records relating to presenting problem    Occupational performance  deficits (Please refer to evaluation for details): ADL's;IADL's;Leisure;Work    Games developer / Function / Physical Skills FMC;Coordination;Endurance;Strength;ADL;Decreased knowledge of use of DME;Balance;Dexterity;GMC;Pain;UE functional use;IADL    Cognitive Skills Attention;Safety Awareness;Memory    Rehab Potential Good    Clinical Decision Making Limited treatment options, no task modification necessary    Comorbidities Affecting Occupational Performance: None    Modification or Assistance to Complete Evaluation  No modification of tasks or assist necessary to complete eval    OT Frequency 1x / week    OT Duration Other (comment)   8 visits over 10 weeks d/t any scheduling conflicts   OT Treatment/Interventions Patient/family education;Energy conservation;Functional Mobility Training;Passive range of motion;Therapeutic exercise;Cognitive remediation/compensation;Therapeutic activities;DME and/or AE instruction;Self-care/ADL training;Moist Heat;Neuromuscular education    Plan Coordination HEP, Theraputty HEP, environmental scanning    Consulted and Agree with Plan of Care Patient             Patient will benefit from skilled therapeutic intervention in order to improve the following deficits and impairments:   Body Structure / Function / Physical Skills: [facility], Coordination, Endurance, Strength, ADL, Decreased  knowledge of use of DME, Balance, Dexterity, [place], Pain, UE functional use, IADL Cognitive Skills: Attention, Safety Awareness, Memory     Visit Diagnosis: Muscle weakness (generalized)  Unsteadiness on feet  Other abnormalities of gait and mobility  Other lack of coordination  Attention and concentration deficit  Frontal lobe and executive function deficit    Problem List Patient Active Problem List   Diagnosis Date Noted   ICH (intracerebral hemorrhage) ([place]) 04/15/2021   Personal history of noncompliance with medical treatment, presenting hazards to health  11/16/2016   NSTEMI (non-ST elevated myocardial infarction) ([place]) 12/21/2015   Morbid obesity ([place]) 08/12/2015   Diabetes mellitus without complication ([place]) 0000000   Essential hypertension 08/12/2015    Zachery Conch, OT 05/23/2021, 12:47 PM  [facility] [address] [place], [place], [address] Phone: [phone]   Fax:  [phone]  Name: Brynlee Waldorf MRN: [id number] Date of Birth: Oct 31, 1970

## 2021-05-30 ENCOUNTER — Ambulatory Visit: Payer: Self-pay | Admitting: Occupational Therapy

## 2021-05-30 ENCOUNTER — Encounter: Payer: Self-pay | Admitting: Occupational Therapy

## 2021-05-30 ENCOUNTER — Other Ambulatory Visit: Payer: Self-pay

## 2021-05-30 DIAGNOSIS — R41840 Attention and concentration deficit: Secondary | ICD-10-CM

## 2021-05-30 DIAGNOSIS — M6281 Muscle weakness (generalized): Secondary | ICD-10-CM

## 2021-05-30 DIAGNOSIS — R278 Other lack of coordination: Secondary | ICD-10-CM

## 2021-05-30 DIAGNOSIS — R41844 Frontal lobe and executive function deficit: Secondary | ICD-10-CM

## 2021-05-30 NOTE — Therapy (Signed)
[facility] [address] [place], [place], [address] Phone: [phone]   Fax:  [phone]  Occupational Therapy Treatment  Patient Details  Name: Courtney Stewart MRN: [id number] Date of Birth: [birth year] Referring Provider (OT): Pearlean Brownie   Encounter Date: 05/30/2021   OT End of Session - 05/30/21 1106     Visit Number 2    Number of Visits 9    Date for OT Re-Evaluation 08/01/21   8 visits over 10 weeks d/t any scheduling conflicts   Authorization Type Self Pay - Medicaid Pending    OT Start Time 1105    OT Stop Time 1145    OT Time Calculation (min) 40 min    Activity Tolerance Patient tolerated treatment well;Patient limited by lethargy    Behavior During Therapy [facility] for tasks assessed/performed;Restless   pt extremely fatigued and lethargic            Past Medical History:  Diagnosis Date   Anemia    Diabetes mellitus without complication (HCC)    Hypertension    NSTEMI (non-ST elevated myocardial infarction) (HCC)    UC (ulcerative colitis) (HCC)     Past Surgical History:  Procedure Laterality Date   CARDIAC CATHETERIZATION N/A 12/22/2015   Procedure: Left Heart Cath and Coronary Angiography;  Surgeon: Yates Decamp, MD;  Location: [facility] INVASIVE CV LAB;  Service: Cardiovascular;  Laterality: N/A;   CESAREAN SECTION      There were no vitals filed for this visit.   Subjective Assessment - 05/30/21 1106     Subjective  Pt denies pain.  Pt reports decr decr attention/dtr reminds her to turn off the stove at home (was forgetting initially), pt reports difficulty with focus    Pertinent History Anemia, DM, HTN, NSTEMI, ulcerative colitis    Limitations [facility], Fall Risk, mild left weakness    Patient Stated Goals "get back to driving"    Currently in Pain? No/denies    Pain Onset 1 to 4 weeks ago              While performing coordination activities, pt noted to pause with internal  distractions as well as distracted from environment.  Error in counting exercise due to attention and pt with difficulty refocusing.   Completing 12-piece puzzle with incr time, decr attention noted, but no redirection/cueing needed.   Copying small peg design with incr time and min v.c. to copy, but needed cueing to continue to use L hand.  Environmental scanning to locate 12/15 items.           OT Education - 05/30/21 1111     Education Details Coordination/red putty HEP and cognitive HEP--see pt instructions    Person(s) Educated Patient    Methods Explanation;Demonstration;Verbal cues;Handout    Comprehension Verbalized understanding;Returned demonstration;Verbal cues required                 OT Long Term Goals - 05/23/21 1159       OT LONG TERM GOAL #1   Title Pt will be independent with HEP    Baseline would benefit from coordination and grip strength, proximal strength    Time 10    Period Weeks    Status New    Target Date 08/01/21      OT LONG TERM GOAL #2   Title Pt will perform physical and cognitive task simultaneously with 90% accuracy or greater    Time 10    Period Weeks  Status New      OT LONG TERM GOAL #3   Title Pt will increase grip strength by at least 5 lbs in LUE.    Baseline R 40.3, L 29.3    Time 10    Period Weeks    Status New      OT LONG TERM GOAL #4   Title Pt will complete 9 hole peg test with LUE in 5 seconds faster than at evaluation    Baseline R 23.54s, L 33.47s    Time 10    Period Weeks    Status New      OT LONG TERM GOAL #5   Title Pt will verbalize understanding of adapted strategies and/or equipment for increase safety and independence with ADLs and IADLs.    Time 10    Period Weeks    Status New      Long Term Additional Goals   Additional Long Term Goals Yes      OT LONG TERM GOAL #6   Title Pt will increase activity tolerance by completed 15 minutes or more of home management or cooking tasks per  report or in clinic assessment without req'ing seated rest break.    Time 10    Period Weeks    Status New                   Plan - 05/30/21 1106     Clinical Impression Statement Pt reports difficulty with completing tasks at home with decr attention noted during session (internal distractions/pauses with activity and distracted by environment),    OT Occupational Profile and History Problem Focused Assessment - Including review of records relating to presenting problem    Occupational performance deficits (Please refer to evaluation for details): ADL's;IADL's;Leisure;Work    Games developerBody Structure / Function / Physical Skills FMC;Coordination;Endurance;Strength;ADL;Decreased knowledge of use of DME;Balance;Dexterity;GMC;Pain;UE functional use;IADL    Cognitive Skills Attention;Safety Awareness;Memory    Rehab Potential Good    Clinical Decision Making Limited treatment options, no task modification necessary    Comorbidities Affecting Occupational Performance: None    Modification or Assistance to Complete Evaluation  No modification of tasks or assist necessary to complete eval    OT Frequency 1x / week    OT Duration Other (comment)   8 visits over 10 weeks d/t any scheduling conflicts   OT Treatment/Interventions Patient/family education;Energy conservation;Functional Mobility Training;Passive range of motion;Therapeutic exercise;Cognitive remediation/compensation;Therapeutic activities;DME and/or AE instruction;Self-care/ADL training;Moist Heat;Neuromuscular education    Plan environmental scanning, attention/cognition for functional tasks and strategies/AE for incr safety with ADLs/IADLs    Consulted and Agree with Plan of Care Patient             Patient will benefit from skilled therapeutic intervention in order to improve the following deficits and impairments:   Body Structure / Function / Physical Skills: [facility], Coordination, Endurance, Strength, ADL, Decreased knowledge of  use of DME, Balance, Dexterity, GMC, Pain, UE functional use, IADL Cognitive Skills: Attention, Safety Awareness, Memory     Visit Diagnosis: Other lack of coordination  Muscle weakness (generalized)  Attention and concentration deficit  Frontal lobe and executive function deficit    Problem List Patient Active Problem List   Diagnosis Date Noted   ICH (intracerebral hemorrhage) (HCC) 04/15/2021   Personal history of noncompliance with medical treatment, presenting hazards to health 11/16/2016   NSTEMI (non-ST elevated myocardial infarction) (HCC) 12/21/2015   Morbid obesity (HCC) 08/12/2015   Diabetes mellitus without complication (HCC) 08/12/2015  Essential hypertension 08/12/2015    [facility], OT 05/30/2021, 12:34 PM  [facility] [address] [place], [place], [address] Phone: [phone]   Fax:  [phone]  Name: Courtney Stewart MRN: [id number] Date of Birth: 04-19-1971  Willa Frater, OTR/L [facility] [address] [place], [place]  [address] [phone] phone [phone] 05/30/21 12:34 PM

## 2021-05-30 NOTE — Patient Instructions (Addendum)
° ° °  Coordination Activities  Perform the following activities for 20 minutes 1 times per day with left hand(s).  Rotate ball in fingertips (clockwise and counter-clockwise). Toss ball in air and catch with the same hand. Flip cards 1 at a time as fast as you can. Deal cards with your thumb (Hold deck in hand and push card off top with thumb). Pick up coins and stack. Pick up coins one at a time until you get 5-10 in your hand, then move coins from palm to fingertips to place in coin bank/container one at a time. Squeeze putty with your whole hand x20 Roll out putty and squeeze with each finger/thumb down length of putty.  Roll out 2-3 times      Activities for Cognition:  1. Jigsaw puzzles 2. Card/board games 3. Talking on the phone 4. Online games 6.  Word serches/crossword puzzles/Sudoku 7.  Crafts 8.  Make a list of tasks to complete and check things off as you go 9.  Plan out meals for the week and write a grocery list. 10.  Read, discuss what you've read with someone else

## 2021-06-07 ENCOUNTER — Ambulatory Visit: Payer: Self-pay | Admitting: Occupational Therapy

## 2021-06-09 ENCOUNTER — Emergency Department (HOSPITAL_BASED_OUTPATIENT_CLINIC_OR_DEPARTMENT_OTHER): Payer: Self-pay

## 2021-06-09 ENCOUNTER — Other Ambulatory Visit: Payer: Self-pay

## 2021-06-09 ENCOUNTER — Encounter (HOSPITAL_BASED_OUTPATIENT_CLINIC_OR_DEPARTMENT_OTHER): Payer: Self-pay | Admitting: Emergency Medicine

## 2021-06-09 ENCOUNTER — Emergency Department (HOSPITAL_BASED_OUTPATIENT_CLINIC_OR_DEPARTMENT_OTHER)
Admission: EM | Admit: 2021-06-09 | Discharge: 2021-06-09 | Disposition: A | Discharge status: Home / Self Care | Payer: Self-pay | Attending: Emergency Medicine | Admitting: Emergency Medicine

## 2021-06-09 DIAGNOSIS — R5383 Other fatigue: Secondary | ICD-10-CM | POA: Insufficient documentation

## 2021-06-09 DIAGNOSIS — Z91040 Latex allergy status: Secondary | ICD-10-CM | POA: Insufficient documentation

## 2021-06-09 DIAGNOSIS — Z91010 Allergy to peanuts: Secondary | ICD-10-CM | POA: Insufficient documentation

## 2021-06-09 DIAGNOSIS — I10 Essential (primary) hypertension: Secondary | ICD-10-CM | POA: Insufficient documentation

## 2021-06-09 DIAGNOSIS — Z7984 Long term (current) use of oral hypoglycemic drugs: Secondary | ICD-10-CM | POA: Insufficient documentation

## 2021-06-09 DIAGNOSIS — E119 Type 2 diabetes mellitus without complications: Secondary | ICD-10-CM | POA: Insufficient documentation

## 2021-06-09 DIAGNOSIS — R251 Tremor, unspecified: Secondary | ICD-10-CM | POA: Insufficient documentation

## 2021-06-09 DIAGNOSIS — Z79899 Other long term (current) drug therapy: Secondary | ICD-10-CM | POA: Insufficient documentation

## 2021-06-09 DIAGNOSIS — Z20822 Contact with and (suspected) exposure to covid-19: Secondary | ICD-10-CM | POA: Insufficient documentation

## 2021-06-09 LAB — RESP PANEL BY RT-PCR (FLU A&B, COVID) ARPGX2
Influenza A by PCR: NEGATIVE
Influenza B by PCR: NEGATIVE
SARS Coronavirus 2 by RT PCR: NEGATIVE

## 2021-06-09 LAB — COMPREHENSIVE METABOLIC PANEL
ALT: 12 U/L (ref 0–44)
AST: 12 U/L — ABNORMAL LOW (ref 15–41)
Albumin: 3.7 g/dL (ref 3.5–5.0)
Alkaline Phosphatase: 53 U/L (ref 38–126)
Anion gap: 7 (ref 5–15)
BUN: 14 mg/dL (ref 6–20)
CO2: 29 mmol/L (ref 22–32)
Calcium: 9 mg/dL (ref 8.9–10.3)
Chloride: 98 mmol/L (ref 98–111)
Creatinine, Ser: 1.01 mg/dL — ABNORMAL HIGH (ref 0.44–1.00)
GFR, Estimated: >60 mL/min (ref >60)
Glucose, Bld: 252 mg/dL — ABNORMAL HIGH (ref 70–99)
Potassium: 3.7 mmol/L (ref 3.5–5.1)
Sodium: 134 mmol/L — ABNORMAL LOW (ref 135–145)
Total Bilirubin: 0.2 mg/dL — ABNORMAL LOW (ref 0.3–1.2)
Total Protein: 7.4 g/dL (ref 6.5–8.1)

## 2021-06-09 LAB — URINALYSIS, ROUTINE W REFLEX MICROSCOPIC
Bilirubin Urine: NEGATIVE
Glucose, UA: NEGATIVE mg/dL
Hgb urine dipstick: NEGATIVE
Ketones, ur: NEGATIVE mg/dL
Nitrite: NEGATIVE
Protein, ur: NEGATIVE mg/dL
Specific Gravity, Urine: 1.009 (ref 1.005–1.030)
pH: 7 (ref 5.0–8.0)

## 2021-06-09 LAB — CBC WITH DIFFERENTIAL/PLATELET
Abs Immature Granulocytes: 0.04 10*3/uL (ref 0.00–0.07)
Basophils Absolute: 0 10*3/uL (ref 0.0–0.1)
Basophils Relative: 0 %
Eosinophils Absolute: 0.3 10*3/uL (ref 0.0–0.5)
Eosinophils Relative: 4 %
HCT: 38.9 % (ref 36.0–46.0)
Hemoglobin: 12.6 g/dL (ref 12.0–15.0)
Immature Granulocytes: 1 %
Lymphocytes Relative: 32 %
Lymphs Abs: 2.2 10*3/uL (ref 0.7–4.0)
MCH: 28.7 pg (ref 26.0–34.0)
MCHC: 32.4 g/dL (ref 30.0–36.0)
MCV: 88.6 fL (ref 80.0–100.0)
Monocytes Absolute: 0.5 10*3/uL (ref 0.1–1.0)
Monocytes Relative: 8 %
Neutro Abs: 3.8 10*3/uL (ref 1.7–7.7)
Neutrophils Relative %: 55 %
Platelets: 321 10*3/uL (ref 150–400)
RBC: 4.39 MIL/uL (ref 3.87–5.11)
RDW: 12.9 % (ref 11.5–15.5)
WBC: 6.9 10*3/uL (ref 4.0–10.5)
nRBC: 0 % (ref 0.0–0.2)

## 2021-06-09 LAB — CBG MONITORING, ED: Glucose-Capillary: 215 mg/dL — ABNORMAL HIGH (ref 70–99)

## 2021-06-09 NOTE — ED Provider Notes (Signed)
[place] EMERGENCY DEPT Provider Note   CSN: [id number] Arrival date & time: 06/09/21  1512     History Chief Complaint  Patient presents with   Fatigue    Courtney Stewart is a 50 y.o. female.  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  Patient states that this morning she woke up and her "whole body was shaking."  She describes it as her arms and legs shaking as she was waking up from sleep.  She states that she talked with her daughter about this who recommended her present to the emergency department.  She states that she remembers the entire event.  Denies biting her lip or incontinence during the event.  Additionally she notes that she had sharp right-sided headache earlier today and has felt fatigued all day yesterday into today.  She denies any fevers, focal weakness, nausea or vomiting, urinary symptoms, abdominal pain, chest pain or shortness of breath.  Of note she was recently admitted on 04/15/2021 for hemorrhagic infarct secondary to Eliquis.  She was reversed on admission and discharged without anticoagulation.   HPI     Past Medical History:  Diagnosis Date   Anemia    Diabetes mellitus without complication ([employer])    Hypertension    NSTEMI (non-ST elevated myocardial infarction) ([place])    UC (ulcerative colitis) ([place])     Patient Active Problem List   Diagnosis Date Noted   ICH (intracerebral hemorrhage) ([place]) 04/15/2021   Personal history of noncompliance with medical treatment, presenting hazards to health 11/16/2016   NSTEMI (non-ST elevated myocardial infarction) ([place]) 12/21/2015   Morbid obesity ([place]) 08/12/2015   Diabetes mellitus without complication ([place]) 0000000   Essential hypertension 08/12/2015    Past Surgical History:  Procedure Laterality Date   CARDIAC CATHETERIZATION N/A 12/22/2015    Procedure: Left Heart Cath and Coronary Angiography;  Surgeon: Adrian Prows, MD;  Location: [place] CV LAB;  Service: Cardiovascular;  Laterality: N/A;   CESAREAN SECTION       OB History   No obstetric history on file.     Family History  Problem Relation Age of Onset   Diabetes Mother    Hyperlipidemia Mother    Hypertension Mother    Kidney disease Mother    Colon cancer Father     Social History   Tobacco Use   Smoking status: Never   Smokeless tobacco: Never  Substance Use Topics   Alcohol use: No    Alcohol/week: 0.0 standard drinks   Drug use: No    Home Medications Prior to Admission medications   Medication Sig Start Date End Date Taking? Authorizing Provider  canagliflozin (INVOKANA) 300 MG TABS tablet Take 300 mg by mouth daily before breakfast.    [provider]  carvedilol (COREG) 6.25 MG tablet Take 1 tablet (6.25 mg total) by mouth 2 (two) times daily with a meal. 04/19/21   Dennison Mascot, PA-C  cholecalciferol (VITAMIN D3) 25 MCG (1000 UT) tablet Take 1,000 Units by mouth daily.    [provider]  clobetasol cream (TEMOVATE) [id number] % Apply 1 application topically 2 (two) times daily. Apply to thicker itchy areas of eczema daily until smooth.    [provider]  glipiZIDE (GLUCOTROL) 5 MG tablet Take 1 tablet (5 mg total) by mouth daily before breakfast. 11/13/18   Scot Jun, FNP  glucose blood test strip  Test 4-5 times a week before eating and 3-4 times a week 2 hours after eating. Pt uses- one touch verio IQ meter Patient taking differently: 1 each by Other route See admin instructions. Test 4-5 times a week before eating and 3-4 times a week 2 hours after eating. Pt uses- one touch verio IQ meter 08/24/15   Henson, Vickie L, PA-C  hydrALAZINE (APRESOLINE) 50 MG tablet Take 1 tablet (50 mg total) by mouth every 8 (eight) hours. 04/19/21   Dennison Mascot, PA-C  hydrOXYzine (ATARAX/VISTARIL) 25 MG tablet Take 25-50 mg by  mouth at bedtime as needed for itching (sleep).    [provider]  Magnesium 250 MG TABS Take 250 mg by mouth daily.    [provider]  metFORMIN (GLUCOPHAGE) 1000 MG tablet Take 1,000 mg by mouth 2 (two) times daily with a meal.    [provider]  ONETOUCH DELICA LANCETS FINE MISC TEST 4 TO 5 TMES A WEEK BEFORE EATING AND 3 TO 4 TIMES A WEEK, 2 HOURS AFTER EATING Patient taking differently: 1 each by Other route See admin instructions. TEST 4 TO 5 TMES A WEEK BEFORE EATING AND 3 TO 4 TIMES A WEEK, 2 HOURS AFTER EATING 08/24/15   Henson, Vickie L, PA-C  sacubitril-valsartan (ENTRESTO) 97-103 MG Take 1 tablet by mouth 2 (two) times daily.    [provider]  torsemide (DEMADEX) 20 MG tablet Take 60 mg by mouth 2 (two) times a day. 12/11/18 04/16/21  [provider]  triamcinolone (KENALOG) 0.025 % ointment Apply 1 application topically 2 (two) times daily. Do not apply to face 12/26/18   [name], MD    Allergies    Latex, Peanut oil, Tomato, Amlodipine, Hydrochlorothiazide w-triamterene, Metoprolol, Oxycodone, and Shellfish-derived products  Review of Systems   Review of Systems  Constitutional:  Negative for fever.  HENT:  Negative for sore throat.   Eyes:  Negative for photophobia and visual disturbance.  Respiratory:  Negative for shortness of breath.   Cardiovascular:  Negative for chest pain.  Gastrointestinal:  Negative for abdominal pain and vomiting.  Genitourinary:  Negative for dysuria.  Musculoskeletal:  Negative for gait problem.  Neurological:  Positive for tremors and headaches. Negative for dizziness, seizures, speech difficulty, weakness, light-headedness and numbness.  All other systems reviewed and are negative.  Physical Exam Updated Vital Signs BP (!) 178/90 (BP Location: Right Arm)    Pulse (!) 59    Ht 5\' 2"  (1.575 m)    Wt 111 kg    SpO2 99%    BMI 44.76 kg/m   Physical Exam Vitals and nursing note reviewed.   Constitutional:      General: She is not in acute distress.    Appearance: Normal appearance. She is obese. She is not ill-appearing or toxic-appearing.  HENT:     Head: Normocephalic and atraumatic.     Nose: Nose normal.     Mouth/Throat:     Mouth: Mucous membranes are moist.     Pharynx: Oropharynx is clear.  Eyes:     General: No visual field deficit or scleral icterus.    Extraocular Movements: Extraocular movements intact.     Conjunctiva/sclera: Conjunctivae normal.     Pupils: Pupils are equal, round, and reactive to light.  Cardiovascular:     Rate and Rhythm: Normal rate and regular rhythm.     Pulses: Normal pulses.     Heart sounds: Normal heart sounds. No murmur heard. Pulmonary:  Effort: Pulmonary effort is normal. No respiratory distress.     Breath sounds: Normal breath sounds.  Abdominal:     General: Bowel sounds are normal. There is no distension.     Palpations: Abdomen is soft.     Tenderness: There is no abdominal tenderness.  Musculoskeletal:        General: Normal range of motion.     Cervical back: Normal range of motion and neck supple. No rigidity or tenderness.  Skin:    General: Skin is warm and dry.     Capillary Refill: Capillary refill takes less than 2 seconds.  Neurological:     General: No focal deficit present.     Mental Status: She is alert and oriented to person, place, and time. Mental status is at baseline.     Cranial Nerves: No cranial nerve deficit, dysarthria or facial asymmetry.     Sensory: No sensory deficit.     Motor: Weakness present. No tremor or pronator drift.     Coordination: Coordination is intact.     Comments: 4/5 strength in left upper and left lower extremity.  She states this is residual from recent stroke  Psychiatric:        Mood and Affect: Mood normal.        Behavior: Behavior normal.        Thought Content: Thought content normal.        Judgment: Judgment normal.    ED Results / Procedures /  Treatments   Labs (all labs ordered are listed, but only abnormal results are displayed) Labs Reviewed  COMPREHENSIVE METABOLIC PANEL - Abnormal; Notable for the following components:      Result Value   Sodium 134 (*)    Glucose, Bld 252 (*)    Creatinine, Ser 1.01 (*)    AST 12 (*)    Total Bilirubin 0.2 (*)    All other components within normal limits  URINALYSIS, ROUTINE W REFLEX MICROSCOPIC - Abnormal; Notable for the following components:   Color, Urine COLORLESS (*)    Leukocytes,Ua MODERATE (*)    All other components within normal limits  CBG MONITORING, ED - Abnormal; Notable for the following components:   Glucose-Capillary 215 (*)    All other components within normal limits  RESP PANEL BY RT-PCR (FLU A&B, COVID) [id number]  CBC WITH DIFFERENTIAL/PLATELET    EKG None  Radiology CT Head Wo Contrast  Result Date: 06/09/2021 CLINICAL DATA:  Hemorrhagic stroke 1 month prior. Fatigue and mental status change. EXAM: CT HEAD WITHOUT CONTRAST TECHNIQUE: Contiguous axial images were obtained from the base of the skull through the vertex without intravenous contrast. COMPARISON:  04/19/2021 head CT. FINDINGS: Brain: Crescent-shaped hypodense region in the right internal capsule, representing the residual sequela of hemorrhagic infarct in this location on prior head CT, with resolved hyperdensity and no significant residual mass-effect. No evidence of parenchymal hemorrhage or extra-axial fluid collection. No midline shift. No CT evidence of acute interval infarction. Nonspecific moderate frontal lobe subcortical and periventricular white matter hypodensity. No ventriculomegaly. Vascular: No acute abnormality. Skull: No evidence of calvarial fracture. Sinuses/Orbits: The visualized paranasal sinuses are essentially clear. Other:  The mastoid air cells are unopacified. IMPRESSION: 1. No evidence of acute interval intracranial abnormality. 2. Expected evolution of subacute to chronic right  internal capsule hemorrhagic infarct, with residual crescent shaped hypodensity and no significant residual mass-effect. 3. Nonspecific scattered moderate frontal lobe foci of subcortical and periventricular white matter hypodensity, unchanged. Electronically Signed  By: Ilona Sorrel M.D.   On: 06/09/2021 18:32    Procedures Procedures   Medications Ordered in ED Medications - No data to display  ED Course  I have reviewed the triage vital signs and the nursing notes.  Pertinent labs & imaging results that were available during my care of the patient were reviewed by me and considered in my medical decision making (see chart for details).    MDM Rules/Calculators/A&P 50 year old female who presents to the emergency department with body shaking this morning.  On physical exam there is no evidence of focal neurological deficits.  She has residual left-sided weakness from stroke that she states is unchanged. Presentation is not consistent with seizure as she was conscious throughout the event, no tongue biting, no postictal state, no loss of bowel or bladder. CT head negative for acute intercranial abnormality.  There is residual findings from previous hemorrhagic infarct.  No evidence for acute stroke at this time Glucose 215 No leukocytosis, fever, neck rigidity, altered mental status concerning for meningismus UA without UTI, respiratory panel without COVID or flu Unclear etiology of her tremoring/shaking this morning.  However her work-up here has been reassuring.  Throughout her almost 6 hours in the emergency department there has been no further episode of shaking.  This may be sequela from recent infarct and would recommend her follow-up with neurology.  She states she has an upcoming appointment.  I have recommended to call the office tomorrow morning to see if she can move up her scheduled appointment for early next year.  She verbalized understanding. Vital signs are stable.  She is  safe for discharge at this time.   Final Clinical Impression(s) / ED Diagnoses Final diagnoses:  Tremor    Rx / DC Orders ED Discharge Orders     None        Mickie Hillier, PA-C 06/10/21 Sikes, Alvin Critchley, DO 06/11/21 0050

## 2021-06-09 NOTE — ED Notes (Signed)
PT's CBG was 215. Informed [facility].

## 2021-06-09 NOTE — Discharge Instructions (Signed)
You were seen in the emergency department today after an episode of shaking this morning.  While you were here we did a image of your head as well as lab work which was all reassuring and normal.  Please return to the emergency department for any new symptoms, seizure-like activity, altered mental status or stroke symptoms.

## 2021-06-09 NOTE — ED Triage Notes (Signed)
Pt via pov from home with fatigue since yesterday. Pt states she had a stroke last month. She woke up shaking and was concerned for another stroke. Denies any dizziness, nausea, vomiting. Pt states she has had some uri symptoms in the last week. Pt alert & oriented, nad noted.

## 2021-06-15 ENCOUNTER — Encounter: Payer: Self-pay | Admitting: Rehabilitation

## 2021-06-15 ENCOUNTER — Encounter: Payer: Self-pay | Admitting: Occupational Therapy

## 2021-06-15 ENCOUNTER — Other Ambulatory Visit: Payer: Self-pay

## 2021-06-15 ENCOUNTER — Ambulatory Visit: Payer: 59 | Admitting: Occupational Therapy

## 2021-06-15 ENCOUNTER — Ambulatory Visit: Payer: 59 | Attending: Neurology | Admitting: Rehabilitation

## 2021-06-15 DIAGNOSIS — R41840 Attention and concentration deficit: Secondary | ICD-10-CM | POA: Insufficient documentation

## 2021-06-15 DIAGNOSIS — M6281 Muscle weakness (generalized): Secondary | ICD-10-CM

## 2021-06-15 DIAGNOSIS — R278 Other lack of coordination: Secondary | ICD-10-CM

## 2021-06-15 DIAGNOSIS — R2689 Other abnormalities of gait and mobility: Secondary | ICD-10-CM | POA: Diagnosis present

## 2021-06-15 DIAGNOSIS — R41844 Frontal lobe and executive function deficit: Secondary | ICD-10-CM

## 2021-06-15 DIAGNOSIS — R2681 Unsteadiness on feet: Secondary | ICD-10-CM | POA: Insufficient documentation

## 2021-06-15 NOTE — Therapy (Signed)
[facility] [address] [place], [place], [address] Phone: [phone]   Fax:  [phone]  Occupational Therapy Treatment  Patient Details  Name: Courtney Stewart MRN: [id number] Date of Birth: 08/09/1970 Referring Provider (OT): Leonie Man   Encounter Date: 06/15/2021   OT End of Session - 06/15/21 1101     Visit Number 3    Number of Visits 9    Date for OT Re-Evaluation 08/01/21   8 visits over 10 weeks d/t any scheduling conflicts   Authorization Type Self Pay - Medicaid Pending    OT Start Time 1100    OT Stop Time 1145    OT Time Calculation (min) 45 min    Activity Tolerance Patient tolerated treatment well;Patient limited by lethargy    Behavior During Therapy [facility] for tasks assessed/performed;Restless   pt extremely fatigued and lethargic            Past Medical History:  Diagnosis Date   Anemia    Diabetes mellitus without complication ([place])    Hypertension    NSTEMI (non-ST elevated myocardial infarction) ([place])    UC (ulcerative colitis) ([place])     Past Surgical History:  Procedure Laterality Date   CARDIAC CATHETERIZATION N/A 12/22/2015   Procedure: Left Heart Cath and Coronary Angiography;  Surgeon: Adrian Prows, MD;  Location: [facility];  Service: Cardiovascular;  Laterality: N/A;   CESAREAN SECTION      There were no vitals filed for this visit.   Subjective Assessment - 06/15/21 1100     Subjective  Pt reports "just sleepy" denies any changes and/or pain.    Pertinent History Anemia, DM, HTN, NSTEMI, ulcerative colitis    Limitations [facility], Fall Risk, mild left weakness    Patient Stated Goals "get back to driving"    Currently in Pain? No/denies    Pain Score 0-No pain            ADL/Sleep discussed in duration sleep patterns and establishing routine to stay awake longer during the day and sleep better at night. PT reports falling asleep very easily if just sitting  for any period of time. Education re: mentally stimulating activities for remaining awake and scheduled "in bed" times to allow for rest but still establish healthy sleep patterns.  Resistance Clothespins 1-8# with LUE with some in sitting and standing for higher level reaching. Pt with breaks x 2 for fatigue with reaching and shoulder endurance.   Grooved Pegs with LUE with min difficulty and breaks  x 2 d/t fatigue and internal distractions. Pt would drift off to sleep x 2 for brief periods and then awaken to continue task.   Constant Therapy Same Symbol level 8 with 88% accuracy - encourged patient to use left to right and top to bottom scanning method for attention. Read a Map level 1 - pt with increased difficulty when external distractions (I.e. baby crying) occurred in gym. 80% accuracy for reading a map activity.   Environmental Scanning with 14/15 accuracy = 93% on first pass.                         OT Long Term Goals - 06/15/21 1103       OT LONG TERM GOAL #1   Title Pt will be independent with HEP    Baseline would benefit from coordination and grip strength, proximal strength    Time 10    Period Weeks  Status On-going    Target Date 08/01/21      OT LONG TERM GOAL #2   Title Pt will perform physical and cognitive task simultaneously with 90% accuracy or greater    Time 10    Period Weeks    Status On-going      OT LONG TERM GOAL #3   Title Pt will increase grip strength by at least 5 lbs in LUE.    Baseline R 40.3, L 29.3    Time 10    Period Weeks    Status On-going      OT LONG TERM GOAL #4   Title Pt will complete 9 hole peg test with LUE in 5 seconds faster than at evaluation    Baseline R 23.54s, L 33.47s    Time 10    Period Weeks    Status On-going      OT LONG TERM GOAL #5   Title Pt will verbalize understanding of adapted strategies and/or equipment for increase safety and independence with ADLs and IADLs.    Time 10    Period  Weeks    Status On-going      OT LONG TERM GOAL #6   Title Pt will increase activity tolerance by completed 15 minutes or more of home management or cooking tasks per report or in clinic assessment without req'ing seated rest break.    Time 10    Period Weeks    Status New                   Plan - 06/15/21 1105     Clinical Impression Statement Pt reports no difficulty at home with exception of being sleepy. Pt with extreme lethargy, falling asleep during conversation and therapeutic activities.    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;Coordination;Endurance;Strength;ADL;Decreased knowledge of use of DME;Balance;Dexterity;GMC;Pain;UE functional use;IADL    Cognitive Skills Attention;Safety Awareness;Memory    Rehab Potential Good    Clinical Decision Making Limited treatment options, no task modification necessary    Comorbidities Affecting Occupational Performance: None    Modification or Assistance to Complete Evaluation  No modification of tasks or assist necessary to complete eval    OT Frequency 1x / week    OT Duration Other (comment)   8 visits over 10 weeks d/t any scheduling conflicts   OT Treatment/Interventions Patient/family education;Energy conservation;Functional Mobility Training;Passive range of motion;Therapeutic exercise;Cognitive remediation/compensation;Therapeutic activities;DME and/or AE instruction;Self-care/ADL training;Moist Heat;Neuromuscular education    Plan continue with environmental scanning, attention/cognition for functional tasks and strategies/AE for incr safety with ADLs/IADLs    Consulted and Agree with Plan of Care Patient             Patient will benefit from skilled therapeutic intervention in  order to improve the following deficits and impairments:   Body Structure / Function / Physical Skills: [facility], Coordination, Endurance, Strength, ADL, Decreased knowledge of use of DME, Balance, Dexterity, GMC, Pain, UE functional use, IADL Cognitive Skills: Attention, Safety Awareness, Memory     Visit Diagnosis: Muscle weakness (generalized)  Unsteadiness on feet  Other abnormalities of gait and mobility  Other lack of coordination  Attention and concentration deficit  Frontal lobe and executive function deficit    Problem List Patient Active Problem List   Diagnosis Date Noted  ICH (intracerebral hemorrhage) ([place]) 04/15/2021   Personal history of noncompliance with medical treatment, presenting hazards to health 11/16/2016   NSTEMI (non-ST elevated myocardial infarction) ([place]) 12/21/2015   Morbid obesity ([place]) 08/12/2015   Diabetes mellitus without complication ([place]) 0000000   Essential hypertension 08/12/2015    Zachery Conch, OT 06/15/2021, 11:42 AM  [facility] [address] [place], [place], [address] Phone: [phone]   Fax:  [phone]  Name: Ashtin Grisez MRN: [id number] Date of Birth: [birth year]

## 2021-06-15 NOTE — Patient Instructions (Signed)
Access Code: [email or web address] URL: [email or web address] Date: 06/15/2021 Prepared by: Cameron Sprang  Exercises Sit to Stand Without Arm Support - 1 x daily - 7 x weekly - 2 sets - 10 reps Forward Monster Walks - 1 x daily - 7 x weekly - 1 sets - 10 reps Flutter Kick at [employer] - 1 x daily - 7 x weekly - 2 sets - 10 reps Walking March - 1 x daily - 7 x weekly - 3 sets - 10 reps Step Up - 1 x daily - 7 x weekly - 3 sets - 10 reps Alternating Step Taps with Counter Support - 1 x daily - 7 x weekly - 3 sets - 10 reps

## 2021-06-15 NOTE — Therapy (Signed)
[facility] [address] [place], [place], [address] Phone: [phone]   Fax:  [phone]  Physical Therapy Treatment  Patient Details  Name: Courtney Stewart MRN: [id number] Date of Birth: [birth year] Referring Provider (PT): Antony Contras, MD   Encounter Date: 06/15/2021   PT End of Session - 06/15/21 1058     Visit Number 2    Number of Visits 9    Authorization Type MCD pending    PT Start Time 1005    PT Stop Time 1050    PT Time Calculation (min) 45 min    Activity Tolerance Patient tolerated treatment well    Behavior During Therapy [facility] for tasks assessed/performed;Restless             Past Medical History:  Diagnosis Date   Anemia    Diabetes mellitus without complication ([place])    Hypertension    NSTEMI (non-ST elevated myocardial infarction) ([place])    UC (ulcerative colitis) ([place])     Past Surgical History:  Procedure Laterality Date   CARDIAC CATHETERIZATION N/A 12/22/2015   Procedure: Left Heart Cath and Coronary Angiography;  Surgeon: Adrian Prows, MD;  Location: [facility];  Service: Cardiovascular;  Laterality: N/A;   CESAREAN SECTION      There were no vitals filed for this visit.   Subjective Assessment - 06/15/21 1007     Subjective Went to ED last week for tremors, but was okay following check up.  Not sure what happened.    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 No/denies                               [facility] Adult PT Treatment/Exercise - 06/15/21 1054       Transfers   Transfers Sit to Stand;Stand to Sit    Sit to Stand 6: Modified independent (Device/Increase time)    Stand to Sit 6: Modified independent (Device/Increase time)    Comments Performed x 10 reps without UE support from 14" block to increase challenge.  Added to HEP.      Ambulation/Gait    Ambulation/Gait Yes    Ambulation/Gait Assistance 6: Modified independent (Device/Increase time)    Ambulation/Gait Assistance Details During session from task to task    Ambulation Distance (Feet) 200 Feet    Assistive device None    Gait Pattern Step-through pattern;Decreased stride length;Trunk flexed;Wide base of support    Ambulation Surface Level;Indoor      High Level Balance   High Level Balance Activities Marching forwards;Marching backwards    High Level Balance Comments At counter top x 4 laps, added to HEP.  Performed alternating cone taps x 20 reps, added to HEP (single UE support)      Exercises   Exercises Other Exercises    Other Exercises  Forward step ups to 6" step using BUE support leading with RLE x 10 reps, then LLE x 10 reps.  Cues to descend with RLE at times to allow LLE to bend and lower her. Monster walks with red theraband x 3 laps at counter top, Added to HEP.               Access Code: [email or web address] URL: [email or web address] Date: 06/15/2021 Prepared by: Cameron Sprang   Exercises Sit to  Stand Without Arm Support - 1 x daily - 7 x weekly - 2 sets - 10 reps Forward Monster Walks - 1 x daily - 7 x weekly - 1 sets - 10 reps Flutter Kick at [employer] - 1 x daily - 7 x weekly - 2 sets - 10 reps Walking March - 1 x daily - 7 x weekly - 3 sets - 10 reps Step Up - 1 x daily - 7 x weekly - 3 sets - 10 reps Alternating Step Taps with Counter Support - 1 x daily - 7 x weekly - 3 sets - 10 reps      PT Education - 06/15/21 1057     Education Details Education on possibly doing a single pool session to set up pool HEP as she wants to start going to the [employer], HEP    Person(s) Educated Patient    Methods Explanation;Demonstration;Handout    Comprehension Verbalized understanding;Returned demonstration              PT Short Term Goals - 05/18/21 0948       PT SHORT TERM GOAL #1   Title Pt will initiate HEP in order to indicate improved  functional mobility and dec fall risk.  (Target Date:  Following 3rd visit)    Baseline no HEP at this time    Time 1    Period Months    Status New      PT SHORT TERM GOAL #2   Title Pt will improve gait speed to >/=2.28 ft/sec in order to indicate improving community gait.    Baseline 1.98 ft/sec    Time 1    Period Months    Status New      PT SHORT TERM GOAL #3   Title Pt will negotiate up/down stairs with single rail in alternating fashion in order to indicate improved strength.    Baseline 4 with B rails in step to pattern    Time 1    Period Months    Status New               PT Long Term Goals - 05/18/21 0950       PT LONG TERM GOAL #1   Title Pt will  be IND with final HEP in order to indicate improved functional mobility and dec fall risk.  (Following 6th visit)    Baseline no HEP at this time    Time 2    Period Months    Status New      PT LONG TERM GOAL #2   Title Pt will improve FGA to >/=22/30 in order to indicate dec fall risk.    Baseline 17/30    Time 2    Period Months    Status New      PT LONG TERM GOAL #3   Title Pt will improve gait speed to >/=2.62 ft/sec in order to indicate safe community mobility.    Baseline 1.98 ft/sec    Time 2    Period Months    Status New      PT LONG TERM GOAL #4   Title Pt will ambulate x 500' over varying outdoor surfaces at mod I level in order to indicate safe community mobility.    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    Baseline has not returned to work    Time 2    Period Months    Status New                   Plan - 06/15/21 1058     Clinical Impression Statement Skilled session focused on initiating HEP for BLE strengthening and high level balance, focusing on SLS.  Pt tolerated very well  however reporting moderate fatigue.  She reports sleeping a lot at home, therefore encouraged her to do exercises 2-3 times per day, and getting up at least once per hour at home. Pt verbalized and demo understanding    Personal Factors and Comorbidities Comorbidity 3+;Transportation;Finances    Comorbidities see above    Examination-Activity Limitations Locomotion Level;Stand;Stairs;Squat;Transfers    Examination-Participation Restrictions Community Activity;Driving;Personal Finances;Occupation    Stability/Clinical Decision Making Evolving/Moderate complexity    Rehab Potential Good    PT Frequency 1x / week   1x/wk for 8 weeks as MCD is pending   PT Duration 8 weeks    PT Treatment/Interventions ADLs/Self Care Home Management;DME Instruction;Gait training;Stair training;Functional mobility training;Therapeutic activities;Therapeutic exercise;Balance training;Neuromuscular re-education;Patient/family education;Vestibular;Passive range of motion    PT Next Visit Plan How is HEP going?Does she want to do pool session? balance strategies, work on increasing gait speed (treadmill??), functional strengthening    Consulted and Agree with Plan of Care Patient             Patient will benefit from skilled therapeutic intervention in order to improve the following deficits and impairments:  Decreased activity tolerance, Decreased balance, Decreased mobility, Impaired flexibility, Postural dysfunction, Impaired sensation  Visit Diagnosis: Muscle weakness (generalized)  Unsteadiness on feet  Other abnormalities of gait and mobility     Problem List Patient Active Problem List   Diagnosis Date Noted   ICH (intracerebral hemorrhage) ([place]) 04/15/2021   Personal history of noncompliance with medical treatment, presenting hazards to health 11/16/2016   NSTEMI (non-ST elevated myocardial infarction) ([place]) 12/21/2015   Morbid obesity ([place]) 08/12/2015   Diabetes mellitus without complication  ([place]) 0000000   Essential hypertension 08/12/2015    Cameron Sprang, PT, MPT [facility] [address] [place], [place], [address] Phone: [phone]   Fax:  [phone] 06/15/21, 11:00 AM   Name: Courtney Stewart MRN: [id number] Date of Birth: 1971/01/17

## 2021-06-22 ENCOUNTER — Ambulatory Visit: Payer: 59 | Admitting: Rehabilitation

## 2021-06-22 ENCOUNTER — Ambulatory Visit: Payer: 59 | Admitting: Occupational Therapy

## 2021-06-28 ENCOUNTER — Encounter: Payer: Self-pay | Admitting: Occupational Therapy

## 2021-06-28 ENCOUNTER — Ambulatory Visit: Payer: 59 | Admitting: Occupational Therapy

## 2021-06-28 ENCOUNTER — Encounter: Payer: Self-pay | Admitting: Rehabilitation

## 2021-06-28 ENCOUNTER — Ambulatory Visit: Payer: 59 | Admitting: Rehabilitation

## 2021-06-28 ENCOUNTER — Other Ambulatory Visit: Payer: Self-pay

## 2021-06-28 DIAGNOSIS — R2689 Other abnormalities of gait and mobility: Secondary | ICD-10-CM

## 2021-06-28 DIAGNOSIS — M6281 Muscle weakness (generalized): Secondary | ICD-10-CM

## 2021-06-28 DIAGNOSIS — R278 Other lack of coordination: Secondary | ICD-10-CM

## 2021-06-28 DIAGNOSIS — R2681 Unsteadiness on feet: Secondary | ICD-10-CM

## 2021-06-28 DIAGNOSIS — R41840 Attention and concentration deficit: Secondary | ICD-10-CM

## 2021-06-28 DIAGNOSIS — R41844 Frontal lobe and executive function deficit: Secondary | ICD-10-CM

## 2021-06-28 NOTE — Therapy (Signed)
[facility] [address] [place], [place], [address] Phone: [phone]   Fax:  [phone]  Occupational Therapy Treatment  Patient Details  Name: Courtney Stewart MRN: [id number] Date of Birth: [birth year] Referring Provider (OT): Pearlean Brownie   Encounter Date: 06/28/2021   OT End of Session - 06/28/21 1234     Visit Number 4    Number of Visits 9    Date for OT Re-Evaluation 08/01/21   8 visits over 10 weeks d/t any scheduling conflicts   Authorization Type Self Pay - Medicaid Pending    OT Start Time 1232    OT Stop Time 1313    OT Time Calculation (min) 41 min    Activity Tolerance Patient tolerated treatment well;Patient limited by lethargy    Behavior During Therapy [facility] for tasks assessed/performed;Restless   pt extremely fatigued and lethargic            Past Medical History:  Diagnosis Date   Anemia    Diabetes mellitus without complication ([facility])    Hypertension    NSTEMI (non-ST elevated myocardial infarction) ([facility])    UC (ulcerative colitis) ([facility])     Past Surgical History:  Procedure Laterality Date   CARDIAC CATHETERIZATION N/A 12/22/2015   Procedure: Left Heart Cath and Coronary Angiography;  Surgeon: Yates Decamp, MD;  Location: [facility];  Service: Cardiovascular;  Laterality: N/A;   CESAREAN SECTION      There were no vitals filed for this visit.   Subjective Assessment - 06/28/21 1235     Subjective  nothing new, denies pain    Pertinent History Anemia, DM, HTN, NSTEMI, ulcerative colitis    Limitations [facility] Transportation, Fall Risk, mild left weakness    Patient Stated Goals "get back to driving"    Currently in Pain? No/denies               Completing 24-piece puzzle with incr time, but no cueing needed.    Copying small peg design with L hand for incr coordination and attention/problem solving with incr time, but no cueing needed, good coordination.             OT Education - 06/28/21 1240     Education Details Cognitive Compensation Strategies--see pt instructions.  Yellow theraband HEP for proximal strength--see pt instructions.    Person(s) Educated Patient    Methods Explanation;Handout;Demonstration;Verbal cues;Tactile cues    Comprehension Verbalized understanding;Returned demonstration;Verbal cues required                 OT Long Term Goals - 06/15/21 1103       OT LONG TERM GOAL #1   Title Pt will be independent with HEP    Baseline would benefit from coordination and grip strength, proximal strength    Time 10    Period Weeks    Status On-going    Target Date 08/01/21      OT LONG TERM GOAL #2   Title Pt will perform physical and cognitive task simultaneously with 90% accuracy or greater    Time 10    Period Weeks    Status On-going      OT LONG TERM GOAL #3   Title Pt will increase grip strength by at least 5 lbs in LUE.    Baseline R 40.3, L 29.3    Time 10    Period Weeks    Status On-going      OT LONG TERM GOAL #4  Title Pt will complete 9 hole peg test with LUE in 5 seconds faster than at evaluation    Baseline R 23.54s, L 33.47s    Time 10    Period Weeks    Status On-going      OT LONG TERM GOAL #5   Title Pt will verbalize understanding of adapted strategies and/or equipment for increase safety and independence with ADLs and IADLs.    Time 10    Period Weeks    Status On-going      OT LONG TERM GOAL #6   Title Pt will increase activity tolerance by completed 15 minutes or more of home management or cooking tasks per report or in clinic assessment without req'ing seated rest break.    Time 10    Period Weeks    Status New                   Plan - 06/28/21 1234     Clinical Impression Statement Pt reports that she is doing everything but has trouble with attention, timing, and procrastinating.  Pt less distracted today during session.    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;Coordination;Endurance;Strength;ADL;Decreased knowledge of use of DME;Balance;Dexterity;GMC;Pain;UE functional use;IADL    Cognitive Skills Attention;Safety Awareness;Memory    Rehab Potential Good    Clinical Decision Making Limited treatment options, no task modification necessary    Comorbidities Affecting Occupational Performance: None    Modification or Assistance to Complete Evaluation  No modification of tasks or assist necessary to complete eval    OT Frequency 1x / week    OT Duration Other (comment)   8 visits over 10 weeks d/t any scheduling conflicts   OT Treatment/Interventions Patient/family education;Energy conservation;Functional Mobility Training;Passive range of motion;Therapeutic exercise;Cognitive remediation/compensation;Therapeutic activities;DME and/or AE instruction;Self-care/ADL training;Moist Heat;Neuromuscular education    Plan continue with environmental scanning, attention/cognition for functional tasks and strategies/AE for incr safety with ADLs/IADLs    Consulted and Agree with Plan of Care Patient             Patient will benefit from skilled therapeutic intervention in order to improve the following deficits and impairments:   Body Structure / Function / Physical Skills: [facility], Coordination, Endurance, Strength, ADL, Decreased knowledge of use of DME, Balance, Dexterity, GMC, Pain, UE functional use, IADL Cognitive Skills: Attention, Safety Awareness, Memory     Visit Diagnosis: Other lack of coordination  Attention and concentration deficit  Frontal lobe and executive function deficit  Muscle weakness (generalized)    Problem List Patient Active Problem List   Diagnosis Date Noted   ICH (intracerebral hemorrhage)  ([place]) 04/15/2021   Personal history of noncompliance with medical treatment, presenting hazards to health 11/16/2016   NSTEMI (non-ST elevated myocardial infarction) ([place]) 12/21/2015   Morbid obesity ([place]) 08/12/2015   Diabetes mellitus without complication ([place]) 0000000   Essential hypertension 08/12/2015    Vianne Bulls, OT 06/28/2021, 1:12 PM  [facility] [address] [place], [place], [address] Phone: [phone]   Fax:  [phone]  Name: Courtney Stewart MRN: [id number] Date of Birth: September 21, 1970   Vianne Bulls, OTR/L [facility] [address] [place], [place]  [address] [phone] phone [phone] 06/28/21 1:12 PM

## 2021-06-28 NOTE — Therapy (Signed)
[facility] [address] [place], [place], [address] Phone: [phone]   Fax:  [phone]  Physical Therapy Treatment  Patient Details  Name: Courtney Stewart MRN: [id number] Date of Birth: [birth year] Referring Provider (PT): Antony Contras, MD   Encounter Date: 06/28/2021   PT End of Session - 06/28/21 1152     Visit Number 3    Number of Visits 9    Authorization Type MCD pending    Authorization - Visit Number 2    PT Start Time [id number]    PT Stop Time 1230    PT Time Calculation (min) 42 min    Activity Tolerance Patient tolerated treatment well    Behavior During Therapy [facility] for tasks assessed/performed;Restless             Past Medical History:  Diagnosis Date   Anemia    Diabetes mellitus without complication ([place])    Hypertension    NSTEMI (non-ST elevated myocardial infarction) ([place])    UC (ulcerative colitis) ([place])     Past Surgical History:  Procedure Laterality Date   CARDIAC CATHETERIZATION N/A 12/22/2015   Procedure: Left Heart Cath and Coronary Angiography;  Surgeon: Adrian Prows, MD;  Location: [facility];  Service: Cardiovascular;  Laterality: N/A;   CESAREAN SECTION      There were no vitals filed for this visit.   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 No/denies                               [facility] Adult PT Treatment/Exercise - 06/28/21 1154       Transfers   Transfers Sit to Stand;Stand to Sit    Sit to Stand 6: Modified independent (Device/Increase time)    Stand to Sit 6: Modified independent (Device/Increase time)      Ambulation/Gait   Ambulation/Gait Yes    Ambulation/Gait Assistance 6: Modified independent  (Device/Increase time)    Ambulation/Gait Assistance Details During session from task to task    Ambulation Distance (Feet) 150 Feet   x 2 reps   Assistive device None    Gait Pattern Step-through pattern;Decreased stride length;Trunk flexed;Wide base of support    Ambulation Surface Level;Indoor    Stairs Yes    Stairs Assistance 5: Supervision    Stairs Assistance Details (indicate cue type and reason) Performed stairs x 6 reps during session for LE strengthening.  Pt initially with max difficulty sequencing that PT wanted her to ascend in reciprocal pattern, therefore PT demo'd and then provided tactile cuing for correct sequencing.  Allowed her to descend in step to pattern for safety and recommend she do stairs at home for exercise.  Pt verbalized understanding.    Stair Management Technique One rail Left;Alternating pattern;Step to pattern;Forwards    Number of Stairs 4   x 6 reps   Height of Stairs 6      Neuro Re-ed    Neuro Re-ed Details  High level balance in //bars: Standing on foam airex beginning with feet apart EO x 20 secs>EO with head motion up/down and side/side x 10 reps each>EC x 2 sets  of 20 secs.  Moved feet together EO x 20 secs>EO with head motions up/down and side/side x 10 reps each, EC x 2 sets of 20 secs.  Transitioned to laterall stepping off foam and back x 10 reps on each side.      Exercises   Exercises Knee/Hip    Other Exercises  Forward step ups to 6" step with opposite LE march x 10 reps on each side.  Pt very nervous on LLE and needed BUE support feeling that L knee would buckle.  It did not buckle during exercise.  Pt moderately fatigued following exercise.      Knee/Hip Exercises: Aerobic   Stepper Scifit stepper x 5 mins with LEs only at level 3 resistance, keeping rpms in 100's throughout.  Tolerated well but reports fatigue and some soreness.                     PT Education - 06/28/21 1243     Education Details doing stairs daily for LE  strengthening    Person(s) Educated Patient    Methods Explanation    Comprehension Verbalized understanding              PT Short Term Goals - 05/18/21 0948       PT SHORT TERM GOAL #1   Title Pt will initiate HEP in order to indicate improved functional mobility and dec fall risk.  (Target Date:  Following 3rd visit)    Baseline no HEP at this time    Time 1    Period Months    Status New      PT SHORT TERM GOAL #2   Title Pt will improve gait speed to >/=2.28 ft/sec in order to indicate improving community gait.    Baseline 1.98 ft/sec    Time 1    Period Months    Status New      PT SHORT TERM GOAL #3   Title Pt will negotiate up/down stairs with single rail in alternating fashion in order to indicate improved strength.    Baseline 4 with B rails in step to pattern    Time 1    Period Months    Status New               PT Long Term Goals - 05/18/21 0950       PT LONG TERM GOAL #1   Title Pt will  be IND with final HEP in order to indicate improved functional mobility and dec fall risk.  (Following 6th visit)    Baseline no HEP at this time    Time 2    Period Months    Status New      PT LONG TERM GOAL #2   Title Pt will improve FGA to >/=22/30 in order to indicate dec fall risk.    Baseline 17/30    Time 2    Period Months    Status New      PT LONG TERM GOAL #3   Title Pt will improve gait speed to >/=2.62 ft/sec in order to indicate safe community mobility.    Baseline 1.98 ft/sec    Time 2    Period Months    Status New      PT LONG TERM GOAL #4   Title Pt will ambulate x 500' over varying outdoor surfaces at mod I level in order to indicate safe community mobility.    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    Baseline has not returned to work     Time 2    Period Months    Status New                   Plan - 06/28/21 1240     Clinical Impression Statement Skilled session focused on balance with emphasis on vestibular system challenges with compliant surfaces and SLS along with LE strengthening with step ups and stairs.  Pt needing several seated rest breaks and note moderate difficulty when cuing her for sequencing with stair climbing.    Personal Factors and Comorbidities Comorbidity 3+;Transportation;Finances    Comorbidities see above    Examination-Activity Limitations Locomotion Level;Stand;Stairs;Squat;Transfers    Examination-Participation Restrictions Community Activity;Driving;Personal Finances;Occupation    Stability/Clinical Decision Making Evolving/Moderate complexity    Rehab Potential Good    PT Frequency 1x / week   1x/wk for 8 weeks as MCD is pending   PT Duration 8 weeks    PT Treatment/Interventions ADLs/Self Care Home Management;DME Instruction;Gait training;Stair training;Functional mobility training;Therapeutic activities;Therapeutic exercise;Balance training;Neuromuscular re-education;Patient/family education;Vestibular;Passive range of motion    PT Next Visit Plan L SLS, L NMR, balance strategies, work on increasing gait speed (treadmill??), functional strengthening    Consulted and Agree with Plan of Care Patient             Patient will benefit from skilled therapeutic intervention in order to improve the following deficits and impairments:  Decreased activity tolerance, Decreased balance, Decreased mobility, Impaired flexibility, Postural dysfunction, Impaired sensation  Visit Diagnosis: Muscle weakness (generalized)  Unsteadiness on feet  Other abnormalities of gait and mobility     Problem List Patient Active Problem List   Diagnosis Date Noted   ICH (intracerebral hemorrhage) ([place]) 04/15/2021   Personal history of noncompliance with medical treatment, presenting hazards to  health 11/16/2016   NSTEMI (non-ST elevated myocardial infarction) ([place]) 12/21/2015   Morbid obesity ([place]) 08/12/2015   Diabetes mellitus without complication ([place]) 0000000   Essential hypertension 08/12/2015    Cameron Sprang, PT, MPT [facility] [address] [place], [place], [address] Phone: [phone]   Fax:  [phone] 06/28/21, 12:44 PM   Name: Courtney Stewart MRN: [id number] Date of Birth: 04-Oct-1970

## 2021-06-28 NOTE — Patient Instructions (Addendum)
° °  COGNITIVE TIPS   Keep notebook with sections for the following (or use sections separately as needed):  calendar and appointment sheet, schedule for each day, lists of reminders (such as grocery lists or to do list), homework assignments for therapy, important information such as family and friends names/ addresses/ phone numbers, medications, medical history, and doctors name/ phone numbers, meal plan, journal.     2.   Avoid/remove clutter and unnecessary items from areas such as countertops/cabinets in kitchen and bathroom, closets, etc.  Organize items by purpose.  Baskets and bins help with this.   3.  Leave notes for reminders above task to be completed.  For example:  Note to turn off stove over the stove; note to lock door beside the door, note to brush teeth then wash face by sink, note to take medication on table, etc.  4.  Make daily and weekly schedules (when someone is there to help you if needed).  Determine the day before how much time is needed for each task and as a result when you should start.    5.  Make a list of tasks to complete when doing a project/running errands and check things off as you go  6.  Plan out meals for the week and write a grocery list  7.  Before you start an task or cook, get out all needed ingredients/items.  Think through or write down the steps you need to do before you start.  8.  Break down larger, more complex tasks into steps/to do list and then check off steps as you go.              Strengthening: Resisted Flexion   Attach tube to door.  Hold tubing with one arm at side. Pull forward and up with elbow straight. Move shoulder through pain-free range of motion, no further than shoulder height. Repeat 10 times per set.  Do 1 sessions per day.    Strengthening: Resisted Extension   Attach one end to door.  Hold tubing in one hand, arm forward. Pull arm back, elbow straight. Repeat 10 times per set. Do 1 sessions per  day.   Resisted Horizontal Abduction: Bilateral   Sit or stand, tubing in both hands, palms down and arms out in front. Keeping arms straight, pinch shoulder blades together and stretch arms out. Repeat 10 times per set.  Do 1 sessions per day.     **REPEAT ALL WITH BOTH ARMS.

## 2021-07-06 ENCOUNTER — Ambulatory Visit: Payer: 59 | Admitting: Occupational Therapy

## 2021-07-06 ENCOUNTER — Ambulatory Visit: Payer: 59 | Admitting: Rehabilitation

## 2021-07-12 ENCOUNTER — Ambulatory Visit: Payer: 59 | Admitting: Occupational Therapy

## 2021-07-12 ENCOUNTER — Encounter: Payer: Self-pay | Admitting: Occupational Therapy

## 2021-07-12 ENCOUNTER — Other Ambulatory Visit: Payer: Self-pay

## 2021-07-12 ENCOUNTER — Ambulatory Visit: Payer: 59

## 2021-07-12 DIAGNOSIS — R2681 Unsteadiness on feet: Secondary | ICD-10-CM

## 2021-07-12 DIAGNOSIS — M6281 Muscle weakness (generalized): Secondary | ICD-10-CM | POA: Diagnosis not present

## 2021-07-12 DIAGNOSIS — R2689 Other abnormalities of gait and mobility: Secondary | ICD-10-CM

## 2021-07-12 DIAGNOSIS — R41844 Frontal lobe and executive function deficit: Secondary | ICD-10-CM

## 2021-07-12 DIAGNOSIS — R41840 Attention and concentration deficit: Secondary | ICD-10-CM

## 2021-07-12 DIAGNOSIS — R278 Other lack of coordination: Secondary | ICD-10-CM

## 2021-07-12 NOTE — Therapy (Signed)
[facility] [address] [place], [place], [address] Phone: [phone]   Fax:  [phone]  Occupational Therapy Treatment  Patient Details  Name: Courtney Stewart MRN: [id number] Date of Birth: 12-29-1970 Referring Provider (OT): Leonie Man   Encounter Date: 07/12/2021   OT End of Session - 07/12/21 0936     Visit Number 5    Number of Visits 9    Date for OT Re-Evaluation 08/01/21   8 visits over 10 weeks d/t any scheduling conflicts   Authorization Type Self Pay - Medicaid Pending    OT Start Time 0933    OT Stop Time 1015    OT Time Calculation (min) 42 min    Activity Tolerance Patient tolerated treatment well;Patient limited by lethargy    Behavior During Therapy [facility] for tasks assessed/performed;Restless   pt extremely fatigued and lethargic            Past Medical History:  Diagnosis Date   Anemia    Diabetes mellitus without complication ([place])    Hypertension    NSTEMI (non-ST elevated myocardial infarction) ([place])    UC (ulcerative colitis) ([place])     Past Surgical History:  Procedure Laterality Date   CARDIAC CATHETERIZATION N/A 12/22/2015   Procedure: Left Heart Cath and Coronary Angiography;  Surgeon: Adrian Prows, MD;  Location: [facility];  Service: Cardiovascular;  Laterality: N/A;   CESAREAN SECTION      There were no vitals filed for this visit.   Subjective Assessment - 07/12/21 0936     Subjective  "everything is going real slow"    Pertinent History Anemia, DM, HTN, NSTEMI, ulcerative colitis    Limitations [facility] Transportation, Fall Risk, mild left weakness    Patient Stated Goals "get back to driving"    Currently in Pain? No/denies    Pain Score 0-No pain             Grooved Pegs with LUE with good coordination and good time.   Constant Therapy Alternating Symbols level 6 with 87% accuracy and 59.82s response time.  Hand Gripper: with LUE on level 3 with black spring x  10 blocks and downgraded to level 2. Pt picked up 1 inch blocks with gripper with mod/max drops and mod/max difficulty.  Perfection with using LUE for coordination and pincer grasp with no drops and min difficulty and increased time for finding location for shapes.                       OT Long Term Goals - 06/15/21 1103       OT LONG TERM GOAL #1   Title Pt will be independent with HEP    Baseline would benefit from coordination and grip strength, proximal strength    Time 10    Period Weeks    Status On-going    Target Date 08/01/21      OT LONG TERM GOAL #2   Title Pt will perform physical and cognitive task simultaneously with 90% accuracy or greater    Time 10    Period Weeks    Status On-going      OT LONG TERM GOAL #3   Title Pt will increase grip strength by at least 5 lbs in LUE.    Baseline R 40.3, L 29.3    Time 10    Period Weeks    Status On-going      OT LONG TERM GOAL #4  Title Pt will complete 9 hole peg test with LUE in 5 seconds faster than at evaluation    Baseline R 23.54s, L 33.47s    Time 10    Period Weeks    Status On-going      OT LONG TERM GOAL #5   Title Pt will verbalize understanding of adapted strategies and/or equipment for increase safety and independence with ADLs and IADLs.    Time 10    Period Weeks    Status On-going      OT LONG TERM GOAL #6   Title Pt will increase activity tolerance by completed 15 minutes or more of home management or cooking tasks per report or in clinic assessment without req'ing seated rest break.    Time 10    Period Weeks    Status New                   Plan - 07/12/21 1034     Clinical Impression Statement Pt more alert and mentally focused today. Pt continues to progress towards goals.    OT Occupational Profile and History Problem Focused Assessment - Including review of records relating to presenting problem    Occupational performance deficits (Please refer to  evaluation for details): ADL's;IADL's;Leisure;Work    Marketing executive / Function / Physical Skills FMC;Coordination;Endurance;Strength;ADL;Decreased knowledge of use of DME;Balance;Dexterity;GMC;Pain;UE functional use;IADL    Cognitive Skills Attention;Safety Awareness;Memory    Rehab Potential Good    Clinical Decision Making Limited treatment options, no task modification necessary    Comorbidities Affecting Occupational Performance: None    Modification or Assistance to Complete Evaluation  No modification of tasks or assist necessary to complete eval    OT Frequency 1x / week    OT Duration Other (comment)   8 visits over 10 weeks d/t any scheduling conflicts   OT Treatment/Interventions Patient/family education;Energy conservation;Functional Mobility Training;Passive range of motion;Therapeutic exercise;Cognitive remediation/compensation;Therapeutic activities;DME and/or AE instruction;Self-care/ADL training;Moist Heat;Neuromuscular education    Plan continue with environmental scanning, attention/cognition for functional tasks and strategies/AE for incr safety with ADLs/IADLs    Consulted and Agree with Plan of Care Patient             Patient will benefit from skilled therapeutic intervention in order to improve the following deficits and impairments:   Body Structure / Function / Physical Skills: [facility], Coordination, Endurance, Strength, ADL, Decreased knowledge of use of DME, Balance, Dexterity, GMC, Pain, UE functional use, IADL Cognitive Skills: Attention, Safety Awareness, Memory     Visit Diagnosis: Other lack of coordination  Frontal lobe and executive function deficit  Attention and concentration deficit  Muscle weakness (generalized)  Unsteadiness on feet  Other abnormalities of gait and mobility    Problem List Patient Active Problem List   Diagnosis Date Noted   ICH (intracerebral hemorrhage) ([place]) 04/15/2021   Personal history of noncompliance with medical  treatment, presenting hazards to health 11/16/2016   NSTEMI (non-ST elevated myocardial infarction) ([place]) 12/21/2015   Morbid obesity ([place]) 08/12/2015   Diabetes mellitus without complication ([place]) 0000000   Essential hypertension 08/12/2015    Zachery Conch, OT 07/12/2021, 10:35 AM  [facility] [address] [place], [place], [address] Phone: [phone]   Fax:  [phone]  Name: Courtney Stewart MRN: [id number] Date of Birth: 25-Aug-1970

## 2021-07-12 NOTE — Therapy (Signed)
[facility] [address] [place], [place], [address] Phone: [phone]   Fax:  [phone]  Physical Therapy Treatment  Patient Details  Name: Courtney Stewart MRN: [id number] Date of Birth: December 11, 1970 Referring Provider (PT): Antony Contras, MD   Encounter Date: 07/12/2021   PT End of Session - 07/12/21 1030     Visit Number 4    Number of Visits 9    Authorization Type MCD pending    Authorization - Visit Number 3    PT Start Time 1020    PT Stop Time 1100    PT Time Calculation (min) 40 min    Equipment Utilized During Treatment Gait belt    Activity Tolerance Patient tolerated treatment well    Behavior During Therapy [facility] for tasks assessed/performed;Restless             Past Medical History:  Diagnosis Date   Anemia    Diabetes mellitus without complication ([place])    Hypertension    NSTEMI (non-ST elevated myocardial infarction) ([place])    UC (ulcerative colitis) ([place])     Past Surgical History:  Procedure Laterality Date   CARDIAC CATHETERIZATION N/A 12/22/2015   Procedure: Left Heart Cath and Coronary Angiography;  Surgeon: Adrian Prows, MD;  Location: [facility];  Service: Cardiovascular;  Laterality: N/A;   CESAREAN SECTION      There were no vitals filed for this visit.   Subjective Assessment - 07/12/21 1030     Subjective Reports doing exercises at home. Stairs are most difficult. Patient reprots stiffness in L knee and anxiety when going down steps. She lives on 1st floor in her apartment and has stairs to go to 2nd floor, rails on both side but can't reach them both.    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 No/denies                [facility] PT Assessment - 07/12/21 0001       Ambulation/Gait   Gait velocity 1.2 m/s or 3.93 ft/s            Sci fit: level 6 for 8' Stair training: - 3x with bil hand rails and step to pattern with leading with R LE when ascending and leading with L LE when descending - tried reciprocal pattern: pt able to go up with reciprocal pattern with bil UE support but unable to perform descending steps due to lack of confidence in L knee - Fwd step down and retro step ups with bil hand rails from 4" box: tactile cues behind knee to facilitate flexion: 10x L only Gait speed: 3.93 ft/s without AD Ramp: going up the ramp fwd and coming down bwd: 5x             Going down ramp fwd  and coming back up bwd: 5x Curb: down with L LE and up with R LE: 2x without UE support.                            PT Short Term Goals - 07/12/21 1053       PT SHORT TERM GOAL #1   Title Pt will initiate HEP in order to indicate improved functional mobility and dec fall risk.  (  Target Date:  Following 3rd visit)    Baseline no HEP at this time    Time 1    Period Months    Status New      PT SHORT TERM GOAL #2   Title Pt will improve gait speed to >/=2.28 ft/sec in order to indicate improving community gait.    Baseline 1.98 ft/sec; 3.93 ft/s (07/12/21)    Time 1    Period Months    Status Achieved      PT SHORT TERM GOAL #3   Title Pt will negotiate up/down stairs with single rail in alternating fashion in order to indicate improved strength.    Baseline 4 with B rails in step to pattern; able to ascend with reciprocating    Time 1    Period Months    Status New               PT Long Term Goals - 05/18/21 0950       PT LONG TERM GOAL #1   Title Pt will  be IND with final HEP in order to indicate improved functional mobility and dec fall risk.  (Following 6th visit)    Baseline no HEP at this time    Time 2    Period Months    Status New      PT LONG TERM GOAL #2   Title Pt will improve FGA to >/=22/30 in order to indicate dec fall risk.    Baseline 17/30    Time 2     Period Months    Status New      PT LONG TERM GOAL #3   Title Pt will improve gait speed to >/=2.62 ft/sec in order to indicate safe community mobility.    Baseline 1.98 ft/sec    Time 2    Period Months    Status New      PT LONG TERM GOAL #4   Title Pt will ambulate x 500' over varying outdoor surfaces at mod I level in order to indicate safe community mobility.    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    Baseline has not returned to work    Time 2    Period Months    Status New                   Plan - 07/12/21 1101     Clinical Impression Statement Pt met short term goal 2 today and progressing towards short term goal 3. Pt is hesitant bending L knee during descending steps due to potentially OA and lack of confidence. Crepitus was audible in L knee with deschending stpes on 4" step and pt started to complain of little bit of pain. Pt was educated that goal will be work through little bit of pain to strengthen muscles around L knee and to strength L LE in general. Pt verbalized understanding. pt encouraged to continue to use step to pattern to practice steps at home every other day to improve general confidence with stair activity    Personal Factors and Comorbidities Comorbidity 3+;Transportation;Finances    Comorbidities see above    Examination-Activity Limitations Locomotion Level;Stand;Stairs;Squat;Transfers    Examination-Participation Restrictions  Community Activity;Driving;Personal Finances;Occupation    Stability/Clinical Decision Making Evolving/Moderate complexity    Rehab Potential Good    PT Frequency 1x / week   1x/wk for 8 weeks as MCD is pending   PT Duration 8 weeks    PT Treatment/Interventions ADLs/Self Care Home Management;DME Instruction;Gait  training;Stair training;Functional mobility training;Therapeutic activities;Therapeutic exercise;Balance training;Neuromuscular re-education;Patient/family education;Vestibular;Passive range of motion    PT Next Visit Plan L SLS, L NMR, balance strategies, work on increasing gait speed (treadmill??), functional strengthening    Consulted and Agree with Plan of Care Patient             Patient will benefit from skilled therapeutic intervention in order to improve the following deficits and impairments:  Decreased activity tolerance, Decreased balance, Decreased mobility, Impaired flexibility, Postural dysfunction, Impaired sensation  Visit Diagnosis: Muscle weakness (generalized)  Unsteadiness on feet  Other abnormalities of gait and mobility     Problem List Patient Active Problem List   Diagnosis Date Noted   ICH (intracerebral hemorrhage) ([place]) 04/15/2021   Personal history of noncompliance with medical treatment, presenting hazards to health 11/16/2016   NSTEMI (non-ST elevated myocardial infarction) ([place]) 12/21/2015   Morbid obesity ([place]) 08/12/2015   Diabetes mellitus without complication ([place]) 12/78/7183   Essential hypertension 08/12/2015    Kerrie Pleasure, PT 07/12/2021, 11:03 AM  [facility] [address] [place], [place], [address] Phone: [phone]   Fax:  [phone]  Name: Courtney Stewart MRN: [id number] Date of Birth: Dec 04, 1970

## 2021-07-19 ENCOUNTER — Ambulatory Visit: Payer: 59 | Attending: Family Medicine | Admitting: Rehabilitation

## 2021-07-19 ENCOUNTER — Encounter: Payer: Self-pay | Admitting: Occupational Therapy

## 2021-07-19 ENCOUNTER — Other Ambulatory Visit: Payer: Self-pay

## 2021-07-19 ENCOUNTER — Encounter: Payer: Self-pay | Admitting: Rehabilitation

## 2021-07-19 ENCOUNTER — Ambulatory Visit: Payer: 59 | Admitting: Occupational Therapy

## 2021-07-19 DIAGNOSIS — M6281 Muscle weakness (generalized): Secondary | ICD-10-CM | POA: Diagnosis present

## 2021-07-19 DIAGNOSIS — R41840 Attention and concentration deficit: Secondary | ICD-10-CM

## 2021-07-19 DIAGNOSIS — R2689 Other abnormalities of gait and mobility: Secondary | ICD-10-CM

## 2021-07-19 DIAGNOSIS — R278 Other lack of coordination: Secondary | ICD-10-CM | POA: Diagnosis present

## 2021-07-19 DIAGNOSIS — R41844 Frontal lobe and executive function deficit: Secondary | ICD-10-CM | POA: Insufficient documentation

## 2021-07-19 DIAGNOSIS — R2681 Unsteadiness on feet: Secondary | ICD-10-CM | POA: Diagnosis present

## 2021-07-19 NOTE — Therapy (Signed)
[facility] [address] [place], [place], [address] Phone: [phone]   Fax:  [phone]  Physical Therapy Treatment and DC Summary   Patient Details  Name: Courtney Stewart MRN: [id number] Date of Birth: [birth year] Referring Provider (PT): Antony Contras, MD   Encounter Date: 07/19/2021   PT End of Session - 07/19/21 1454     Visit Number 5    Number of Visits 9    Authorization Type MCD pending    Authorization - Visit Number 4    PT Start Time 1104    PT Stop Time 1145    PT Time Calculation (min) 41 min    Equipment Utilized During Treatment Gait belt    Activity Tolerance Patient tolerated treatment well    Behavior During Therapy [facility] for tasks assessed/performed;Restless             Past Medical History:  Diagnosis Date   Anemia    Diabetes mellitus without complication ([place])    Hypertension    NSTEMI (non-ST elevated myocardial infarction) ([place])    UC (ulcerative colitis) ([place])     Past Surgical History:  Procedure Laterality Date   CARDIAC CATHETERIZATION N/A 12/22/2015   Procedure: Left Heart Cath and Coronary Angiography;  Surgeon: Adrian Prows, MD;  Location: [facility];  Service: Cardiovascular;  Laterality: N/A;   CESAREAN SECTION      There were no vitals filed for this visit.   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 Yes    Pain Score 3     Pain Location Knee    Pain Orientation Left    Pain Descriptors / Indicators Aching    Pain Type Acute pain    Pain Onset 1 to 4 weeks ago    Pain Frequency Intermittent    Aggravating Factors  weather    Pain Relieving Factors exercise                [facility] PT Assessment - 07/19/21 1122       Functional Gait  Assessment   Gait  assessed  Yes    Gait Level Surface Walks 20 ft, slow speed, abnormal gait pattern, evidence for imbalance or deviates 10-15 in outside of the 12 in walkway width. Requires more than 7 sec to ambulate 20 ft.   7.5 secs   Change in Gait Speed Able to smoothly change walking speed without loss of balance or gait deviation. Deviate no more than 6 in outside of the 12 in walkway width.    Gait with Horizontal Head Turns Performs head turns smoothly with no change in gait. Deviates no more than 6 in outside 12 in walkway width    Gait with Vertical Head Turns Performs head turns with no change in gait. Deviates no more than 6 in outside 12 in walkway width.    Gait and Pivot Turn Pivot turns safely within 3 sec and stops quickly with no loss of balance.    Step Over Obstacle Is able to step over 2 stacked shoe boxes taped together (9 in total height) without changing gait speed. No evidence of imbalance.    Gait with Narrow Base of Support Ambulates 4-7 steps.  Gait with Eyes Closed Walks 20 ft, uses assistive device, slower speed, mild gait deviations, deviates 6-10 in outside 12 in walkway width. Ambulates 20 ft in less than 9 sec but greater than 7 sec.    Ambulating Backwards Walks 20 ft, uses assistive device, slower speed, mild gait deviations, deviates 6-10 in outside 12 in walkway width.    Steps Alternating feet, must use rail.    Total Score 23    FGA comment: 19-24 = medium risk fall                           [facility] Adult PT Treatment/Exercise - 07/19/21 1122       Ambulation/Gait   Ambulation/Gait Yes    Ambulation/Gait Assistance 6: Modified independent (Device/Increase time)    Ambulation Distance (Feet) 300 Feet    Assistive device None    Gait Pattern Step-through pattern;Decreased stride length;Trunk flexed;Wide base of support    Ambulation Surface Level;Indoor    Gait velocity 3.20 ft/sec    Stairs Yes    Stairs Assistance 6: Modified independent  (Device/Increase time)    Stair Management Technique One rail Right;Alternating pattern;Forwards    Number of Stairs 8    Height of Stairs 6      Self-Care   Self-Care Other Self-Care Comments    Other Self-Care Comments  Lengthy discussion with pt regarding POC and D/C today vs adding more visits.  Note that she is still a medium fall risk but has not been compliant with exercises nor does she attend therapy consistently, therefore am hesitant to add more at this time.  She also endorses that she does not walk for exercise at home and at times uses a scooter if too fatigued.  Educated that endurance will not improve if she does not push into fatigue level daily.  Pt okay to D/C at this time and reports she will return in future if needed.      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  Had her push cart with weights x 115', move items from floor height and into upper cabinets as well as lifting/lowering weighted crate (15lbs) to/from floor/waist height.  cues for proper lifting technique.                     PT Education - 07/19/21 1454     Education Details see self care    Person(s) Educated Patient    Methods Explanation    Comprehension Verbalized understanding              PT Short Term Goals - 07/12/21 1053       PT SHORT TERM GOAL #1   Title Pt will initiate HEP in order to indicate improved functional mobility and dec fall risk.  (Target Date:  Following 3rd visit)    Baseline no HEP at this time    Time 1    Period Months    Status New      PT SHORT TERM GOAL #2   Title Pt will improve gait speed to >/=2.28 ft/sec in order to indicate improving community gait.    Baseline 1.98 ft/sec; 3.93 ft/s (07/12/21)    Time 1    Period Months  Status Achieved       PT SHORT TERM GOAL #3   Title Pt will negotiate up/down stairs with single rail in alternating fashion in order to indicate improved strength.    Baseline 4 with B rails in step to pattern; able to ascend with reciprocating    Time 1    Period Months    Status New               PT Long Term Goals - 07/19/21 1109       PT LONG TERM GOAL #1   Title Pt will  be IND with final HEP in order to indicate improved functional mobility and dec fall risk.  (Following 6th visit)    Baseline Pt reports doing them at home most days    Time 2    Period Months    Status Partially Met      PT LONG TERM GOAL #2   Title Pt will improve FGA to >/=22/30 in order to indicate dec fall risk.    Baseline 23/30    Time 2    Period Months    Status Achieved      PT LONG TERM GOAL #3   Title Pt will improve gait speed to >/=2.62 ft/sec in order to indicate safe community mobility.    Baseline 3.20 ft/sec on 07/19/21    Time 2    Period Months    Status Achieved      PT LONG TERM GOAL #4   Title Pt will ambulate x 500' over varying outdoor surfaces at mod I level in order to indicate safe community mobility.    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    Baseline met at S level with cues for lifting technique    Time 2    Period Months    Status Partially Met               PHYSICAL THERAPY DISCHARGE SUMMARY  Visits from Start of Care: 5  Current functional level related to goals / functional outcomes: See LTGs above   Remaining deficits: Pt with continued higher level balance and endurance deficits.    Education / Equipment: HEP    Patient agrees to discharge. Patient goals were partially met. Patient is being discharged due to being pleased with the current functional level.     Plan -  07/19/21 1455     Clinical Impression Statement Skilled session focused on assessment of LTGs for possible D/C. She has met 2/5, partially met 2/5 and did not get to assess outdoor goal due to time.  Pt agrees to DC at this time as she has not been consistent with attendance or performing HEP.  Educated that she may return in future if needed.    Personal Factors and Comorbidities Comorbidity 3+;Transportation;Finances    Comorbidities see above    Examination-Activity Limitations Locomotion Level;Stand;Stairs;Squat;Transfers    Examination-Participation Restrictions Community Activity;Driving;Personal Finances;Occupation    Stability/Clinical Decision Making Evolving/Moderate complexity    Rehab Potential Good    PT Frequency 1x / week   1x/wk for 8 weeks as MCD is pending   PT Duration 8 weeks    PT Treatment/Interventions ADLs/Self Care  Home Management;DME Instruction;Gait training;Stair training;Functional mobility training;Therapeutic activities;Therapeutic exercise;Balance training;Neuromuscular re-education;Patient/family education;Vestibular;Passive range of motion    PT Next Visit Plan --    Consulted and Agree with Plan of Care Patient             Patient will benefit from skilled therapeutic intervention in order to improve the following deficits and impairments:  Decreased activity tolerance, Decreased balance, Decreased mobility, Impaired flexibility, Postural dysfunction, Impaired sensation  Visit Diagnosis: Muscle weakness (generalized)  Unsteadiness on feet  Other abnormalities of gait and mobility     Problem List Patient Active Problem List   Diagnosis Date Noted   ICH (intracerebral hemorrhage) ([place]) 04/15/2021   Personal history of noncompliance with medical treatment, presenting hazards to health 11/16/2016   NSTEMI (non-ST elevated myocardial infarction) ([place]) 12/21/2015   Morbid obesity ([place]) 08/12/2015   Diabetes mellitus without complication ([place])  30/02/7948   Essential hypertension 08/12/2015    Cameron Sprang, PT, MPT [facility] [address] [place], [place], [address] Phone: [phone]   Fax:  [phone] 07/19/21, 3:12 PM   Name: Edelyn Heidel MRN: [id number] Date of Birth: 01-May-1971

## 2021-07-19 NOTE — Therapy (Signed)
[facility] [address] [place], [place], [address] Phone: [phone]   Fax:  [phone]  Occupational Therapy Treatment  Patient Details  Name: Courtney Stewart MRN: [id number] Date of Birth: [birth year] Referring Provider (OT): Leonie Man   Encounter Date: 07/19/2021   OT End of Session - 07/19/21 1055     Visit Number 6    Number of Visits 9    Date for OT Re-Evaluation 08/01/21   8 visits over 10 weeks d/t any scheduling conflicts   Authorization Type Self Pay - Medicaid Pending    OT Start Time 1027    OT Stop Time 1105    OT Time Calculation (min) 38 min    Activity Tolerance Patient tolerated treatment well;Patient limited by lethargy    Behavior During Therapy [facility] for tasks assessed/performed;Restless   pt extremely fatigued and lethargic            Past Medical History:  Diagnosis Date   Anemia    Diabetes mellitus without complication ([place])    Hypertension    NSTEMI (non-ST elevated myocardial infarction) ([place])    UC (ulcerative colitis) ([place])     Past Surgical History:  Procedure Laterality Date   CARDIAC CATHETERIZATION N/A 12/22/2015   Procedure: Left Heart Cath and Coronary Angiography;  Surgeon: Adrian Prows, MD;  Location: [facility];  Service: Cardiovascular;  Laterality: N/A;   CESAREAN SECTION      There were no vitals filed for this visit.   Subjective Assessment - 07/19/21 1053     Subjective  pt reports that she doesn't like to cook as much anymore--doesn't taste as good (dtr always home when she cooks as she continues to leave oven on at times--reviewed strategies); pt reports that she is doing everything else at home with incr time and more breaks except dtr doing financial management.  Pt reports desire to d/c therapy today--"I'm doing a lot better than I was when I started"    Pertinent History Anemia, DM, HTN, NSTEMI, ulcerative colitis    Limitations [facility] Transportation, Fall  Risk, mild left weakness    Patient Stated Goals "get back to driving"    Currently in Pain? No/denies              Checked goals and discussed progress.  Pt reports that she is performing HEP at home, has continued supervision for cooking, and that she feels ready to d/c today.  Standing for >15 min while performing functional reaching to place clothespins on vertical pole and removing for incr hand strength and activity tolerance, then ambulating/navigating busy environment and performing environmental scanning to locate objects in busy environment with 80% accuracy, remaining items found on 2nd pass without cueing.  Standing, placing small pegs in vertical pegboard to copy design for incr coordination and activity tolerance as well as cognitive task with 100% accuracy with incr time (pt able to maintain focus with intermittent conversation).        OT Education - 07/19/21 1513     Education Details Reviewed cognitive compensation strategies    Person(s) Educated Patient    Methods Explanation    Comprehension Verbalized understanding                 OT Long Term Goals - 07/19/21 1029       OT LONG TERM GOAL #1   Title Pt will be independent with HEP    Baseline would benefit from coordination and grip strength, proximal  strength    Time 10    Period Weeks    Status Achieved    Target Date 08/01/21      OT LONG TERM GOAL #2   Title Pt will perform physical and cognitive task simultaneously with 90% accuracy or greater    Time 10    Period Weeks    Status Not Met   07/19/21:  task dependent/not consistent     OT LONG TERM GOAL #3   Title Pt will increase grip strength by at least 5 lbs in LUE.    Baseline R 40.3, L 29.3    Time 10    Period Weeks    Status Achieved   07/19/21:  36.1lbs     OT LONG TERM GOAL #4   Title Pt will complete 9 hole peg test with LUE in 5 seconds faster than at evaluation    Baseline R 23.54s, L 33.47s    Time 10    Period Weeks     Status Not Met   07/19/21:  29.88sec (improved by 3.59sec)     OT LONG TERM GOAL #5   Title Pt will verbalize understanding of adapted strategies and/or equipment for increase safety and independence with ADLs and IADLs.    Time 10    Period Weeks    Status Achieved      OT LONG TERM GOAL #6   Title Pt will increase activity tolerance by completed 15 minutes or more of home management or cooking tasks per report or in clinic assessment without req'ing seated rest break.    Time 10    Period Weeks    Status Achieved   07/19/21:  63mn per pt report, but able to demo > 169m during session                  Plan - 07/19/21 1040     Clinical Impression Statement Pt reports that she is ready to d/c today.  Pt reports that she feels that she has improved.    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 / Physical Skills FMC;Coordination;Endurance;Strength;ADL;Decreased knowledge of use of DME;Balance;Dexterity;GMC;Pain;UE functional use;IADL    Cognitive Skills Attention;Safety Awareness;Memory    Rehab Potential Good    Clinical Decision Making Limited treatment options, no task modification necessary    Comorbidities Affecting Occupational Performance: None    Modification or Assistance to Complete Evaluation  No modification of tasks or assist necessary to complete eval    OT Frequency 1x / week    OT Duration Other (comment)   8 visits over 10 weeks d/t any scheduling conflicts   OT Treatment/Interventions Patient/family education;Energy conservation;Functional Mobility Training;Passive range of motion;Therapeutic exercise;Cognitive remediation/compensation;Therapeutic activities;DME and/or AE instruction;Self-care/ADL training;Moist Heat;Neuromuscular education    Plan  d/c OT    Consulted and Agree with Plan of Care Patient             Patient will benefit from skilled therapeutic intervention in order to improve the following deficits and impairments:   Body Structure / Function / Physical Skills: [facility], IncCoordination, Endurance, Strength, ADL, Decreased knowledge of use of DME, Balance, Dexterity, GMC, Pain, UE functional use, IADL Cognitive Skills: Attention, Safety Awareness, Memory     Visit Diagnosis: Other lack of coordination  Frontal lobe and executive function deficit  Attention and  concentration deficit  Muscle weakness (generalized)  Unsteadiness on feet  Other abnormalities of gait and mobility    Problem List Patient Active Problem List   Diagnosis Date Noted   ICH (intracerebral hemorrhage) ([place]) 04/15/2021   Personal history of noncompliance with medical treatment, presenting hazards to health 11/16/2016   NSTEMI (non-ST elevated myocardial infarction) ([place]) 12/21/2015   Morbid obesity ([place]) 08/12/2015   Diabetes mellitus without complication ([place]) 52/47/9980   Essential hypertension 08/12/2015     OCCUPATIONAL THERAPY DISCHARGE SUMMARY  Visits from Start of Care: 6  Current functional level related to goals / functional outcomes: See above   Remaining deficits: Cognitive deficits, decr activity tolerance, mild decr coordination L hand    Education / Equipment: Pt instructed in HEP (for proximal/hand strength, coordination, and cognition), cognitive compensation strategies for ADLs/IADLs.  Pt verbalized understanding of all education provided.   Patient agrees to discharge. Patient goals were partially met. Patient is being discharged due to being pleased with the current functional level.Marland Kitchen     [facility], [place] 07/19/2021, 3:15 PM  [facility] [address] [place], [place], [address] Phone: [phone]   Fax:  [phone]  Name: Melisha Eggleton MRN: [id number] Date of Birth: [birth year]  Vianne Bulls, OTR/L [facility] [address] [place], [place]  [address] [phone] phone [phone] 07/19/21 3:24 PM

## 2022-08-19 IMAGING — CT CT ANGIO HEAD-NECK (W OR W/O PERF)
2 of 7 series · 8 of 33 positions shown · IV contrast (OMNI 350)
Comparison: None.

CLINICAL DATA: Left facial droop and left arm weakness

EXAM:
CT ANGIOGRAPHY HEAD AND NECK
TECHNIQUE: Multidetector CT imaging of the head and neck was performed using
the standard protocol during bolus administration of intravenous
contrast. Multiplanar CT image reconstructions and MIPs were
obtained to evaluate the vascular anatomy. Carotid stenosis
measurements (when applicable) are obtained utilizing NASCET
criteria, using the distal internal carotid diameter as the
denominator.
CONTRAST:  75mL OMNIPAQUE IOHEXOL 350 MG/ML SOLN

[Series 5: cta neck (person_name) · axial · 0.51mm/px · z∈[-272,-150]mm · 2 of 183 slices shown]
[im 61/183  soft-tissue]
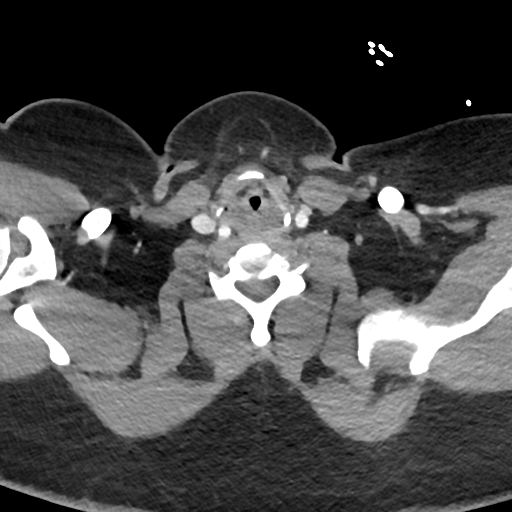
[im 122/183  soft-tissue]
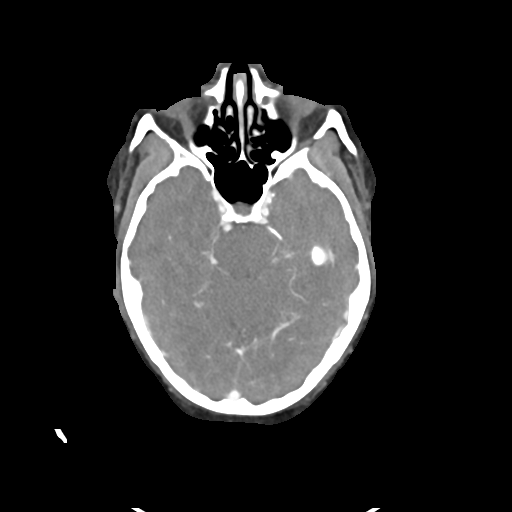

[Series 9: cta neck axial · axial · 0.39mm/px · z∈[-341,-83]mm · 6 of 362 slices shown]
[im 52/362  soft-tissue]
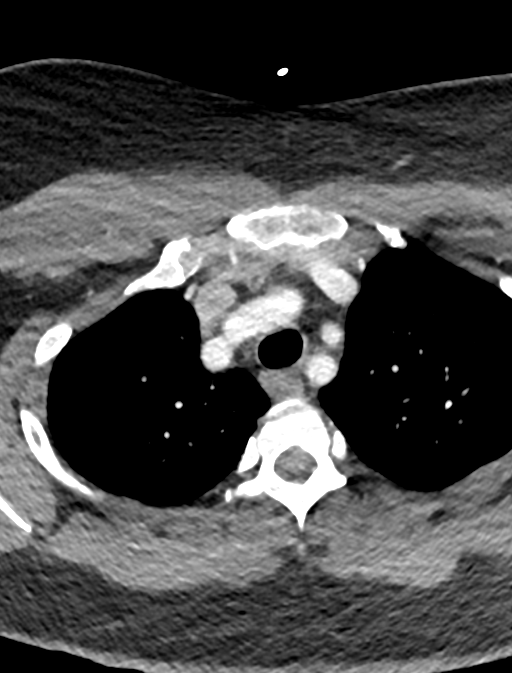
[im 104/362  bone]
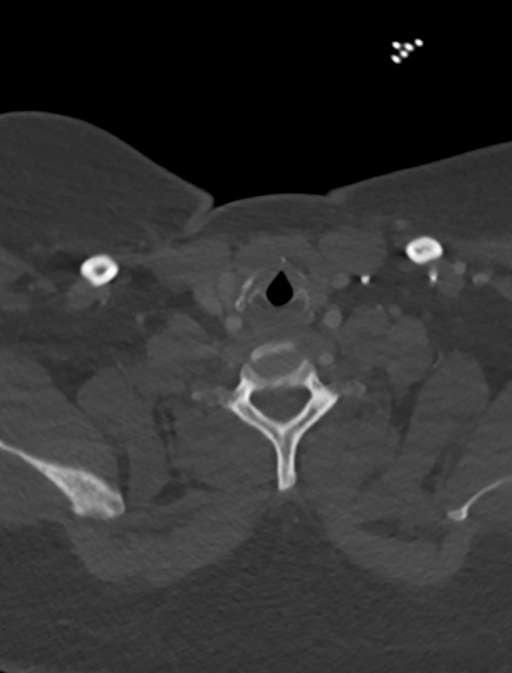
[im 155/362  soft-tissue]
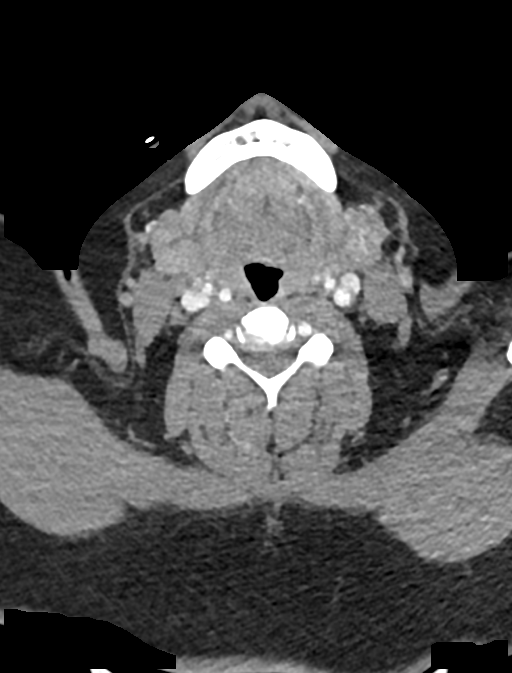
[im 207/362  bone]
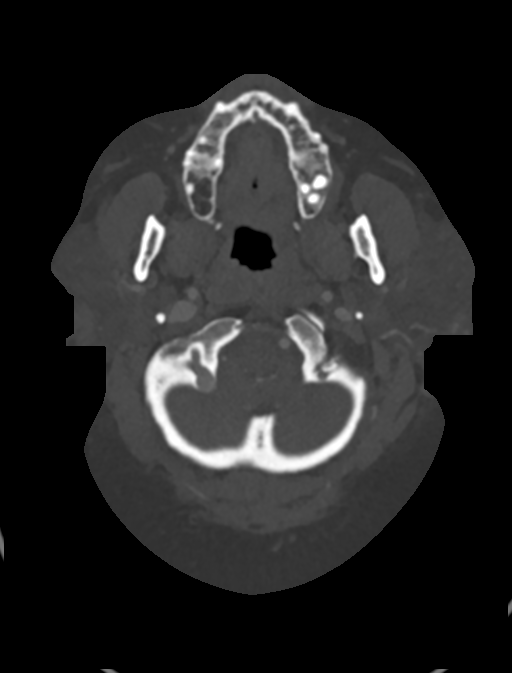
[im 258/362  soft-tissue]
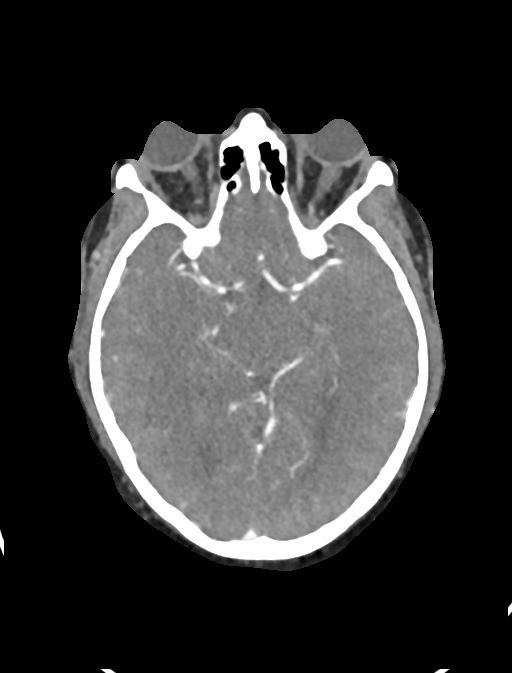
[im 310/362  bone]
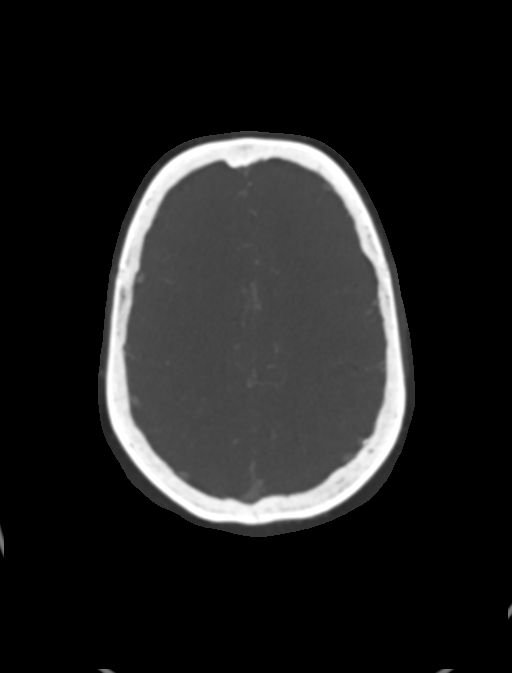

[8 of 33 positions shown; findings below may reference images not displayed]

FINDINGS: CTA NECK FINDINGS

SKELETON: There is no bony spinal canal stenosis. No lytic or
blastic lesion.

OTHER NECK: Normal pharynx, larynx and major salivary glands. No
cervical lymphadenopathy. Unremarkable thyroid gland.

UPPER CHEST: No pneumothorax or pleural effusion. No nodules or
masses.

AORTIC ARCH:

There is no calcific atherosclerosis of the aortic arch. There is no
aneurysm, dissection or hemodynamically significant stenosis of the
visualized portion of the aorta. Conventional 3 vessel aortic
branching pattern. The visualized proximal subclavian arteries are
widely patent.

RIGHT CAROTID SYSTEM: Normal without aneurysm, dissection or
stenosis.

LEFT CAROTID SYSTEM: Normal without aneurysm, dissection or
stenosis.

VERTEBRAL ARTERIES: Left dominant configuration. Both origins are
clearly patent. There is no dissection, occlusion or flow-limiting
stenosis to the skull base (V1-V3 segments).

CTA HEAD FINDINGS

POSTERIOR CIRCULATION:

--Vertebral arteries: Normal V4 segments.

--Inferior cerebellar arteries: Normal.

--Basilar artery: Normal.

--Superior cerebellar arteries: Normal.

--Posterior cerebral arteries (PCA): Normal.

ANTERIOR CIRCULATION:

--Intracranial internal carotid arteries: Normal.

--Anterior cerebral arteries (ACA): Normal. Both A1 segments are
present. Patent anterior communicating artery (a-comm).

--Middle cerebral arteries (MCA): Normal.

VENOUS SINUSES: As permitted by contrast timing, patent.

ANATOMIC VARIANTS: None

Review of the MIP images confirms the above findings.
IMPRESSION: Normal CTA of the head and neck.

## 2022-08-19 IMAGING — CT CT HEAD CODE STROKE
4 series · 16 of 47 positions shown, 18 images · non-contrast
Comparison: CT head 01/23/2021

CLINICAL DATA: Code stroke.  Acute neuro deficit.

EXAM:
CT HEAD WITHOUT CONTRAST
TECHNIQUE: Contiguous axial images were obtained from the base of the skull
through the vertex without intravenous contrast.

[Series 2: head wo · axial · 0.44mm/px · z∈[+1222,+1352]mm · 7 of 36 slices shown, 9 images]
[im 5/36  brain]
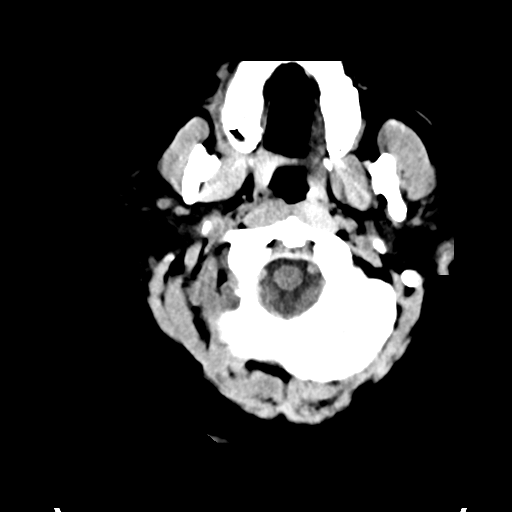
[im 5/36  bone]
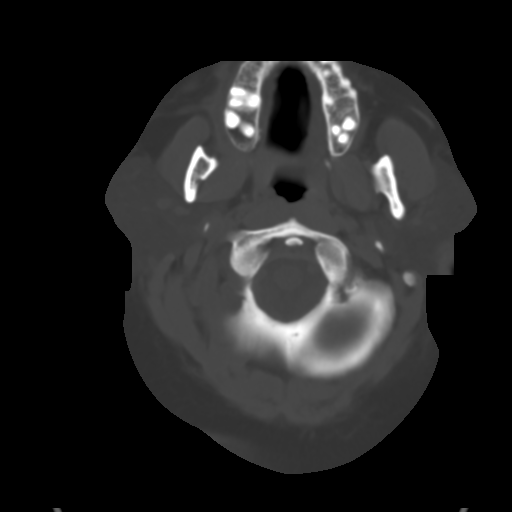
[im 9/36  brain]
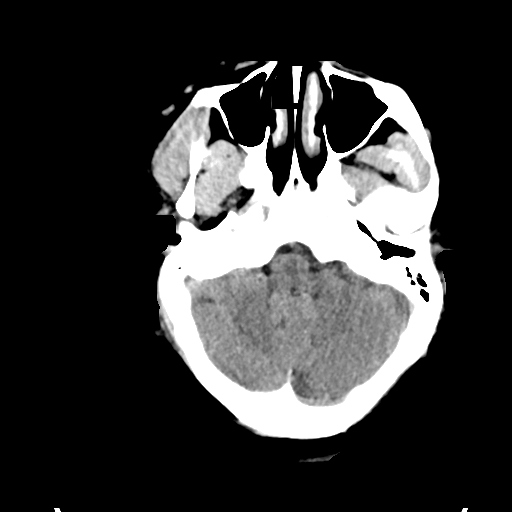
[im 14/36  brain]
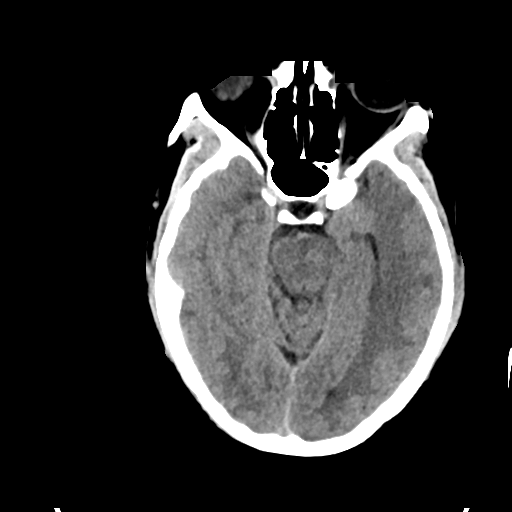
[im 18/36  brain]
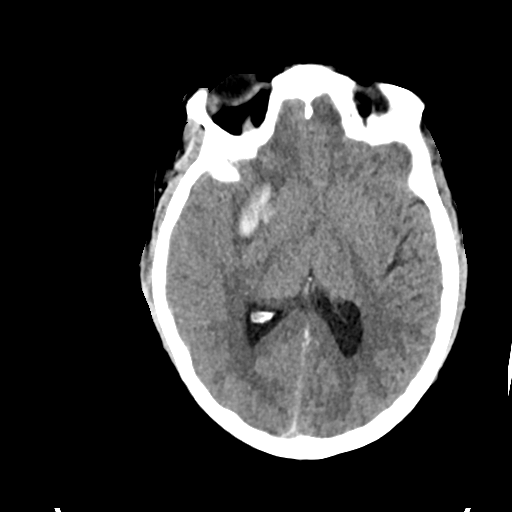
[im 22/36  brain]
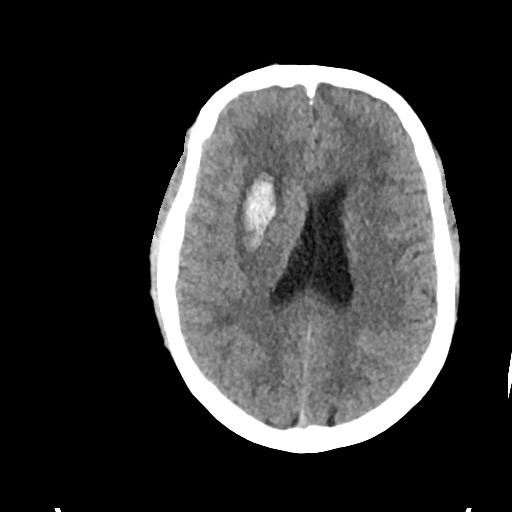
[im 22/36  bone]
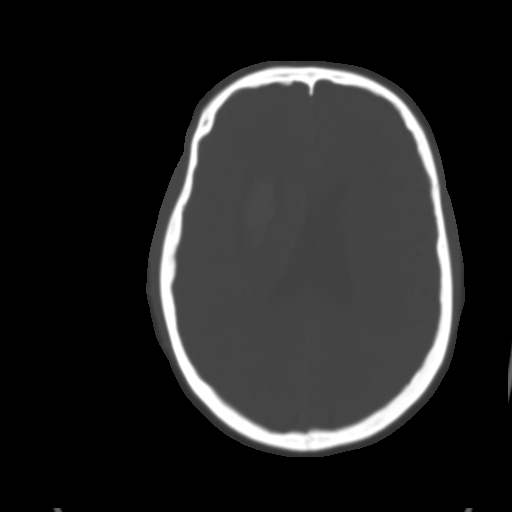
[im 27/36  brain]
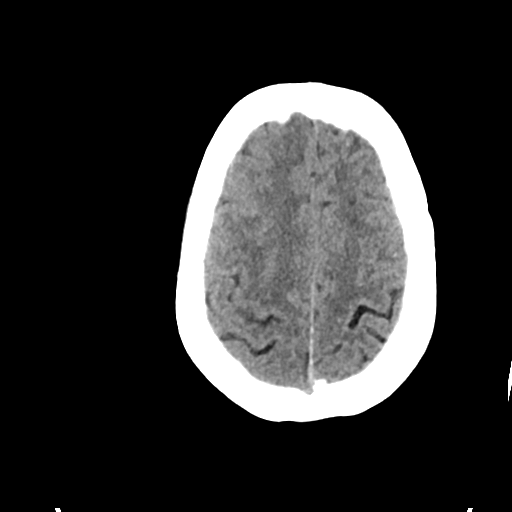
[im 31/36  brain]
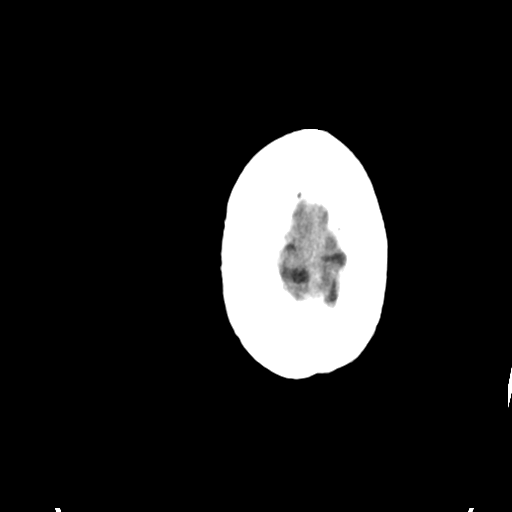

[Series 4: head bone · axial · 0.44mm/px · z∈[+1218,+1254]mm · 3 of 89 slices shown]
[im 9/89  bone]
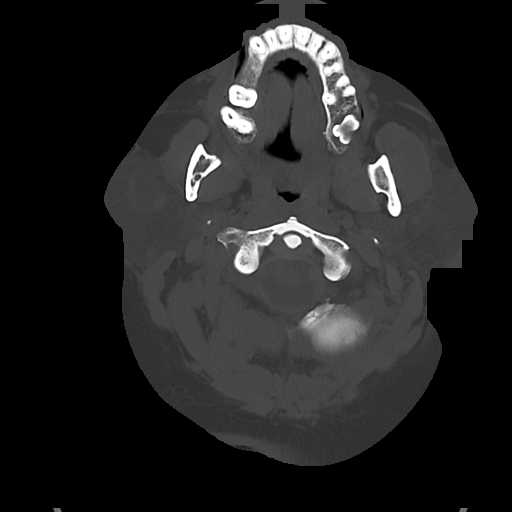
[im 18/89  bone]
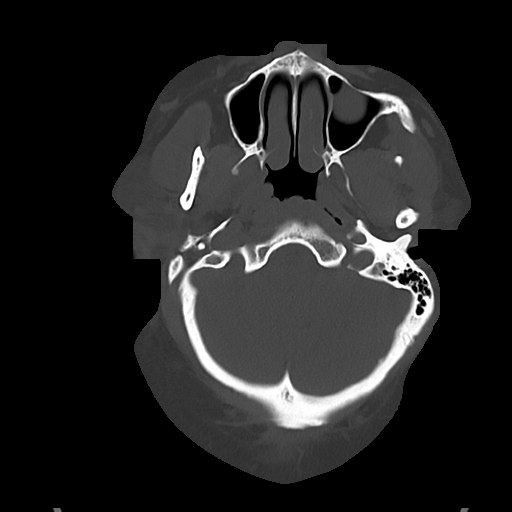
[im 27/89  bone]
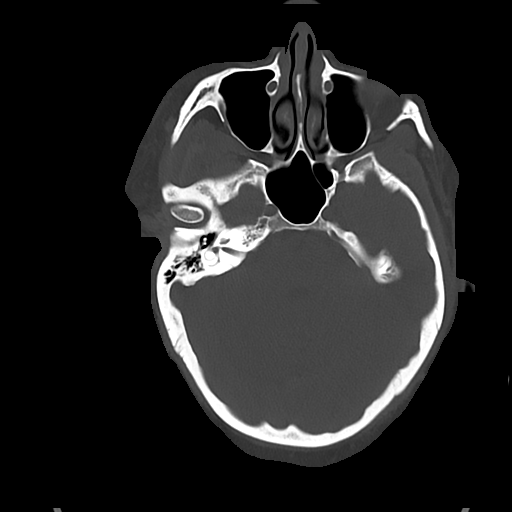

[Series 5: cor soft · coronal · 0.37mm/px · 3 of 89 slices shown]
[im 30/89  brain]
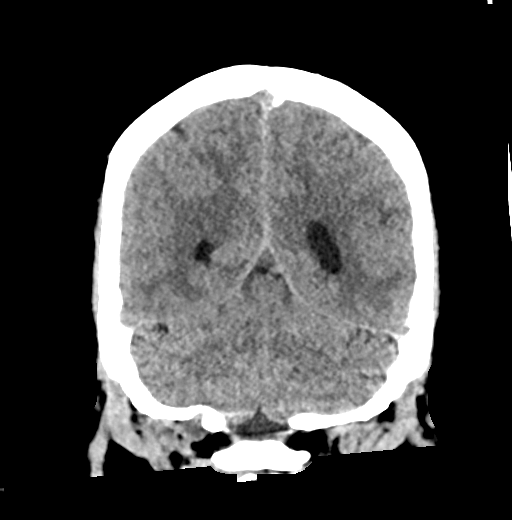
[im 40/89  brain]
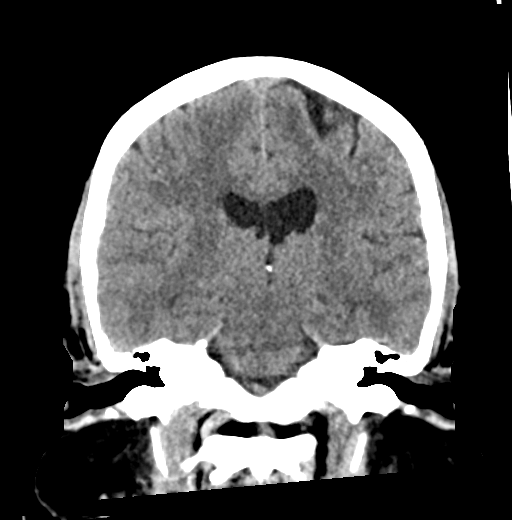
[im 49/89  brain]
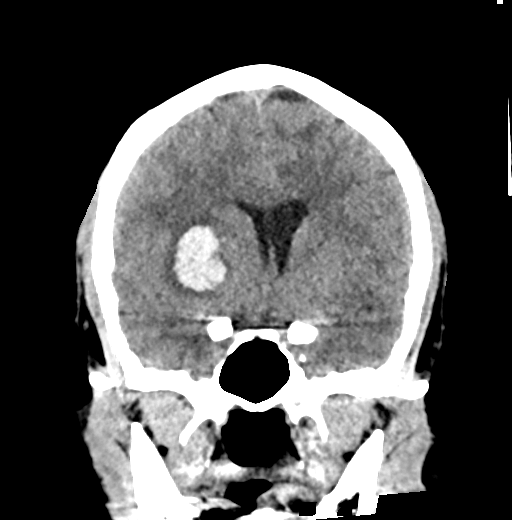

[Series 6: sag soft · sagittal · 0.37mm/px · 3 of 63 slices shown]
[im 21/63  brain]
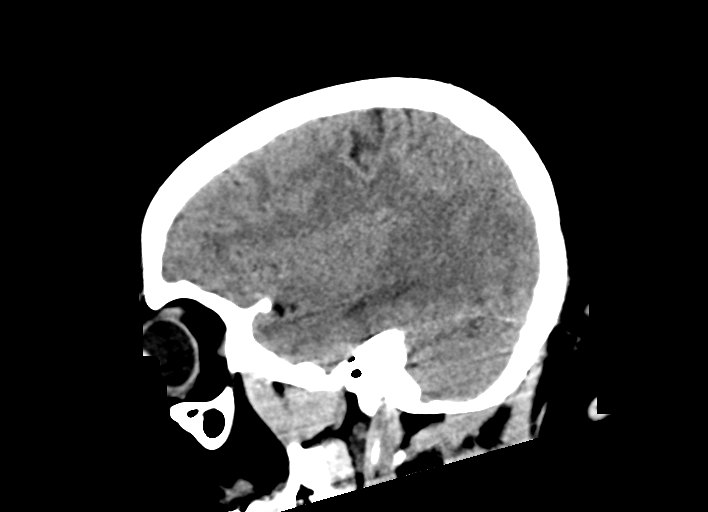
[im 32/63  brain]
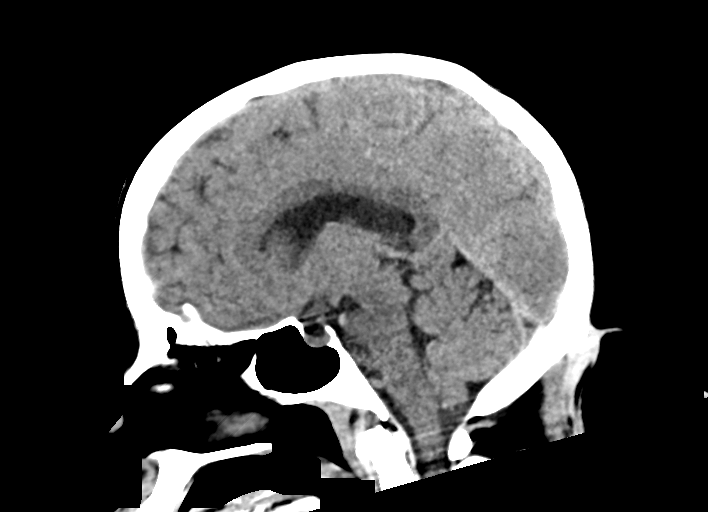
[im 42/63  brain]
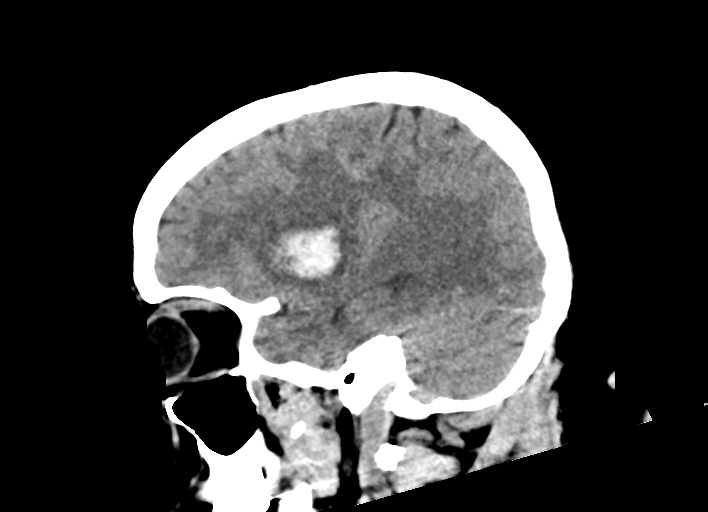

[16 of 47 positions shown; findings below may reference images not displayed]

FINDINGS: Brain: Acute hemorrhage right basal ganglia measuring 3.7 x 14 x 23
mm. This is diffusely high density with mild surrounding edema.
Hematoma volume 6 mL of blood. There is mass-effect on the right
lateral ventricle with 5 mm midline shift to the left.

Patchy hypodensity in the frontal white matter bilaterally similar
to the prior study and consistent with chronic microvascular
ischemia.

Vascular: Negative for hyperdense vessel

Skull: Negative

Sinuses/Orbits: Retention cyst left maxillary sinus.  Normal orbit

Other: None

ASPECTS ([place]ta Stroke Program Early CT Score)

Aspects not calculated due to intracranial hemorrhage.
IMPRESSION: 1. Acute hemorrhage right basal ganglia likely due to hypertension.
Mild surrounding edema. Mass-effect on the right lateral ventricle
with 5 mm midline shift to the left.
2. Chronic microvascular ischemic change in the white matter.
3. These results were called by telephone at the time of
interpretation on 04/15/2021 at [DATE] to provider CALEB AUJLA ,
who verbally acknowledged these results.
